# Patient Record
Sex: Female | Born: 1950 | Race: White | Hispanic: No | Marital: Married | Smoking: Former smoker
Health system: Southern US, Community
[De-identification: ages and names within clinical notes are randomized; demographics above are authoritative.]

## PROBLEM LIST (undated history)

## (undated) DIAGNOSIS — C4490 Unspecified malignant neoplasm of skin, unspecified: Secondary | ICD-10-CM

## (undated) DIAGNOSIS — S83249A Other tear of medial meniscus, current injury, unspecified knee, initial encounter: Secondary | ICD-10-CM

## (undated) DIAGNOSIS — S83289A Other tear of lateral meniscus, current injury, unspecified knee, initial encounter: Secondary | ICD-10-CM

## (undated) DIAGNOSIS — I499 Cardiac arrhythmia, unspecified: Secondary | ICD-10-CM

## (undated) DIAGNOSIS — I10 Essential (primary) hypertension: Secondary | ICD-10-CM

## (undated) DIAGNOSIS — D649 Anemia, unspecified: Secondary | ICD-10-CM

## (undated) DIAGNOSIS — R112 Nausea with vomiting, unspecified: Secondary | ICD-10-CM

## (undated) DIAGNOSIS — Z8489 Family history of other specified conditions: Secondary | ICD-10-CM

## (undated) DIAGNOSIS — G4730 Sleep apnea, unspecified: Secondary | ICD-10-CM

## (undated) DIAGNOSIS — E7800 Pure hypercholesterolemia, unspecified: Secondary | ICD-10-CM

## (undated) DIAGNOSIS — K219 Gastro-esophageal reflux disease without esophagitis: Secondary | ICD-10-CM

## (undated) DIAGNOSIS — F32A Depression, unspecified: Secondary | ICD-10-CM

## (undated) DIAGNOSIS — I493 Ventricular premature depolarization: Secondary | ICD-10-CM

## (undated) DIAGNOSIS — Z9889 Other specified postprocedural states: Secondary | ICD-10-CM

## (undated) DIAGNOSIS — M1990 Unspecified osteoarthritis, unspecified site: Secondary | ICD-10-CM

## (undated) HISTORY — PX: ESOPHAGEAL DILATION: SHX303

## (undated) HISTORY — PX: BLEPHAROPLASTY: SUR158

## (undated) HISTORY — PX: BREAST BIOPSY: SHX20

## (undated) HISTORY — PX: BREAST REDUCTION SURGERY: SHX8

## (undated) HISTORY — PX: EXCISION MORTON'S NEUROMA: SHX5013

## (undated) HISTORY — PX: SKIN CANCER EXCISION: SHX779

## (undated) HISTORY — PX: DIAGNOSTIC LAPAROSCOPY: SUR761

## (undated) HISTORY — PX: REDUCTION MAMMAPLASTY: SUR839

## (undated) HISTORY — PX: APPENDECTOMY: SHX54

---

## 1998-11-28 ENCOUNTER — Encounter: Payer: Self-pay | Admitting: Gastroenterology

## 1998-11-28 ENCOUNTER — Ambulatory Visit (HOSPITAL_COMMUNITY): Admission: RE | Admit: 1998-11-28 | Discharge: 1998-11-28 | Payer: Self-pay | Admitting: Gastroenterology

## 1999-10-02 ENCOUNTER — Other Ambulatory Visit: Admission: RE | Admit: 1999-10-02 | Discharge: 1999-10-02 | Payer: Self-pay | Admitting: Obstetrics and Gynecology

## 1999-10-09 ENCOUNTER — Encounter: Admission: RE | Admit: 1999-10-09 | Discharge: 1999-10-09 | Payer: Self-pay | Admitting: Obstetrics and Gynecology

## 1999-10-09 ENCOUNTER — Encounter: Payer: Self-pay | Admitting: Obstetrics and Gynecology

## 2000-04-03 ENCOUNTER — Encounter: Payer: Self-pay | Admitting: Obstetrics and Gynecology

## 2000-04-03 ENCOUNTER — Encounter: Admission: RE | Admit: 2000-04-03 | Discharge: 2000-04-03 | Payer: Self-pay | Admitting: Obstetrics and Gynecology

## 2000-07-19 ENCOUNTER — Encounter: Payer: Self-pay | Admitting: Neurosurgery

## 2000-07-19 ENCOUNTER — Ambulatory Visit (HOSPITAL_COMMUNITY): Admission: RE | Admit: 2000-07-19 | Discharge: 2000-07-19 | Payer: Self-pay | Admitting: Neurosurgery

## 2000-11-07 ENCOUNTER — Encounter: Payer: Self-pay | Admitting: Obstetrics and Gynecology

## 2000-11-07 ENCOUNTER — Encounter: Admission: RE | Admit: 2000-11-07 | Discharge: 2000-11-07 | Payer: Self-pay | Admitting: Obstetrics and Gynecology

## 2000-11-11 ENCOUNTER — Encounter: Payer: Self-pay | Admitting: Obstetrics and Gynecology

## 2000-11-11 ENCOUNTER — Encounter: Admission: RE | Admit: 2000-11-11 | Discharge: 2000-11-11 | Payer: Self-pay | Admitting: Obstetrics and Gynecology

## 2000-11-11 ENCOUNTER — Other Ambulatory Visit: Admission: RE | Admit: 2000-11-11 | Discharge: 2000-11-11 | Payer: Self-pay | Admitting: Obstetrics and Gynecology

## 2001-01-20 ENCOUNTER — Other Ambulatory Visit: Admission: RE | Admit: 2001-01-20 | Discharge: 2001-01-20 | Payer: Self-pay | Admitting: Obstetrics and Gynecology

## 2001-02-24 ENCOUNTER — Ambulatory Visit (HOSPITAL_BASED_OUTPATIENT_CLINIC_OR_DEPARTMENT_OTHER): Admission: RE | Admit: 2001-02-24 | Discharge: 2001-02-24 | Payer: Self-pay | Admitting: General Surgery

## 2002-03-29 ENCOUNTER — Other Ambulatory Visit: Admission: RE | Admit: 2002-03-29 | Discharge: 2002-03-29 | Payer: Self-pay | Admitting: Obstetrics and Gynecology

## 2002-07-01 HISTORY — PX: REDUCTION MAMMAPLASTY: SUR839

## 2002-12-23 ENCOUNTER — Encounter: Payer: Self-pay | Admitting: Orthopaedic Surgery

## 2002-12-23 ENCOUNTER — Ambulatory Visit (HOSPITAL_COMMUNITY): Admission: RE | Admit: 2002-12-23 | Discharge: 2002-12-23 | Payer: Self-pay | Admitting: Orthopaedic Surgery

## 2003-01-13 ENCOUNTER — Ambulatory Visit (HOSPITAL_BASED_OUTPATIENT_CLINIC_OR_DEPARTMENT_OTHER): Admission: RE | Admit: 2003-01-13 | Discharge: 2003-01-13 | Payer: Self-pay | Admitting: Orthopaedic Surgery

## 2003-07-08 ENCOUNTER — Ambulatory Visit (HOSPITAL_COMMUNITY): Admission: RE | Admit: 2003-07-08 | Discharge: 2003-07-08 | Payer: Self-pay | Admitting: Obstetrics and Gynecology

## 2003-12-16 ENCOUNTER — Ambulatory Visit (HOSPITAL_COMMUNITY): Admission: RE | Admit: 2003-12-16 | Discharge: 2003-12-16 | Payer: Self-pay | Admitting: Gastroenterology

## 2004-07-05 ENCOUNTER — Ambulatory Visit (HOSPITAL_COMMUNITY): Admission: RE | Admit: 2004-07-05 | Discharge: 2004-07-05 | Payer: Self-pay | Admitting: Plastic Surgery

## 2004-07-11 ENCOUNTER — Ambulatory Visit (HOSPITAL_COMMUNITY): Admission: RE | Admit: 2004-07-11 | Discharge: 2004-07-11 | Payer: Self-pay | Admitting: Plastic Surgery

## 2004-07-11 ENCOUNTER — Ambulatory Visit (HOSPITAL_BASED_OUTPATIENT_CLINIC_OR_DEPARTMENT_OTHER): Admission: RE | Admit: 2004-07-11 | Discharge: 2004-07-11 | Payer: Self-pay | Admitting: Plastic Surgery

## 2005-02-26 ENCOUNTER — Other Ambulatory Visit: Admission: RE | Admit: 2005-02-26 | Discharge: 2005-02-26 | Payer: Self-pay | Admitting: Obstetrics and Gynecology

## 2006-06-16 ENCOUNTER — Encounter: Admission: RE | Admit: 2006-06-16 | Discharge: 2006-06-16 | Payer: Self-pay | Admitting: Obstetrics and Gynecology

## 2010-07-22 ENCOUNTER — Encounter: Payer: Self-pay | Admitting: Obstetrics and Gynecology

## 2012-11-04 ENCOUNTER — Other Ambulatory Visit: Payer: Self-pay | Admitting: *Deleted

## 2012-11-04 DIAGNOSIS — N6310 Unspecified lump in the right breast, unspecified quadrant: Secondary | ICD-10-CM

## 2012-11-16 ENCOUNTER — Other Ambulatory Visit: Payer: Self-pay | Admitting: *Deleted

## 2012-11-16 ENCOUNTER — Ambulatory Visit
Admission: RE | Admit: 2012-11-16 | Discharge: 2012-11-16 | Disposition: A | Discharge status: Home / Self Care | Payer: BC Managed Care – PPO | Source: Ambulatory Visit | Attending: *Deleted | Admitting: *Deleted

## 2012-11-16 DIAGNOSIS — R921 Mammographic calcification found on diagnostic imaging of breast: Secondary | ICD-10-CM

## 2012-11-16 DIAGNOSIS — N6310 Unspecified lump in the right breast, unspecified quadrant: Secondary | ICD-10-CM

## 2012-11-17 ENCOUNTER — Ambulatory Visit
Admission: RE | Admit: 2012-11-17 | Discharge: 2012-11-17 | Disposition: A | Discharge status: Home / Self Care | Payer: BC Managed Care – PPO | Source: Ambulatory Visit | Attending: *Deleted | Admitting: *Deleted

## 2012-11-17 DIAGNOSIS — R921 Mammographic calcification found on diagnostic imaging of breast: Secondary | ICD-10-CM

## 2013-02-24 ENCOUNTER — Other Ambulatory Visit: Payer: Self-pay

## 2013-02-24 DIAGNOSIS — Z1231 Encounter for screening mammogram for malignant neoplasm of breast: Secondary | ICD-10-CM

## 2013-03-11 ENCOUNTER — Ambulatory Visit
Admission: RE | Admit: 2013-03-11 | Discharge: 2013-03-11 | Disposition: A | Discharge status: Home / Self Care | Payer: BC Managed Care – PPO | Source: Ambulatory Visit

## 2013-03-11 DIAGNOSIS — Z1231 Encounter for screening mammogram for malignant neoplasm of breast: Secondary | ICD-10-CM

## 2013-09-29 ENCOUNTER — Other Ambulatory Visit: Payer: Self-pay | Admitting: Obstetrics & Gynecology

## 2013-09-29 ENCOUNTER — Other Ambulatory Visit (HOSPITAL_COMMUNITY)
Admission: RE | Admit: 2013-09-29 | Discharge: 2013-09-29 | Disposition: A | Discharge status: Home / Self Care | Payer: BC Managed Care – PPO | Source: Ambulatory Visit | Attending: Obstetrics & Gynecology | Admitting: Obstetrics & Gynecology

## 2013-09-29 DIAGNOSIS — Z01419 Encounter for gynecological examination (general) (routine) without abnormal findings: Secondary | ICD-10-CM | POA: Insufficient documentation

## 2013-09-29 DIAGNOSIS — Z1151 Encounter for screening for human papillomavirus (HPV): Secondary | ICD-10-CM | POA: Insufficient documentation

## 2014-05-23 ENCOUNTER — Other Ambulatory Visit (HOSPITAL_COMMUNITY): Payer: Self-pay | Admitting: Internal Medicine

## 2014-05-23 DIAGNOSIS — R109 Unspecified abdominal pain: Secondary | ICD-10-CM

## 2014-05-24 ENCOUNTER — Ambulatory Visit (HOSPITAL_COMMUNITY)
Admission: RE | Admit: 2014-05-24 | Discharge: 2014-05-24 | Disposition: A | Discharge status: Home / Self Care | Payer: BC Managed Care – PPO | Source: Ambulatory Visit | Attending: Internal Medicine | Admitting: Internal Medicine

## 2014-05-24 DIAGNOSIS — R109 Unspecified abdominal pain: Secondary | ICD-10-CM

## 2014-08-05 ENCOUNTER — Other Ambulatory Visit: Payer: Self-pay | Admitting: Hand Surgery

## 2014-08-05 ENCOUNTER — Other Ambulatory Visit: Payer: Self-pay | Admitting: Geriatric Medicine

## 2014-08-05 DIAGNOSIS — R109 Unspecified abdominal pain: Secondary | ICD-10-CM

## 2014-08-10 ENCOUNTER — Other Ambulatory Visit (HOSPITAL_COMMUNITY): Payer: Self-pay | Admitting: Internal Medicine

## 2014-08-10 DIAGNOSIS — R1013 Epigastric pain: Secondary | ICD-10-CM

## 2014-08-11 ENCOUNTER — Other Ambulatory Visit: Payer: Self-pay

## 2014-08-17 ENCOUNTER — Ambulatory Visit (HOSPITAL_COMMUNITY)
Admission: RE | Admit: 2014-08-17 | Discharge: 2014-08-17 | Disposition: A | Discharge status: Home / Self Care | Payer: BLUE CROSS/BLUE SHIELD | Source: Ambulatory Visit | Attending: Internal Medicine | Admitting: Internal Medicine

## 2014-08-17 DIAGNOSIS — R1013 Epigastric pain: Secondary | ICD-10-CM | POA: Insufficient documentation

## 2014-08-17 MED ORDER — STERILE WATER FOR INJECTION IJ SOLN
INTRAMUSCULAR | Status: AC
  Filled 2014-08-17: qty 10

## 2014-08-17 MED ORDER — SINCALIDE 5 MCG IJ SOLR
INTRAMUSCULAR | Status: AC
  Administered 2014-08-17: 2.2 ug via INTRAVENOUS
  Filled 2014-08-17: qty 5

## 2014-08-17 MED ORDER — TECHNETIUM TC 99M MEBROFENIN IV KIT: 5.0000 | PACK | Freq: Once | INTRAVENOUS | Status: AC | PRN

## 2014-08-17 MED ORDER — SINCALIDE 5 MCG IJ SOLR: 0.0200 ug/kg | Freq: Once | INTRAMUSCULAR | Status: AC

## 2014-12-08 ENCOUNTER — Other Ambulatory Visit: Payer: Self-pay | Admitting: Obstetrics & Gynecology

## 2014-12-08 DIAGNOSIS — N6313 Unspecified lump in the right breast, lower outer quadrant: Secondary | ICD-10-CM

## 2014-12-12 ENCOUNTER — Ambulatory Visit
Admission: RE | Admit: 2014-12-12 | Discharge: 2014-12-12 | Disposition: A | Discharge status: Home / Self Care | Payer: BLUE CROSS/BLUE SHIELD | Source: Ambulatory Visit | Attending: Obstetrics & Gynecology | Admitting: Obstetrics & Gynecology

## 2014-12-12 ENCOUNTER — Other Ambulatory Visit: Payer: BLUE CROSS/BLUE SHIELD

## 2014-12-12 DIAGNOSIS — N6313 Unspecified lump in the right breast, lower outer quadrant: Secondary | ICD-10-CM

## 2015-07-26 ENCOUNTER — Other Ambulatory Visit (HOSPITAL_COMMUNITY): Payer: Self-pay | Admitting: Nurse Practitioner

## 2015-07-26 DIAGNOSIS — R200 Anesthesia of skin: Secondary | ICD-10-CM

## 2015-07-27 ENCOUNTER — Ambulatory Visit (HOSPITAL_COMMUNITY)
Admission: RE | Admit: 2015-07-27 | Discharge: 2015-07-27 | Disposition: A | Discharge status: Home / Self Care | Payer: 59 | Source: Ambulatory Visit | Attending: Internal Medicine | Admitting: Internal Medicine

## 2015-07-27 ENCOUNTER — Other Ambulatory Visit (HOSPITAL_COMMUNITY): Payer: Self-pay | Admitting: Nurse Practitioner

## 2015-07-27 DIAGNOSIS — I6523 Occlusion and stenosis of bilateral carotid arteries: Secondary | ICD-10-CM | POA: Insufficient documentation

## 2015-07-27 DIAGNOSIS — R208 Other disturbances of skin sensation: Secondary | ICD-10-CM | POA: Diagnosis not present

## 2015-07-27 DIAGNOSIS — R200 Anesthesia of skin: Secondary | ICD-10-CM | POA: Diagnosis present

## 2015-07-27 NOTE — Progress Notes (Signed)
VASCULAR LAB PRELIMINARY  PRELIMINARY  PRELIMINARY  PRELIMINARY  Carotid duplex completed.    Preliminary report:  1-39% ICA plaquing.  Vertebral artery flow is antegrade.   Anitra Doxtater, RVT 07/27/2015, 10:51 AM

## 2015-07-31 ENCOUNTER — Ambulatory Visit (INDEPENDENT_AMBULATORY_CARE_PROVIDER_SITE_OTHER): Payer: 59

## 2015-07-31 DIAGNOSIS — R002 Palpitations: Secondary | ICD-10-CM | POA: Diagnosis not present

## 2015-07-31 DIAGNOSIS — I493 Ventricular premature depolarization: Secondary | ICD-10-CM | POA: Diagnosis not present

## 2015-07-31 DIAGNOSIS — R000 Tachycardia, unspecified: Secondary | ICD-10-CM | POA: Insufficient documentation

## 2015-08-09 ENCOUNTER — Ambulatory Visit (HOSPITAL_COMMUNITY): Payer: 59

## 2015-08-09 ENCOUNTER — Ambulatory Visit (HOSPITAL_COMMUNITY)
Admission: RE | Admit: 2015-08-09 | Discharge: 2015-08-09 | Disposition: A | Discharge status: Home / Self Care | Payer: 59 | Source: Ambulatory Visit | Attending: Nurse Practitioner | Admitting: Nurse Practitioner

## 2015-08-09 DIAGNOSIS — R200 Anesthesia of skin: Secondary | ICD-10-CM

## 2015-08-11 ENCOUNTER — Other Ambulatory Visit: Payer: Self-pay | Admitting: Nurse Practitioner

## 2015-08-11 DIAGNOSIS — R200 Anesthesia of skin: Secondary | ICD-10-CM

## 2015-08-21 ENCOUNTER — Other Ambulatory Visit (HOSPITAL_COMMUNITY): Payer: Self-pay | Admitting: Internal Medicine

## 2015-08-21 DIAGNOSIS — I493 Ventricular premature depolarization: Secondary | ICD-10-CM

## 2015-08-23 ENCOUNTER — Ambulatory Visit
Admission: RE | Admit: 2015-08-23 | Discharge: 2015-08-23 | Disposition: A | Discharge status: Home / Self Care | Payer: 59 | Source: Ambulatory Visit | Attending: Nurse Practitioner | Admitting: Nurse Practitioner

## 2015-08-23 DIAGNOSIS — R200 Anesthesia of skin: Secondary | ICD-10-CM

## 2015-08-29 ENCOUNTER — Other Ambulatory Visit: Payer: Self-pay

## 2015-08-29 ENCOUNTER — Ambulatory Visit (HOSPITAL_COMMUNITY): Payer: 59 | Attending: Cardiovascular Disease

## 2015-08-29 DIAGNOSIS — I493 Ventricular premature depolarization: Secondary | ICD-10-CM | POA: Diagnosis present

## 2015-08-29 DIAGNOSIS — I517 Cardiomegaly: Secondary | ICD-10-CM | POA: Diagnosis not present

## 2015-11-09 ENCOUNTER — Other Ambulatory Visit: Payer: Self-pay

## 2015-11-09 DIAGNOSIS — Z1231 Encounter for screening mammogram for malignant neoplasm of breast: Secondary | ICD-10-CM

## 2015-12-12 ENCOUNTER — Other Ambulatory Visit (HOSPITAL_COMMUNITY)
Admission: RE | Admit: 2015-12-12 | Discharge: 2015-12-12 | Disposition: A | Discharge status: Home / Self Care | Payer: BLUE CROSS/BLUE SHIELD | Source: Ambulatory Visit | Attending: Obstetrics and Gynecology | Admitting: Obstetrics and Gynecology

## 2015-12-12 ENCOUNTER — Other Ambulatory Visit: Payer: Self-pay | Admitting: Obstetrics & Gynecology

## 2015-12-12 DIAGNOSIS — Z01419 Encounter for gynecological examination (general) (routine) without abnormal findings: Secondary | ICD-10-CM | POA: Insufficient documentation

## 2015-12-13 ENCOUNTER — Ambulatory Visit
Admission: RE | Admit: 2015-12-13 | Discharge: 2015-12-13 | Disposition: A | Discharge status: Home / Self Care | Payer: BLUE CROSS/BLUE SHIELD | Source: Ambulatory Visit

## 2015-12-13 DIAGNOSIS — Z1231 Encounter for screening mammogram for malignant neoplasm of breast: Secondary | ICD-10-CM

## 2015-12-13 LAB — CYTOLOGY - PAP

## 2016-01-30 DIAGNOSIS — S83289A Other tear of lateral meniscus, current injury, unspecified knee, initial encounter: Secondary | ICD-10-CM

## 2016-01-30 DIAGNOSIS — S83249A Other tear of medial meniscus, current injury, unspecified knee, initial encounter: Secondary | ICD-10-CM

## 2016-01-30 HISTORY — DX: Other tear of lateral meniscus, current injury, unspecified knee, initial encounter: S83.289A

## 2016-01-30 HISTORY — DX: Other tear of medial meniscus, current injury, unspecified knee, initial encounter: S83.249A

## 2016-02-22 ENCOUNTER — Encounter (HOSPITAL_BASED_OUTPATIENT_CLINIC_OR_DEPARTMENT_OTHER): Payer: Self-pay | Admitting: *Deleted

## 2016-02-22 DIAGNOSIS — Z8489 Family history of other specified conditions: Secondary | ICD-10-CM

## 2016-02-22 DIAGNOSIS — I10 Essential (primary) hypertension: Secondary | ICD-10-CM | POA: Diagnosis present

## 2016-02-22 DIAGNOSIS — K219 Gastro-esophageal reflux disease without esophagitis: Secondary | ICD-10-CM | POA: Diagnosis present

## 2016-02-22 NOTE — Pre-Procedure Instructions (Signed)
EKG and recent lab results req. from Dr. Ladell Heads office

## 2016-02-22 NOTE — H&P (Signed)
Nicole Newman is an 65 y.o. female.   Chief Complaint: left knee pain HPI: Nicole Newman is a 65 year-old seen for an acute injury to her left knee that occurred while she was on a [place] country.  Twisting injury to her knee with significant pain.  She was seen at [facility] on February 13, 2016 where x-rays and examination were consistent with a possible meniscal tear.  She underwent an MRI on February 15, 2016 that revealed a medial meniscus tear, as well as a lateral meniscus tear, as well as a ruptured Bakers cyst, as well as patellofemoral chondromalacia.  She continues to have significant pain.  Pain with weight bearing and activity, relieved by rest.  Locking, catching and popping in the knee.  She sees Dr. Lavone Orn for her regular medical care. Of note, she did have a history of PVCs earlier this year and underwent a cardiac echo which was negative and it was felt to be due to having too much caffeine in her system and this has resolved as she has cut this out of her diet.    Past Medical History:  Diagnosis Date  . Family history of adverse reaction to anesthesia    states mother had small MI during anesthesia  . GERD (gastroesophageal reflux disease)   . High cholesterol   . Hypertension    states under control with med., has been on med. x 2.5 yr.  . Lateral meniscal tear 01/2016   left  . Medial meniscus tear 01/2016   left  . PONV (postoperative nausea and vomiting)   . PVC's (premature ventricular contractions)     Past Surgical History:  Procedure Laterality Date  . APPENDECTOMY    . BREAST BIOPSY    . BREAST REDUCTION SURGERY    . DIAGNOSTIC LAPAROSCOPY    . ESOPHAGEAL DILATION    . EXCISION MORTON'S NEUROMA Right    foot    Family History  Problem Relation Age of Onset  . Anesthesia problems Mother     had small MI during anesthesia   Social History:  reports that she quit smoking about 34 years ago. She has never used smokeless tobacco. She  reports that she drinks alcohol. She reports that she does not use drugs.  Allergies:  Allergies  Allergen Reactions  . Lisinopril Cough  . Norvasc [Amlodipine] Other (See Comments)    SWELLING OF ANKLES    No prescriptions prior to admission.    No results found for this or any previous visit (from the past 48 hour(s)). No results found.  Review of Systems  Constitutional: Negative.   HENT: Negative.   Eyes: Negative.   Respiratory: Negative.   Cardiovascular: Negative.   Gastrointestinal: Negative.   Genitourinary: Negative.   Musculoskeletal: Positive for joint pain.  Skin: Negative.   Neurological: Negative.   Endo/Heme/Allergies: Negative.   Psychiatric/Behavioral: Negative.     Height 5\' 10"  (1.778 m), weight 108 kg (238 lb). Physical Exam  Constitutional: She is oriented to person, place, and time. She appears well-developed and well-nourished.  HENT:  Head: Normocephalic.  Eyes: Conjunctivae are normal. Pupils are equal, round, and reactive to light.  Neck: Neck supple.  Cardiovascular: Normal rate.   Respiratory: Effort normal.  GI: Soft.  Genitourinary:  Genitourinary Comments: Not pertinent to current symptomatology therefore not examined.  Musculoskeletal:  Examination of her left knee reveals pain on the medial joint line.  Positive medial McMurray's.  1+ effusion.  Full range of motion.  Knee is stable with normal patella tracking.  She does have mild tenderness posteriorly with negative Homans sign.  Examination of her right knee reveals full range of motion without pain, swelling, weakness or instability.    Neurological: She is alert and oriented to person, place, and time.  Skin: Skin is warm and dry.  Psychiatric: She has a normal mood and affect.     Assessment Principal Problem:   Medial meniscus tear Active Problems:   Ventricular premature beats   Lateral meniscal tear left knee   Hypertension   GERD (gastroesophageal reflux disease)    Family history of adverse reaction to anesthesia   Plan Left knee arthroscopy with partial medial and lateral meniscectomy.  The risks, benefits, and possible complications of the procedure were discussed in detail with the patient.  The patient is without question.  Linda Hedges, PA-C 02/22/2016, 3:15 PM

## 2016-02-27 ENCOUNTER — Encounter (HOSPITAL_BASED_OUTPATIENT_CLINIC_OR_DEPARTMENT_OTHER): Payer: Self-pay | Admitting: *Deleted

## 2016-02-27 ENCOUNTER — Ambulatory Visit (HOSPITAL_BASED_OUTPATIENT_CLINIC_OR_DEPARTMENT_OTHER): Payer: BLUE CROSS/BLUE SHIELD | Admitting: Anesthesiology

## 2016-02-27 ENCOUNTER — Encounter (HOSPITAL_BASED_OUTPATIENT_CLINIC_OR_DEPARTMENT_OTHER)
Admission: RE | Disposition: A | Discharge status: Home / Self Care | Payer: Self-pay | Source: Ambulatory Visit | Attending: Orthopedic Surgery

## 2016-02-27 ENCOUNTER — Ambulatory Visit (HOSPITAL_BASED_OUTPATIENT_CLINIC_OR_DEPARTMENT_OTHER)
Admission: RE | Admit: 2016-02-27 | Discharge: 2016-02-27 | Disposition: A | Discharge status: Home / Self Care | Payer: BLUE CROSS/BLUE SHIELD | Source: Ambulatory Visit | Attending: Orthopedic Surgery | Admitting: Orthopedic Surgery

## 2016-02-27 DIAGNOSIS — S83282A Other tear of lateral meniscus, current injury, left knee, initial encounter: Secondary | ICD-10-CM | POA: Insufficient documentation

## 2016-02-27 DIAGNOSIS — X501XXA Overexertion from prolonged static or awkward postures, initial encounter: Secondary | ICD-10-CM | POA: Diagnosis not present

## 2016-02-27 DIAGNOSIS — M2242 Chondromalacia patellae, left knee: Secondary | ICD-10-CM | POA: Diagnosis not present

## 2016-02-27 DIAGNOSIS — I10 Essential (primary) hypertension: Secondary | ICD-10-CM | POA: Diagnosis present

## 2016-02-27 DIAGNOSIS — Z79899 Other long term (current) drug therapy: Secondary | ICD-10-CM | POA: Diagnosis not present

## 2016-02-27 DIAGNOSIS — Z8489 Family history of other specified conditions: Secondary | ICD-10-CM

## 2016-02-27 DIAGNOSIS — S83249A Other tear of medial meniscus, current injury, unspecified knee, initial encounter: Secondary | ICD-10-CM | POA: Diagnosis present

## 2016-02-27 DIAGNOSIS — S83289A Other tear of lateral meniscus, current injury, unspecified knee, initial encounter: Secondary | ICD-10-CM | POA: Diagnosis present

## 2016-02-27 DIAGNOSIS — E7800 Pure hypercholesterolemia, unspecified: Secondary | ICD-10-CM | POA: Diagnosis not present

## 2016-02-27 DIAGNOSIS — S83242A Other tear of medial meniscus, current injury, left knee, initial encounter: Secondary | ICD-10-CM | POA: Diagnosis present

## 2016-02-27 DIAGNOSIS — I493 Ventricular premature depolarization: Secondary | ICD-10-CM | POA: Diagnosis present

## 2016-02-27 DIAGNOSIS — Z7982 Long term (current) use of aspirin: Secondary | ICD-10-CM | POA: Insufficient documentation

## 2016-02-27 DIAGNOSIS — Z87891 Personal history of nicotine dependence: Secondary | ICD-10-CM | POA: Diagnosis not present

## 2016-02-27 DIAGNOSIS — K219 Gastro-esophageal reflux disease without esophagitis: Secondary | ICD-10-CM | POA: Diagnosis present

## 2016-02-27 HISTORY — PX: CHONDROPLASTY: SHX5177

## 2016-02-27 HISTORY — DX: Pure hypercholesterolemia, unspecified: E78.00

## 2016-02-27 HISTORY — DX: Ventricular premature depolarization: I49.3

## 2016-02-27 HISTORY — DX: Other specified postprocedural states: R11.2

## 2016-02-27 HISTORY — DX: Other tear of medial meniscus, current injury, unspecified knee, initial encounter: S83.249A

## 2016-02-27 HISTORY — PX: KNEE ARTHROSCOPY WITH LATERAL MENISECTOMY: SHX6193

## 2016-02-27 HISTORY — DX: Gastro-esophageal reflux disease without esophagitis: K21.9

## 2016-02-27 HISTORY — DX: Other tear of lateral meniscus, current injury, unspecified knee, initial encounter: S83.289A

## 2016-02-27 HISTORY — DX: Other specified postprocedural states: Z98.890

## 2016-02-27 HISTORY — DX: Family history of other specified conditions: Z84.89

## 2016-02-27 HISTORY — DX: Essential (primary) hypertension: I10

## 2016-02-27 SURGERY — ARTHROSCOPY, KNEE, WITH LATERAL MENISCECTOMY
Anesthesia: General | Site: Knee | Laterality: Left

## 2016-02-27 MED ORDER — HYDROMORPHONE HCL 1 MG/ML IJ SOLN
INTRAMUSCULAR | Status: AC
  Filled 2016-02-27: qty 1

## 2016-02-27 MED ORDER — DEXAMETHASONE SODIUM PHOSPHATE 10 MG/ML IJ SOLN
INTRAMUSCULAR | Status: AC
  Filled 2016-02-27: qty 1

## 2016-02-27 MED ORDER — FENTANYL CITRATE (PF) 100 MCG/2ML IJ SOLN
50.0000 ug | INTRAMUSCULAR | Status: DC | PRN
  Administered 2016-02-27: 25 ug via INTRAVENOUS
  Administered 2016-02-27: 100 ug via INTRAVENOUS

## 2016-02-27 MED ORDER — OXYCODONE HCL 5 MG PO TABS
5.0000 mg | ORAL_TABLET | Freq: Once | ORAL | Status: AC | PRN
  Administered 2016-02-27: 5 mg via ORAL

## 2016-02-27 MED ORDER — ONDANSETRON HCL 4 MG/2ML IJ SOLN
INTRAMUSCULAR | Status: DC | PRN
  Administered 2016-02-27: 4 mg via INTRAVENOUS

## 2016-02-27 MED ORDER — OXYCODONE HCL 5 MG PO TABS
ORAL_TABLET | ORAL | Status: AC
  Filled 2016-02-27: qty 1

## 2016-02-27 MED ORDER — SCOPOLAMINE 1 MG/3DAYS TD PT72: 1.0000 | MEDICATED_PATCH | Freq: Once | TRANSDERMAL | Status: DC | PRN

## 2016-02-27 MED ORDER — ONDANSETRON HCL 4 MG/2ML IJ SOLN
INTRAMUSCULAR | Status: AC
  Filled 2016-02-27: qty 2

## 2016-02-27 MED ORDER — LACTATED RINGERS IV SOLN
INTRAVENOUS | Status: DC
  Administered 2016-02-27: 11:00:00 via INTRAVENOUS

## 2016-02-27 MED ORDER — MEPERIDINE HCL 25 MG/ML IJ SOLN: 6.2500 mg | INTRAMUSCULAR | Status: DC | PRN

## 2016-02-27 MED ORDER — HYDROCODONE-ACETAMINOPHEN 5-325 MG PO TABS: ORAL_TABLET | ORAL | 0 refills | Status: DC

## 2016-02-27 MED ORDER — GLYCOPYRROLATE 0.2 MG/ML IJ SOLN: 0.2000 mg | Freq: Once | INTRAMUSCULAR | Status: DC | PRN

## 2016-02-27 MED ORDER — MIDAZOLAM HCL 2 MG/2ML IJ SOLN
INTRAMUSCULAR | Status: AC
  Filled 2016-02-27: qty 2

## 2016-02-27 MED ORDER — POVIDONE-IODINE 7.5 % EX SOLN: Freq: Once | CUTANEOUS | Status: DC

## 2016-02-27 MED ORDER — ONDANSETRON HCL 4 MG/2ML IJ SOLN: 4.0000 mg | Freq: Once | INTRAMUSCULAR | Status: DC | PRN

## 2016-02-27 MED ORDER — MIDAZOLAM HCL 2 MG/2ML IJ SOLN
1.0000 mg | INTRAMUSCULAR | Status: DC | PRN
  Administered 2016-02-27: 2 mg via INTRAVENOUS

## 2016-02-27 MED ORDER — PROPOFOL 10 MG/ML IV BOLUS
INTRAVENOUS | Status: DC | PRN
  Administered 2016-02-27: 200 mg via INTRAVENOUS

## 2016-02-27 MED ORDER — FENTANYL CITRATE (PF) 100 MCG/2ML IJ SOLN
INTRAMUSCULAR | Status: AC
  Filled 2016-02-27: qty 2

## 2016-02-27 MED ORDER — PROPOFOL 10 MG/ML IV BOLUS
INTRAVENOUS | Status: AC
  Filled 2016-02-27: qty 20

## 2016-02-27 MED ORDER — OXYCODONE HCL 5 MG/5ML PO SOLN: 5.0000 mg | Freq: Once | ORAL | Status: AC | PRN

## 2016-02-27 MED ORDER — BUPIVACAINE-EPINEPHRINE (PF) 0.25% -1:200000 IJ SOLN
INTRAMUSCULAR | Status: AC
  Filled 2016-02-27: qty 30

## 2016-02-27 MED ORDER — DEXAMETHASONE SODIUM PHOSPHATE 4 MG/ML IJ SOLN
INTRAMUSCULAR | Status: DC | PRN
  Administered 2016-02-27: 10 mg via INTRAVENOUS

## 2016-02-27 MED ORDER — HYDROMORPHONE HCL 1 MG/ML IJ SOLN
0.2500 mg | INTRAMUSCULAR | Status: DC | PRN
  Administered 2016-02-27 (×2): 0.5 mg via INTRAVENOUS

## 2016-02-27 MED ORDER — CEFAZOLIN SODIUM-DEXTROSE 2-4 GM/100ML-% IV SOLN
2.0000 g | INTRAVENOUS | Status: AC
  Administered 2016-02-27: 2 g via INTRAVENOUS

## 2016-02-27 MED ORDER — LIDOCAINE 2% (20 MG/ML) 5 ML SYRINGE
INTRAMUSCULAR | Status: DC | PRN
  Administered 2016-02-27: 80 mg via INTRAVENOUS

## 2016-02-27 MED ORDER — LACTATED RINGERS IV SOLN
INTRAVENOUS | Status: DC
  Administered 2016-02-27: 12:00:00 via INTRAVENOUS

## 2016-02-27 MED ORDER — LIDOCAINE 2% (20 MG/ML) 5 ML SYRINGE
INTRAMUSCULAR | Status: AC
  Filled 2016-02-27: qty 5

## 2016-02-27 MED ORDER — CEFAZOLIN SODIUM-DEXTROSE 2-4 GM/100ML-% IV SOLN
INTRAVENOUS | Status: AC
  Filled 2016-02-27: qty 100

## 2016-02-27 SURGICAL SUPPLY — 45 items
BANDAGE ACE 6X5 VEL STRL LF (GAUZE/BANDAGES/DRESSINGS) ×3 IMPLANT
BLADE CUDA GRT WHITE 3.5 (BLADE) IMPLANT
BLADE CUTTER GATOR 3.5 (BLADE) ×3 IMPLANT
BLADE GREAT WHITE 4.2 (BLADE) IMPLANT
BLADE GREAT WHITE 4.2MM (BLADE)
BLADE SURG 15 STRL LF DISP TIS (BLADE) IMPLANT
BLADE SURG 15 STRL SS (BLADE)
BNDG COHESIVE 4X5 TAN STRL (GAUZE/BANDAGES/DRESSINGS) IMPLANT
DRAPE ARTHROSCOPY W/POUCH 90 (DRAPES) ×3 IMPLANT
DURAPREP 26ML APPLICATOR (WOUND CARE) ×3 IMPLANT
GAUZE SPONGE 4X4 12PLY STRL (GAUZE/BANDAGES/DRESSINGS) ×3 IMPLANT
GAUZE XEROFORM 1X8 LF (GAUZE/BANDAGES/DRESSINGS) ×3 IMPLANT
GLOVE BIO SURGEON STRL SZ 6.5 (GLOVE) ×2 IMPLANT
GLOVE BIO SURGEON STRL SZ7 (GLOVE) ×3 IMPLANT
GLOVE BIO SURGEONS STRL SZ 6.5 (GLOVE) ×1
GLOVE BIOGEL PI IND STRL 7.0 (GLOVE) ×3 IMPLANT
GLOVE BIOGEL PI IND STRL 7.5 (GLOVE) ×1 IMPLANT
GLOVE BIOGEL PI INDICATOR 7.0 (GLOVE) ×6
GLOVE BIOGEL PI INDICATOR 7.5 (GLOVE) ×2
GLOVE SS BIOGEL STRL SZ 7.5 (GLOVE) ×1 IMPLANT
GLOVE SUPERSENSE BIOGEL SZ 7.5 (GLOVE) ×2
GOWN STRL REUS W/ TWL LRG LVL3 (GOWN DISPOSABLE) ×3 IMPLANT
GOWN STRL REUS W/TWL LRG LVL3 (GOWN DISPOSABLE) ×9
HOLDER KNEE FOAM BLUE (MISCELLANEOUS) ×3 IMPLANT
KNEE WRAP E Z 3 GEL PACK (MISCELLANEOUS) ×3 IMPLANT
MANIFOLD NEPTUNE II (INSTRUMENTS) IMPLANT
NDL SAFETY ECLIPSE 18X1.5 (NEEDLE) ×2 IMPLANT
NEEDLE HYPO 18GX1.5 SHARP (NEEDLE) ×6
NEEDLE HYPO 22GX1.5 SAFETY (NEEDLE) IMPLANT
PACK ARTHROSCOPY DSU (CUSTOM PROCEDURE TRAY) ×3 IMPLANT
PACK BASIN DAY SURGERY FS (CUSTOM PROCEDURE TRAY) ×3 IMPLANT
PAD ALCOHOL SWAB (MISCELLANEOUS) IMPLANT
SET ARTHROSCOPY TUBING (MISCELLANEOUS) ×2
SET ARTHROSCOPY TUBING LN (MISCELLANEOUS) ×1 IMPLANT
SUCTION FRAZIER HANDLE 10FR (MISCELLANEOUS)
SUCTION TUBE FRAZIER 10FR DISP (MISCELLANEOUS) IMPLANT
SUT ETHILON 4 0 PS 2 18 (SUTURE) ×3 IMPLANT
SUT PROLENE 3 0 PS 2 (SUTURE) IMPLANT
SUT VIC AB 3-0 PS1 18 (SUTURE)
SUT VIC AB 3-0 PS1 18XBRD (SUTURE) IMPLANT
SYR 20CC LL (SYRINGE) IMPLANT
SYR 5ML LL (SYRINGE) ×3 IMPLANT
TOWEL OR 17X24 6PK STRL BLUE (TOWEL DISPOSABLE) ×3 IMPLANT
WAND STAR VAC 90 (SURGICAL WAND) IMPLANT
WATER STERILE IRR 1000ML POUR (IV SOLUTION) ×3 IMPLANT

## 2016-02-27 NOTE — Interval H&P Note (Signed)
History and Physical Interval Note:  02/27/2016 11:18 AM  Nicole Newman  has presented today for surgery, with the diagnosis of tear of medial and lateral meniscus left  The various methods of treatment have been discussed with the patient and family. After consideration of risks, benefits and other options for treatment, the patient has consented to  Procedure(s): KNEE ARTHROSCOPY WITH LATERAL and MEDIAL MENISCECTOMY, left (Left) as a surgical intervention .  The patient's history has been reviewed, patient examined, no change in status, stable for surgery.  I have reviewed the patient's chart and labs.  Questions were answered to the patient's satisfaction.     Elsie Saas A

## 2016-02-27 NOTE — Progress Notes (Signed)
Assisted Dr. Crews with left, knee block. Side rails up, monitors on throughout procedure. See vital signs in flow sheet. Tolerated Procedure well. 

## 2016-02-27 NOTE — Anesthesia Postprocedure Evaluation (Signed)
Anesthesia Post Note  Patient: Cortney Mccurley Sayas  Procedure(s) Performed: Procedure(s) (LRB): KNEE ARTHROSCOPY WITH LATERAL and MEDIAL MENISCECTOMY, left (Left) CHONDROPLASTY (Left)  Patient location during evaluation: PACU Anesthesia Type: General Level of consciousness: awake and alert Pain management: pain level controlled Vital Signs Assessment: post-procedure vital signs reviewed and stable Respiratory status: spontaneous breathing, nonlabored ventilation and respiratory function stable Cardiovascular status: blood pressure returned to baseline and stable Postop Assessment: no signs of nausea or vomiting Anesthetic complications: no    Last Vitals:  Vitals:   02/27/16 1330 02/27/16 1355  BP: 132/77 139/64  Pulse: (!) 55 66  Resp: 16 16  Temp:  36.6 C    Last Pain:  Vitals:   02/27/16 1355  TempSrc: Oral  PainSc: 4                  Burk Hoctor A

## 2016-02-27 NOTE — Transfer of Care (Signed)
Immediate Anesthesia Transfer of Care Note  Patient: Nicole Newman  Procedure(s) Performed: Procedure(s): KNEE ARTHROSCOPY WITH LATERAL and MEDIAL MENISCECTOMY, left (Left) CHONDROPLASTY (Left)  Patient Location: PACU  Anesthesia Type:GA combined with regional for post-op pain  Level of Consciousness: awake, sedated and patient cooperative  Airway & Oxygen Therapy: Patient Spontanous Breathing and Patient connected to face mask oxygen  Post-op Assessment: Report given to RN and Post -op Vital signs reviewed and stable  Post vital signs: Reviewed and stable  Last Vitals:  Vitals:   02/27/16 1145 02/27/16 1148  BP: (!) 148/86 132/78  Pulse: 69 65  Resp: 19 12  Temp:      Last Pain:  Vitals:   02/27/16 1030  TempSrc: Oral  PainSc:          Complications: No apparent anesthesia complications

## 2016-02-27 NOTE — Discharge Instructions (Signed)
°  Post Anesthesia Home Care Instructions ° °Activity: °Get plenty of rest for the remainder of the day. A responsible adult should stay with you for 24 hours following the procedure.  °For the next 24 hours, DO NOT: °-Drive a car °-Operate machinery °-Drink alcoholic beverages °-Take any medication unless instructed by your physician °-Make any legal decisions or sign important papers. ° °Meals: °Start with liquid foods such as gelatin or soup. Progress to regular foods as tolerated. Avoid greasy, spicy, heavy foods. If nausea and/or vomiting occur, drink only clear liquids until the nausea and/or vomiting subsides. Call your physician if vomiting continues. ° °Special Instructions/Symptoms: °Your throat may feel dry or sore from the anesthesia or the breathing tube placed in your throat during surgery. If this causes discomfort, gargle with warm salt water. The discomfort should disappear within 24 hours. ° °If you had a scopolamine patch placed behind your ear for the management of post- operative nausea and/or vomiting: ° °1. The medication in the patch is effective for 72 hours, after which it should be removed.  Wrap patch in a tissue and discard in the trash. Wash hands thoroughly with soap and water. °2. You may remove the patch earlier than 72 hours if you experience unpleasant side effects which may include dry mouth, dizziness or visual disturbances. °3. Avoid touching the patch. Wash your hands with soap and water after contact with the patch. °  °Regional Anesthesia Blocks ° °1. Numbness or the inability to move the "blocked" extremity may last from 3-48 hours after placement. The length of time depends on the medication injected and your individual response to the medication. If the numbness is not going away after 48 hours, call your surgeon. ° °2. The extremity that is blocked will need to be protected until the numbness is gone and the  Strength has returned. Because you cannot feel it, you will need  to take extra care to avoid injury. Because it may be weak, you may have difficulty moving it or using it. You may not know what position it is in without looking at it while the block is in effect. ° °3. For blocks in the legs and feet, returning to weight bearing and walking needs to be done carefully. You will need to wait until the numbness is entirely gone and the strength has returned. You should be able to move your leg and foot normally before you try and bear weight or walk. You will need someone to be with you when you first try to ensure you do not fall and possibly risk injury. ° °4. Bruising and tenderness at the needle site are common side effects and will resolve in a few days. ° °5. Persistent numbness or new problems with movement should be communicated to the surgeon or the [facility] ([phone])/ [facility] ([phone]). °

## 2016-02-27 NOTE — Anesthesia Preprocedure Evaluation (Signed)
Anesthesia Evaluation  Patient identified by MRN, date of birth, ID band Patient awake    Reviewed: Allergy & Precautions, NPO status , Patient's Chart, lab work & pertinent test results  Airway Mallampati: I  TM Distance: >3 FB Neck ROM: Full    Dental  (+) Teeth Intact, Dental Advisory Given   Pulmonary former smoker,    breath sounds clear to auscultation       Cardiovascular hypertension, Pt. on medications  Rhythm:Regular Rate:Normal     Neuro/Psych    GI/Hepatic GERD  Medicated and Controlled,  Endo/Other  Morbid obesity  Renal/GU      Musculoskeletal   Abdominal   Peds  Hematology   Anesthesia Other Findings   Reproductive/Obstetrics                             Anesthesia Physical Anesthesia Plan  ASA: III  Anesthesia Plan: General   Post-op Pain Management:    Induction: Intravenous  Airway Management Planned: LMA  Additional Equipment:   Intra-op Plan:   Post-operative Plan: Extubation in OR  Informed Consent: I have reviewed the patients History and Physical, chart, labs and discussed the procedure including the risks, benefits and alternatives for the proposed anesthesia with the patient or authorized representative who has indicated his/her understanding and acceptance.   Dental advisory given  Plan Discussed with: CRNA, Anesthesiologist and Surgeon  Anesthesia Plan Comments:         Anesthesia Quick Evaluation

## 2016-02-27 NOTE — Anesthesia Procedure Notes (Signed)
Anesthesia Procedure Note Left Knee intraarticular injection:  Sterile prep, 22g needle to 2 lower port sites, 10 ml of 0.25% Marcaine with 1:200000 Epi given in skin and into the joint itself at each port.  Tolerated well.

## 2016-02-27 NOTE — Anesthesia Procedure Notes (Signed)
Procedure Name: LMA Insertion Date/Time: 02/27/2016 12:12 PM Performed by: Lyndee Leo Pre-anesthesia Checklist: Patient identified, Emergency Drugs available, Suction available and Patient being monitored Patient Re-evaluated:Patient Re-evaluated prior to inductionOxygen Delivery Method: Circle System Utilized Preoxygenation: Pre-oxygenation with 100% oxygen Intubation Type: IV induction Ventilation: Mask ventilation without difficulty LMA: LMA with gastric port inserted LMA Size: 4.0 Number of attempts: 1 Placement Confirmation: positive ETCO2 Tube secured with: Tape Dental Injury: Teeth and Oropharynx as per pre-operative assessment

## 2016-02-28 ENCOUNTER — Encounter (HOSPITAL_BASED_OUTPATIENT_CLINIC_OR_DEPARTMENT_OTHER): Payer: Self-pay | Admitting: Orthopedic Surgery

## 2016-02-28 NOTE — Addendum Note (Signed)
Addendum  created 02/28/16 1152 by Tawni Millers, CRNA   Charge Capture section accepted

## 2016-02-28 NOTE — Op Note (Signed)
NAMERHYLAN, BUOL                ACCOUNT NO.:  [id number]  MEDICAL RECORD NO.:  [id number]  LOCATION:                                 FACILITY:  PHYSICIAN:  Helios Kohlmann A. Noemi Chapel, M.D. DATE OF BIRTH:  Oct 20, 1950  DATE OF PROCEDURE:  02/27/2016 DATE OF DISCHARGE:                              OPERATIVE REPORT   PREOPERATIVE DIAGNOSES: 1. Left knee acute traumatic medial and lateral meniscal tears. 2. Left knee chondromalacia.  POSTOPERATIVE DIAGNOSES: 1. Left knee acute traumatic medial and lateral meniscal tears. 2. Left knee chondromalacia.  PROCEDURE: 1. Left knee EUA, followed by arthroscopic partial medial and lateral     meniscectomies. 2. Left knee chondroplasty.  SURGEON:  Audree Camel. Noemi Chapel, M.D.  ASSISTANT:  Kirstin Shepperson, PA-C.  ANESTHESIA:  General.  OPERATIVE TIME:  30 minutes.  COMPLICATIONS:  None.  INDICATION FOR PROCEDURE:  Ms. Banford is a 65 year old woman, who had a twisting injury to her left knee approximately a month ago.  Exam and MRIs revealed medial and lateral meniscal tears with a ruptured Baker cyst and chondromalacia.  She has failed conservative care and is now to undergo arthroscopy.  DESCRIPTION:  Ms. Damm was brought to the operating room on February 27, 2016, after knee block was placed in the holding room by Anesthesia. She was placed on the table supine position.  She received antibiotics preoperatively for prophylaxis.  After being placed under general anesthesia, her left knee was examined.  She had full range of motion. Knee was stable.  Ligamentous exam with normal patellar tracking.  Left leg was prepped using sterile DuraPrep and draped using sterile technique.  Time-out procedure was called, and the correct left knee identified.  Initially, through an anterolateral portal, the arthroscope with a pump attached was placed into an anteromedial portal, an arthroscopic probe was placed.  On initial inspection of  medial compartment, the articular cartilage showed 50-60% grade 3 chondromalacia, which was debrided.  Medial meniscus showed tearing of the posterior and medial horn of which 30-40% was resected back to a stable rim.  Intercondylar notch inspected, anterior and posterior cruciate ligaments were normal.  Lateral compartment inspected.  She had 30% grade 3 chondromalacia, which was debrided.  Lateral meniscus tear 30-40% posterior and lateral horn which was resected back to a stable rim.  Patellofemoral joint showed 50% grade 3 chondromalacia, which was debrided.  The patella tracked normally.  Medial and lateral gutters were free of pathology.  After this done, it was felt that all pathology had been satisfactorily addressed.  The instruments removed.  Portals closed with 3-0 nylon suture.  Sterile dressings were applied, and the patient awakened and taken to recovery in stable condition.  FOLLOWUP CARE:  Ms. Melle will be followed as an outpatient on Norco for pain.  Seen back in the office in a week for sutures out and followup.     Ebb Carelock A. Noemi Chapel, M.D.   ______________________________ Audree Camel. Noemi Chapel, M.D.    RAW/MEDQ  D:  02/27/2016  T:  02/28/2016  Job:  [id number]

## 2016-12-03 ENCOUNTER — Other Ambulatory Visit: Payer: Self-pay | Admitting: Internal Medicine

## 2016-12-03 DIAGNOSIS — Z1231 Encounter for screening mammogram for malignant neoplasm of breast: Secondary | ICD-10-CM

## 2016-12-17 ENCOUNTER — Ambulatory Visit: Payer: BLUE CROSS/BLUE SHIELD

## 2016-12-19 ENCOUNTER — Ambulatory Visit
Admission: RE | Admit: 2016-12-19 | Discharge: 2016-12-19 | Disposition: A | Discharge status: Home / Self Care | Payer: BLUE CROSS/BLUE SHIELD | Source: Ambulatory Visit | Attending: Internal Medicine | Admitting: Internal Medicine

## 2016-12-19 DIAGNOSIS — Z1231 Encounter for screening mammogram for malignant neoplasm of breast: Secondary | ICD-10-CM

## 2018-01-21 ENCOUNTER — Other Ambulatory Visit: Payer: Self-pay | Admitting: Internal Medicine

## 2018-01-21 DIAGNOSIS — Z1231 Encounter for screening mammogram for malignant neoplasm of breast: Secondary | ICD-10-CM

## 2018-02-12 ENCOUNTER — Ambulatory Visit
Admission: RE | Admit: 2018-02-12 | Discharge: 2018-02-12 | Disposition: A | Discharge status: Home / Self Care | Payer: BC Managed Care – PPO | Source: Ambulatory Visit | Attending: Internal Medicine | Admitting: Internal Medicine

## 2018-02-12 DIAGNOSIS — Z1231 Encounter for screening mammogram for malignant neoplasm of breast: Secondary | ICD-10-CM

## 2018-04-15 ENCOUNTER — Other Ambulatory Visit: Payer: Self-pay | Admitting: Internal Medicine

## 2018-04-15 DIAGNOSIS — Z13820 Encounter for screening for osteoporosis: Secondary | ICD-10-CM

## 2018-06-19 ENCOUNTER — Ambulatory Visit
Admission: RE | Admit: 2018-06-19 | Discharge: 2018-06-19 | Disposition: A | Discharge status: Home / Self Care | Payer: BC Managed Care – PPO | Source: Ambulatory Visit | Attending: Internal Medicine | Admitting: Internal Medicine

## 2018-06-19 DIAGNOSIS — Z13820 Encounter for screening for osteoporosis: Secondary | ICD-10-CM

## 2018-06-29 ENCOUNTER — Other Ambulatory Visit: Payer: Self-pay

## 2018-06-29 ENCOUNTER — Encounter
Admission: RE | Admit: 2018-06-29 | Discharge: 2018-06-29 | Disposition: A | Discharge status: Home / Self Care | Payer: BC Managed Care – PPO | Source: Ambulatory Visit | Attending: Obstetrics & Gynecology | Admitting: Obstetrics & Gynecology

## 2018-06-29 HISTORY — DX: Cardiac arrhythmia, unspecified: I49.9

## 2018-06-29 NOTE — Patient Instructions (Signed)
Your procedure is scheduled on: 07/06/18 Report to Day Surgery. [facility]OND FLOOR To find out your arrival time please call [phone] between 1PM - 3PM on 07/03/18.  Remember: Instructions that are not followed completely may result in serious medical risk,  up to and including death, or upon the discretion of your surgeon and anesthesiologist your  surgery may need to be rescheduled.     _X__ 1. Do not eat food after midnight the night before your procedure.                 No gum chewing or hard candies. You may drink clear liquids up to 2 hours                 before you are scheduled to arrive for your surgery- DO not drink clear                 liquids within 2 hours of the start of your surgery.                 Clear Liquids include:  water, apple juice without pulp, clear carbohydrate                 drink such as Clearfast of Gatorade, Black Coffee or Tea (Do not add                 anything to coffee or tea).  __X__2.  On the morning of surgery brush your teeth with toothpaste and water, you                may rinse your mouth with mouthwash if you wish.  Do not swallow any toothpaste of mouthwash.     _X__ 3.  No Alcohol for 24 hours before or after surgery.   _X__ 4.  Do Not Smoke or use e-cigarettes For 24 Hours Prior to Your Surgery.                 Do not use any chewable tobacco products for at least 6 hours prior to                 surgery.  ____  5.  Bring all medications with you on the day of surgery if instructed.   ____  6.  Notify your doctor if there is any change in your medical condition      (cold, fever, infections).     Do not wear jewelry, make-up, hairpins, clips or nail polish. Do not wear lotions, powders, or perfumes. You may wear deodorant. Do not shave 48 hours prior to surgery. Men may shave face and neck. Do not bring valuables to the hospital.    [facility] is not responsible for any belongings or  valuables.  Contacts, dentures or bridgework may not be worn into surgery. Leave your suitcase in the car. After surgery it may be brought to your room. For patients admitted to the hospital, discharge time is determined by your treatment team.   Patients discharged the day of surgery will not be allowed to drive home.   Please read over the following fact sheets that you were given:   Surgical Site Infection Prevention   _X___ Take these medicines the morning of surgery with A SIP OF WATER:    1. PEPCID BEDTIME 07/05/18 AND AM OF SURGERY  2.   3.   4.  5.  6.  ____ Fleet Enema (as directed)   __X__  Use CHG Soap as directed  ____ Use inhalers on the day of surgery  ____ Stop metformin 2 days prior to surgery    ____ Take 1/2 of usual insulin dose the night before surgery. No insulin the morning          of surgery.   ____ Stop Coumadin/Plavix/aspirin on   ____ Stop Anti-inflammatories on    ____ Stop supplements until after surgery.    _X___ Bring C-Pap to the hospital.

## 2018-07-02 ENCOUNTER — Encounter
Admission: RE | Admit: 2018-07-02 | Discharge: 2018-07-02 | Disposition: A | Discharge status: Home / Self Care | Payer: BC Managed Care – PPO | Source: Ambulatory Visit | Attending: Obstetrics & Gynecology | Admitting: Obstetrics & Gynecology

## 2018-07-02 DIAGNOSIS — R001 Bradycardia, unspecified: Secondary | ICD-10-CM | POA: Insufficient documentation

## 2018-07-02 DIAGNOSIS — Z01818 Encounter for other preprocedural examination: Secondary | ICD-10-CM | POA: Diagnosis present

## 2018-07-02 DIAGNOSIS — I10 Essential (primary) hypertension: Secondary | ICD-10-CM | POA: Diagnosis not present

## 2018-07-02 LAB — TYPE AND SCREEN
ABO/RH(D): O POS
ANTIBODY SCREEN: NEGATIVE

## 2018-07-02 LAB — CBC
HEMATOCRIT: 41.3 % (ref 36.0–46.0)
Hemoglobin: 13.1 g/dL (ref 12.0–15.0)
MCH: 28.9 pg (ref 26.0–34.0)
MCHC: 31.7 g/dL (ref 30.0–36.0)
MCV: 91.2 fL (ref 80.0–100.0)
Platelets: 246 10*3/uL (ref 150–400)
RBC: 4.53 MIL/uL (ref 3.87–5.11)
RDW: 13.6 % (ref 11.5–15.5)
WBC: 3 10*3/uL — AB (ref 4.0–10.5)
nRBC: 0 % (ref 0.0–0.2)

## 2018-07-02 LAB — BASIC METABOLIC PANEL
Anion gap: 6 (ref 5–15)
BUN: 13 mg/dL (ref 8–23)
CHLORIDE: 106 mmol/L (ref 98–111)
CO2: 27 mmol/L (ref 22–32)
CREATININE: 0.7 mg/dL (ref 0.44–1.00)
Calcium: 9 mg/dL (ref 8.9–10.3)
GFR calc Af Amer: >60 mL/min (ref >60)
GFR calc non Af Amer: >60 mL/min (ref >60)
GLUCOSE: 90 mg/dL (ref 70–99)
Potassium: 4 mmol/L (ref 3.5–5.1)
SODIUM: 139 mmol/L (ref 135–145)

## 2018-07-02 NOTE — Pre-Procedure Instructions (Signed)
EKG OK BY DR Lenna Sciara ADAMS

## 2018-07-06 ENCOUNTER — Encounter: Payer: Self-pay | Admitting: *Deleted

## 2018-07-06 ENCOUNTER — Ambulatory Visit
Admission: RE | Admit: 2018-07-06 | Discharge: 2018-07-06 | Disposition: A | Discharge status: Home / Self Care | Payer: BC Managed Care – PPO | Attending: Obstetrics & Gynecology | Admitting: Obstetrics & Gynecology

## 2018-07-06 ENCOUNTER — Encounter
Admission: RE | Disposition: A | Discharge status: Home / Self Care | Payer: Self-pay | Source: Home / Self Care | Attending: Obstetrics & Gynecology

## 2018-07-06 ENCOUNTER — Other Ambulatory Visit: Payer: Self-pay

## 2018-07-06 ENCOUNTER — Ambulatory Visit: Payer: BC Managed Care – PPO | Admitting: Anesthesiology

## 2018-07-06 DIAGNOSIS — I10 Essential (primary) hypertension: Secondary | ICD-10-CM | POA: Diagnosis not present

## 2018-07-06 DIAGNOSIS — N950 Postmenopausal bleeding: Secondary | ICD-10-CM | POA: Diagnosis not present

## 2018-07-06 DIAGNOSIS — N840 Polyp of corpus uteri: Secondary | ICD-10-CM | POA: Insufficient documentation

## 2018-07-06 DIAGNOSIS — E7800 Pure hypercholesterolemia, unspecified: Secondary | ICD-10-CM | POA: Insufficient documentation

## 2018-07-06 DIAGNOSIS — K219 Gastro-esophageal reflux disease without esophagitis: Secondary | ICD-10-CM | POA: Insufficient documentation

## 2018-07-06 DIAGNOSIS — Z87891 Personal history of nicotine dependence: Secondary | ICD-10-CM | POA: Insufficient documentation

## 2018-07-06 HISTORY — PX: LAPAROSCOPIC HYSTERECTOMY: SHX1926

## 2018-07-06 HISTORY — PX: LAPAROSCOPIC BILATERAL SALPINGO OOPHERECTOMY: SHX5890

## 2018-07-06 LAB — ABO/RH: ABO/RH(D): O POS

## 2018-07-06 SURGERY — HYSTERECTOMY, TOTAL, LAPAROSCOPIC
Anesthesia: General

## 2018-07-06 MED ORDER — LACTATED RINGERS IV SOLN
INTRAVENOUS | Status: DC
  Administered 2018-07-06: 07:00:00 via INTRAVENOUS

## 2018-07-06 MED ORDER — OXYCODONE HCL 5 MG PO TABS
5.0000 mg | ORAL_TABLET | ORAL | Status: DC | PRN
  Administered 2018-07-06: 5 mg via ORAL

## 2018-07-06 MED ORDER — ONDANSETRON HCL 4 MG/2ML IJ SOLN
INTRAMUSCULAR | Status: AC
  Filled 2018-07-06: qty 2

## 2018-07-06 MED ORDER — MIDAZOLAM HCL 2 MG/2ML IJ SOLN
INTRAMUSCULAR | Status: DC | PRN
  Administered 2018-07-06: 2 mg via INTRAVENOUS

## 2018-07-06 MED ORDER — HYDROMORPHONE HCL 1 MG/ML IJ SOLN
INTRAMUSCULAR | Status: DC | PRN
  Administered 2018-07-06 (×2): 0.5 mg via INTRAVENOUS

## 2018-07-06 MED ORDER — IBUPROFEN 800 MG PO TABS: 800.0000 mg | ORAL_TABLET | Freq: Four times a day (QID) | ORAL | 0 refills | Status: DC

## 2018-07-06 MED ORDER — GABAPENTIN 300 MG PO CAPS: 600.0000 mg | ORAL_CAPSULE | ORAL | Status: DC

## 2018-07-06 MED ORDER — CEFAZOLIN SODIUM-DEXTROSE 2-4 GM/100ML-% IV SOLN
INTRAVENOUS | Status: AC
  Filled 2018-07-06: qty 100

## 2018-07-06 MED ORDER — GABAPENTIN 300 MG PO CAPS
ORAL_CAPSULE | ORAL | Status: AC
  Filled 2018-07-06: qty 2

## 2018-07-06 MED ORDER — HYDROMORPHONE HCL 1 MG/ML IJ SOLN
0.2500 mg | INTRAMUSCULAR | Status: DC | PRN
  Administered 2018-07-06 (×2): 0.25 mg via INTRAVENOUS

## 2018-07-06 MED ORDER — DEXAMETHASONE SODIUM PHOSPHATE 10 MG/ML IJ SOLN
4.0000 mg | INTRAMUSCULAR | Status: AC
  Administered 2018-07-06: 4 mg via INTRAVENOUS

## 2018-07-06 MED ORDER — CEFAZOLIN SODIUM-DEXTROSE 2-4 GM/100ML-% IV SOLN
2.0000 g | INTRAVENOUS | Status: AC
  Administered 2018-07-06: 2 g via INTRAVENOUS

## 2018-07-06 MED ORDER — CELECOXIB 200 MG PO CAPS
ORAL_CAPSULE | ORAL | Status: AC
  Filled 2018-07-06: qty 2

## 2018-07-06 MED ORDER — ROCURONIUM BROMIDE 50 MG/5ML IV SOLN
INTRAVENOUS | Status: AC
  Filled 2018-07-06: qty 1

## 2018-07-06 MED ORDER — ACETAMINOPHEN 500 MG PO TABS
1000.0000 mg | ORAL_TABLET | ORAL | Status: AC
  Administered 2018-07-06: 1000 mg via ORAL

## 2018-07-06 MED ORDER — ACETAMINOPHEN 500 MG PO TABS
ORAL_TABLET | ORAL | Status: AC
  Filled 2018-07-06: qty 2

## 2018-07-06 MED ORDER — PROMETHAZINE HCL 25 MG/ML IJ SOLN: 6.2500 mg | INTRAMUSCULAR | Status: DC | PRN

## 2018-07-06 MED ORDER — PHENYLEPHRINE HCL 10 MG/ML IJ SOLN
INTRAMUSCULAR | Status: DC | PRN
  Administered 2018-07-06: 100 ug via INTRAVENOUS

## 2018-07-06 MED ORDER — HEPARIN SODIUM (PORCINE) 5000 UNIT/ML IJ SOLN
INTRAMUSCULAR | Status: AC
  Filled 2018-07-06: qty 1

## 2018-07-06 MED ORDER — LIDOCAINE HCL URETHRAL/MUCOSAL 2 % EX GEL
CUTANEOUS | Status: AC
  Filled 2018-07-06: qty 5

## 2018-07-06 MED ORDER — LACTATED RINGERS IV SOLN: INTRAVENOUS | Status: DC

## 2018-07-06 MED ORDER — SCOPOLAMINE 1 MG/3DAYS TD PT72
MEDICATED_PATCH | TRANSDERMAL | Status: AC
  Filled 2018-07-06: qty 1

## 2018-07-06 MED ORDER — HEPARIN SODIUM (PORCINE) 5000 UNIT/ML IJ SOLN
5000.0000 [IU] | INTRAMUSCULAR | Status: AC
  Administered 2018-07-06: 5000 [IU] via SUBCUTANEOUS

## 2018-07-06 MED ORDER — FENTANYL CITRATE (PF) 250 MCG/5ML IJ SOLN
INTRAMUSCULAR | Status: AC
  Filled 2018-07-06: qty 5

## 2018-07-06 MED ORDER — CELECOXIB 200 MG PO CAPS
400.0000 mg | ORAL_CAPSULE | ORAL | Status: AC
  Administered 2018-07-06: 400 mg via ORAL

## 2018-07-06 MED ORDER — SUGAMMADEX SODIUM 200 MG/2ML IV SOLN
INTRAVENOUS | Status: DC | PRN
  Administered 2018-07-06: 200 mg via INTRAVENOUS

## 2018-07-06 MED ORDER — MORPHINE SULFATE (PF) 4 MG/ML IV SOLN: 1.0000 mg | INTRAVENOUS | Status: DC | PRN

## 2018-07-06 MED ORDER — ONDANSETRON HCL 4 MG/2ML IJ SOLN
INTRAMUSCULAR | Status: DC | PRN
  Administered 2018-07-06: 4 mg via INTRAVENOUS

## 2018-07-06 MED ORDER — HYDROMORPHONE HCL 1 MG/ML IJ SOLN
INTRAMUSCULAR | Status: AC
  Filled 2018-07-06: qty 1

## 2018-07-06 MED ORDER — LIDOCAINE HCL (CARDIAC) PF 100 MG/5ML IV SOSY
PREFILLED_SYRINGE | INTRAVENOUS | Status: DC | PRN
  Administered 2018-07-06: 80 mg via INTRAVENOUS

## 2018-07-06 MED ORDER — OXYCODONE HCL 5 MG PO TABS: 5.0000 mg | ORAL_TABLET | ORAL | 0 refills | Status: DC | PRN

## 2018-07-06 MED ORDER — LACTATED RINGERS IV SOLN
INTRAVENOUS | Status: DC | PRN
  Administered 2018-07-06: 08:00:00 via INTRAVENOUS

## 2018-07-06 MED ORDER — PROPOFOL 10 MG/ML IV BOLUS
INTRAVENOUS | Status: AC
  Filled 2018-07-06: qty 20

## 2018-07-06 MED ORDER — OXYCODONE HCL 5 MG PO TABS
ORAL_TABLET | ORAL | Status: AC
  Filled 2018-07-06: qty 1

## 2018-07-06 MED ORDER — SUCCINYLCHOLINE CHLORIDE 20 MG/ML IJ SOLN
INTRAMUSCULAR | Status: AC
  Filled 2018-07-06: qty 1

## 2018-07-06 MED ORDER — ROCURONIUM BROMIDE 100 MG/10ML IV SOLN
INTRAVENOUS | Status: DC | PRN
  Administered 2018-07-06 (×2): 10 mg via INTRAVENOUS

## 2018-07-06 MED ORDER — SCOPOLAMINE 1 MG/3DAYS TD PT72
1.0000 | MEDICATED_PATCH | TRANSDERMAL | Status: DC
  Administered 2018-07-06: 1 via TRANSDERMAL

## 2018-07-06 MED ORDER — MIDAZOLAM HCL 2 MG/2ML IJ SOLN
INTRAMUSCULAR | Status: AC
  Filled 2018-07-06: qty 2

## 2018-07-06 MED ORDER — HYDROMORPHONE HCL 1 MG/ML IJ SOLN
INTRAMUSCULAR | Status: AC
  Administered 2018-07-06: 0.25 mg via INTRAVENOUS
  Filled 2018-07-06: qty 1

## 2018-07-06 MED ORDER — KETOROLAC TROMETHAMINE 30 MG/ML IJ SOLN
INTRAMUSCULAR | Status: DC | PRN
  Administered 2018-07-06: 30 mg via INTRAVENOUS

## 2018-07-06 MED ORDER — FENTANYL CITRATE (PF) 100 MCG/2ML IJ SOLN
INTRAMUSCULAR | Status: DC | PRN
  Administered 2018-07-06 (×2): 50 ug via INTRAVENOUS

## 2018-07-06 MED ORDER — DEXAMETHASONE SODIUM PHOSPHATE 10 MG/ML IJ SOLN
INTRAMUSCULAR | Status: AC
  Filled 2018-07-06: qty 1

## 2018-07-06 MED ORDER — EPHEDRINE SULFATE 50 MG/ML IJ SOLN
INTRAMUSCULAR | Status: AC
  Filled 2018-07-06: qty 1

## 2018-07-06 SURGICAL SUPPLY — 65 items
ADH SKN CLS APL DERMABOND .7 (GAUZE/BANDAGES/DRESSINGS) ×2
BAG SPEC RTRVL LRG 6X4 10 (ENDOMECHANICALS)
BAG URINE DRAINAGE (UROLOGICAL SUPPLIES) ×3 IMPLANT
BLADE SURG SZ11 CARB STEEL (BLADE) ×3 IMPLANT
CANISTER SUCT 1200ML W/VALVE (MISCELLANEOUS) ×3 IMPLANT
CATH FOLEY 2WAY  5CC 16FR (CATHETERS) ×1
CATH FOLEY 2WAY 5CC 16FR (CATHETERS) ×2
CATH URTH 16FR FL 2W BLN LF (CATHETERS) ×2 IMPLANT
CHLORAPREP W/TINT 26ML (MISCELLANEOUS) ×6 IMPLANT
COVER LIGHT HANDLE STERIS (MISCELLANEOUS) ×1 IMPLANT
COVER WAND RF STERILE (DRAPES) ×3 IMPLANT
DEFOGGER SCOPE WARMER CLEARIFY (MISCELLANEOUS) ×3 IMPLANT
DERMABOND ADVANCED (GAUZE/BANDAGES/DRESSINGS) ×1
DERMABOND ADVANCED .7 DNX12 (GAUZE/BANDAGES/DRESSINGS) ×2 IMPLANT
DEVICE SUTURE ENDOST 10MM (ENDOMECHANICALS) IMPLANT
DRAPE LEGGINS SURG 28X43 STRL (DRAPES) ×3 IMPLANT
DRAPE SHEET LG 3/4 BI-LAMINATE (DRAPES) ×3 IMPLANT
DRAPE UNDER BUTTOCK W/FLU (DRAPES) ×3 IMPLANT
DRSG TELFA 4X3 1S NADH ST (GAUZE/BANDAGES/DRESSINGS) IMPLANT
ELECT REM PT RETURN 9FT ADLT (ELECTROSURGICAL) ×3
ELECTRODE REM PT RTRN 9FT ADLT (ELECTROSURGICAL) ×2 IMPLANT
GLOVE PI ORTHOPRO 6.5 (GLOVE) ×1
GLOVE PI ORTHOPRO STRL 6.5 (GLOVE) ×2 IMPLANT
GLOVE SURG SYN 6.5 ES PF (GLOVE) ×27 IMPLANT
GLOVE SURG SYN 6.5 PF PI (GLOVE) ×6 IMPLANT
GOWN STRL REUS W/ TWL LRG LVL3 (GOWN DISPOSABLE) ×6 IMPLANT
GOWN STRL REUS W/ TWL XL LVL3 (GOWN DISPOSABLE) ×2 IMPLANT
GOWN STRL REUS W/TWL LRG LVL3 (GOWN DISPOSABLE) ×6
GOWN STRL REUS W/TWL XL LVL3 (GOWN DISPOSABLE) ×3
GRASPER SUT TROCAR 14GX15 (MISCELLANEOUS) IMPLANT
IRRIGATION STRYKERFLOW (MISCELLANEOUS) ×2 IMPLANT
IRRIGATOR STRYKERFLOW (MISCELLANEOUS) ×3
IV LACTATED RINGERS 1000ML (IV SOLUTION) ×1 IMPLANT
KIT PINK PAD W/HEAD ARE REST (MISCELLANEOUS) ×3
KIT PINK PAD W/HEAD ARM REST (MISCELLANEOUS) ×2 IMPLANT
KIT TURNOVER CYSTO (KITS) ×3 IMPLANT
L-HOOK LAP DISP 36CM (ELECTROSURGICAL) ×3
LABEL OR SOLS (LABEL) ×3 IMPLANT
LHOOK LAP DISP 36CM (ELECTROSURGICAL) IMPLANT
LIGASURE VESSEL 5MM BLUNT TIP (ELECTROSURGICAL) ×1 IMPLANT
MANIPULATOR VCARE LG CRV RETR (MISCELLANEOUS) IMPLANT
MANIPULATOR VCARE SML CRV RETR (MISCELLANEOUS) IMPLANT
MANIPULATOR VCARE STD CRV RETR (MISCELLANEOUS) ×1 IMPLANT
NS IRRIG 500ML POUR BTL (IV SOLUTION) ×3 IMPLANT
PACK LAP CHOLECYSTECTOMY (MISCELLANEOUS) ×3 IMPLANT
PAD OB MATERNITY 4.3X12.25 (PERSONAL CARE ITEMS) ×3 IMPLANT
PAD PREP 24X41 OB/GYN DISP (PERSONAL CARE ITEMS) ×3 IMPLANT
PENCIL ELECTRO HAND CTR (MISCELLANEOUS) ×3 IMPLANT
POUCH SPECIMEN RETRIEVAL 10MM (ENDOMECHANICALS) ×2 IMPLANT
SCRUB CHG 4% DYNA-HEX 4OZ (MISCELLANEOUS) ×3 IMPLANT
SET CYSTO W/LG BORE CLAMP LF (SET/KITS/TRAYS/PACK) IMPLANT
SET YANKAUER POOLE SUCT (MISCELLANEOUS) ×3 IMPLANT
SLEEVE ENDOPATH XCEL 5M (ENDOMECHANICALS) ×6 IMPLANT
STRAP SAFETY 5IN WIDE (MISCELLANEOUS) ×3 IMPLANT
SURGILUBE 2OZ TUBE FLIPTOP (MISCELLANEOUS) ×3 IMPLANT
SUT ENDO VLOC 180-0-8IN (SUTURE) IMPLANT
SUT MNCRL 4-0 (SUTURE) ×2
SUT MNCRL 4-0 27XMFL (SUTURE) ×2
SUT VIC AB 0 CT1 36 (SUTURE) ×7 IMPLANT
SUTURE MNCRL 4-0 27XMF (SUTURE) ×2 IMPLANT
SYR 10ML LL (SYRINGE) ×3 IMPLANT
TROCAR ENDO BLADELESS 11MM (ENDOMECHANICALS) IMPLANT
TROCAR XCEL NON-BLD 5MMX100MML (ENDOMECHANICALS) ×3 IMPLANT
TUBING INSUF HEATED (TUBING) ×3 IMPLANT
TUBING INSUFFLATION (TUBING) ×3 IMPLANT

## 2018-07-06 NOTE — Transfer of Care (Signed)
Immediate Anesthesia Transfer of Care Note  Patient: Nicole Newman  Procedure(s) Performed: HYSTERECTOMY TOTAL LAPAROSCOPIC (N/A ) LAPAROSCOPIC BILATERAL SALPINGO OOPHORECTOMY (Bilateral )  Patient Location: PACU  Anesthesia Type:General  Level of Consciousness: sedated  Airway & Oxygen Therapy: Patient Spontanous Breathing and Patient connected to face mask oxygen  Post-op Assessment: Report given to RN and Post -op Vital signs reviewed and stable  Post vital signs: Reviewed and stable  Last Vitals:  Vitals Value Taken Time  BP 143/69 07/06/2018 10:04 AM  Temp 36.9 C 07/06/2018 10:04 AM  Pulse 67 07/06/2018 10:17 AM  Resp 11 07/06/2018 10:17 AM  SpO2 100 % 07/06/2018 10:17 AM  Vitals shown include unvalidated device data.  Last Pain:  Vitals:   07/06/18 1004  TempSrc:   PainSc: 0-No pain         Complications: No apparent anesthesia complications

## 2018-07-06 NOTE — H&P (Signed)
Preoperative History and Physical  Nicole Newman is a 68 y.o. here for surgical management of postmenopausal bleeding, recurrent endometrial polyp, benign.   No significant preoperative concerns.  Proposed surgery: total laparoscopic bilateral salpingo-oophorectomy  Past Medical History:  Diagnosis Date  . Dysrhythmia    PVC'S 2017  . Family history of adverse reaction to anesthesia    states mother had small MI during anesthesia  . GERD (gastroesophageal reflux disease)   . High cholesterol   . Hypertension    states under control with med., has been on med. x 2.5 yr.  . Lateral meniscal tear left knee 01/2016   left  . Medial meniscus tear left knee 01/2016   left  . PONV (postoperative nausea and vomiting)   . PVC's (premature ventricular contractions)    Past Surgical History:  Procedure Laterality Date  . APPENDECTOMY    . BREAST BIOPSY    . BREAST REDUCTION SURGERY    . CHONDROPLASTY Left 02/27/2016   Procedure: CHONDROPLASTY;  Surgeon: Elsie Saas, MD;  Location: [facility];  Service: Orthopedics;  Laterality: Left;  . DIAGNOSTIC LAPAROSCOPY    . ESOPHAGEAL DILATION    . EXCISION MORTON'S NEUROMA Right    foot  . KNEE ARTHROSCOPY WITH LATERAL MENISECTOMY Left 02/27/2016   Procedure: KNEE ARTHROSCOPY WITH LATERAL and MEDIAL MENISCECTOMY, left;  Surgeon: Elsie Saas, MD;  Location: [facility];  Service: Orthopedics;  Laterality: Left;  . REDUCTION MAMMAPLASTY Bilateral    OB History  No obstetric history on file.  Patient denies any other pertinent gynecologic issues.   No current facility-administered medications on file prior to encounter.    Current Outpatient Medications on File Prior to Encounter  Medication Sig Dispense Refill  . acetaminophen (TYLENOL) 650 MG CR tablet Take 650-1,300 mg by mouth every 8 (eight) hours as needed for pain.    Marland Kitchen docusate sodium (COLACE) 100 MG capsule Take 100 mg by mouth 2 (two) times daily as  needed for moderate constipation.     . famotidine (PEPCID) 20 MG tablet Take 20 mg by mouth at bedtime as needed for heartburn or indigestion.     Marland Kitchen losartan (COZAAR) 50 MG tablet Take 50 mg by mouth daily.     Allergies  Allergen Reactions  . Lisinopril Cough  . Norvasc [Amlodipine] Swelling and Other (See Comments)    SWELLING OF ANKLES    Social History:   reports that she quit smoking about 37 years ago. She has never used smokeless tobacco. She reports current alcohol use. She reports that she does not use drugs.  Family History  Problem Relation Age of Onset  . Anesthesia problems Mother        had small MI during anesthesia    Review of Systems: Noncontributory  PHYSICAL EXAM: Blood pressure 133/81, pulse 64, temperature 98.7 F (37.1 C), temperature source Oral, resp. rate 16, height 5\' 9"  (1.753 m), weight 108.8 kg, SpO2 99 %. General appearance - alert, well appearing, and in no distress Chest - clear to auscultation, no wheezes, rales or rhonchi, symmetric air entry Heart - normal rate and regular rhythm Abdomen - soft, nontender, nondistended, no masses or organomegaly Pelvic - examination not indicated Extremities - peripheral pulses normal, no pedal edema, no clubbing or cyanosis  Labs: Results for orders placed or performed during the hospital encounter of 07/02/18 (from the past 336 hour(s))  Basic metabolic panel   Collection Time: 07/02/18  8:31 AM  Result Value  Ref Range   Sodium 139 135 - 145 mmol/L   Potassium 4.0 3.5 - 5.1 mmol/L   Chloride 106 98 - 111 mmol/L   CO2 27 22 - 32 mmol/L   Glucose, Bld 90 70 - 99 mg/dL   BUN 13 8 - 23 mg/dL   Creatinine, Ser 0.70 0.44 - 1.00 mg/dL   Calcium 9.0 8.9 - 10.3 mg/dL   GFR calc non Af Amer >60 >60 mL/min   GFR calc Af Amer >60 >60 mL/min   Anion gap 6 5 - 15  CBC   Collection Time: 07/02/18  8:31 AM  Result Value Ref Range   WBC 3.0 (L) 4.0 - 10.5 K/uL   RBC 4.53 3.87 - 5.11 MIL/uL   Hemoglobin 13.1  12.0 - 15.0 g/dL   HCT 41.3 36.0 - 46.0 %   MCV 91.2 80.0 - 100.0 fL   MCH 28.9 26.0 - 34.0 pg   MCHC 31.7 30.0 - 36.0 g/dL   RDW 13.6 11.5 - 15.5 %   Platelets 246 150 - 400 K/uL   nRBC 0.0 0.0 - 0.2 %  Type and screen [place]   Collection Time: 07/02/18  8:31 AM  Result Value Ref Range   ABO/RH(D) O POS    Antibody Screen NEG    Sample Expiration 07/16/2018    Extend sample reason      NO TRANSFUSIONS OR PREGNANCY IN THE PAST 3 MONTHS Performed at [facility], [address] [place], [place] [address]     Imaging Studies: Dg Bone Density (dxa)  Result Date: 06/19/2018 EXAM: DUAL X-RAY ABSORPTIOMETRY (DXA) FOR BONE MINERAL DENSITY IMPRESSION: Referring Physician:  Lavone Orn Your patient completed a BMD test using Lunar IDXA DXA system ( analysis version: 16 ) manufactured by [employer]. Technologist:AW PATIENT: Name: Nicole, Newman Patient ID: [id number] Birth Date: 1950/07/19 Height: 69.0 in. Sex: Female Measured: 06/19/2018 Weight: 245.2 lbs. Indications: Caucasian, Estrogen Deficient, Postmenopausal Fractures: None Treatments: ASSESSMENT: The BMD measured at Femur Neck Right is 1.080 g/cm2 with a T-score of 0.3. This patient is considered normal according to [facility]) criteria. The scan quality is good. L- 4 was excluded due to degenerative changes. Site Region Measured Date Measured Age YA BMD Significant CHANGE T-score DualFemur Neck Right 06/19/2018    65.3         0.3     1.080 g/cm2 AP Spine  L1-L3      06/19/2018    65.3         1.8     1.392 g/cm2 DualFemur Total Mean 8/19/2018    65.3         0.9     1.116 g/cm2 World Health Organization [facility]) criteria for post-menopausal, Caucasian Women: Normal       T-score at or above -1 SD Osteopenia   T-score between -1 and -2.5 SD Osteoporosis T-score at or below -2.5 SD RECOMMENDATION: 1. All patients should optimize calcium and vitamin D intake. 2. Consider FDA approved  medical therapies in postmenopausal women and men aged 22 years and older, based on the following: a. A hip or vertebral (clinical or morphometric) fracture b. T- score < or = -2.5 at the femoral neck or spine after appropriate evaluation to exclude secondary causes c. Low bone mass (T-score between -1.0 and -2.5 at the femoral neck or spine) and a 10 year probability of a hip fracture > or = 3% or a 10 year probability  of a major osteoporosis-related fracture > or = 20% based on the [place]-adapted WHO algorithm d. Clinician judgment and/or patient preferences may indicate treatment for people with 10-year fracture probabilities above or below these levels FOLLOW-UP: People with diagnosed cases of osteoporosis or at high risk for fracture should have regular bone mineral density tests. For patients eligible for Medicare, routine testing is allowed once every 2 years. The testing frequency can be increased to one year for patients who have rapidly progressing disease, those who are receiving or discontinuing medical therapy to restore bone mass, or have additional risk factors. I have reviewed this report and agree with the above findings. [facility] Electronically Signed   By: Rolm Baptise M.D.   On: 06/19/2018 10:09    Assessment: Patient Active Problem List   Diagnosis Date Noted  . Hypertension   . GERD (gastroesophageal reflux disease)   . Family history of adverse reaction to anesthesia   . Medial meniscus tear 01/30/2016  . Lateral meniscal tear left knee 01/30/2016  . Palpitations 07/31/2015  . Ventricular premature beats 07/31/2015  . Tachycardia, unspecified 07/31/2015    Plan: Patient will undergo surgical management with [facility] BSO.   The risks of surgery were discussed in detail with the patient including but not limited to: bleeding which may require transfusion or reoperation; infection which may require antibiotics; injury to surrounding organs which may involve bowel, bladder,  ureters ; need for additional procedures including laparoscopy or laparotomy; thromboembolic phenomenon, surgical site problems and other postoperative/anesthesia complications. Likelihood of success in alleviating the patient's condition was discussed. Routine postoperative instructions will be reviewed with the patient and her family in detail after surgery.  The patient concurred with the proposed plan, giving informed written consent for the surgery.  Patient has been NPO since last night she will remain NPO for procedure.  Anesthesia and OR aware.  Preoperative prophylactic antibiotics and SCDs ordered on call to the OR.  To OR when ready.  ----- Larey Days, MD Attending Obstetrician and Gynecologist [facility], Department of [facility]

## 2018-07-06 NOTE — Anesthesia Postprocedure Evaluation (Signed)
Anesthesia Post Note  Patient: Nicole Newman  Procedure(s) Performed: HYSTERECTOMY TOTAL LAPAROSCOPIC (N/A ) LAPAROSCOPIC BILATERAL SALPINGO OOPHORECTOMY (Bilateral )  Patient location during evaluation: PACU Anesthesia Type: General Level of consciousness: awake and alert Pain management: pain level controlled Vital Signs Assessment: post-procedure vital signs reviewed and stable Respiratory status: spontaneous breathing, nonlabored ventilation, respiratory function stable and patient connected to nasal cannula oxygen Cardiovascular status: blood pressure returned to baseline and stable Postop Assessment: no apparent nausea or vomiting Anesthetic complications: no     Last Vitals:  Vitals:   07/06/18 1056 07/06/18 1107  BP: (!) 149/78 (!) 143/70  Pulse: 64 64  Resp: 11 18  Temp:  36.7 C  SpO2: 98% 98%    Last Pain:  Vitals:   07/06/18 1107  TempSrc: Temporal  PainSc: [place]

## 2018-07-06 NOTE — Discharge Instructions (Signed)
Discharge instructions:  Call office if you have any of the following: fever >101 F, chills, shortness of breath, excessive vaginal bleeding, incision drainage or problems, leg pain or redness, or any other concerns.   Activity: Do not lift > 10 lbs for 8 weeks.  No intercourse or tampons for 8 weeks.  No driving for 1-2 weeks.   You may feel some pain in your upper right abdomen/rib and right shoulder.  This is from the gas in the abdomen for surgery. This will subside over time, please be patient!  Take 800mg  Ibuprofen and 1000mg  Tylenol around the clock, every 6 hours for at least the first 3-5 days.  After this you can take as needed.  This will help decrease inflammation and promote healing.  The narcotics you'll take just as needed, as they just trick your brain into thinking its not in pain.    Please don't limit yourself in terms of routine activity.  You will be able to do most things, although they may take longer to do or be a little painful.  You can do it!  Don't be a hero, but don't be a wimp either!         Total Laparoscopic Hysterectomy, Care After This sheet gives you information about how to care for yourself after your procedure. Your health care provider may also give you more specific instructions. If you have problems or questions, contact your health care provider. What can I expect after the procedure? After the procedure, it is common to have:  Pain and bruising around your incisions.  A sore throat, if a breathing tube was used during surgery.  Fatigue.  Poor appetite.  Less interest in sex. If your ovaries were also removed, it is also common to have symptoms of menopause such as hot flashes, night sweats, and lack of sleep (insomnia). Follow these instructions at home: Bathing  Do not take baths, swim, or use a hot tub until your health care provider approves. You may need to only take showers for 2-3 weeks.  Keep your bandage (dressing) dry until  your health care provider says it can be removed. Incision care   Follow instructions from your health care provider about how to take care of your incisions. Make sure you: ? Wash your hands with soap and water before you change your dressing. If soap and water are not available, use hand sanitizer. ? Change your dressing as told by your health care provider. ? Leave stitches (sutures), skin glue, or adhesive strips in place. These skin closures may need to stay in place for 2 weeks or longer. If adhesive strip edges start to loosen and curl up, you may trim the loose edges. Do not remove adhesive strips completely unless your health care provider tells you to do that.  Check your incision area every day for signs of infection. Check for: ? Redness, swelling, or pain. ? Fluid or blood. ? Warmth. ? Pus or a bad smell. Activity  Get plenty of rest and sleep.  Do not lift anything that is heavier than 10 lbs (4.5 kg) for one month after surgery, or as long as told by your health care provider.  Do not drive or use heavy machinery while taking prescription pain medicine.  Do not drive for 24 hours if you were given a medicine to help you relax (sedative).  Return to your normal activities as told by your health care provider. Ask your health care provider what activities are  safe for you. Lifestyle   Do not use any products that contain nicotine or tobacco, such as cigarettes and e-cigarettes. These can delay healing. If you need help quitting, ask your health care provider.  Do not drink alcohol until your health care provider approves. General instructions  Do not douche, use tampons, or have sex for at least 6 weeks, or as told by your health care provider.  Take over-the-counter and prescription medicines only as told by your health care provider.  To monitor yourself for a fever, take your temperature at least once a day during recovery.  If you struggle with physical or  emotional changes after your procedure, speak with your health care provider or a therapist.  To prevent or treat constipation while you are taking prescription pain medicine, your health care provider may recommend that you: ? Drink enough fluid to keep your urine clear or pale yellow. ? Take over-the-counter or prescription medicines. ? Eat foods that are high in fiber, such as fresh fruits and vegetables, whole grains, and beans. ? Limit foods that are high in fat and processed sugars, such as fried and sweet foods.  Keep all follow-up visits as told by your health care provider. This is important. Contact a health care provider if:  You have chills or a fever.  You have redness, swelling, or pain around an incision.  You have fluid or blood coming from an incision.  Your incision feels warm to the touch.  You have pus or a bad smell coming from an incision.  An incision breaks open.  You feel dizzy or light-headed.  You have pain or bleeding when you urinate.  You have diarrhea, nausea, or vomiting that does not go away.  You have abnormal vaginal discharge.  You have a rash.  You have pain that does not get better with medicine. Get help right away if:  You have a fever and your symptoms suddenly get worse.  You have severe abdominal pain.  You have chest pain.  You have shortness of breath.  You faint.  You have pain, swelling, or redness on your leg.  You have heavy vaginal bleeding with blood clots. Summary  After the procedure it is common to have abdominal pain. Your provider will give you medication for this.  Do not take baths, swim, or use a hot tub until your health care provider approves.  Do not lift anything that is heavier than 10 lbs (4.5 kg) for one month after surgery, or as long as told by your health care provider.  Notify your provider if you have any signs or symptoms of infection after the procedure. This information is not intended  to replace advice given to you by your health care provider. Make sure you discuss any questions you have with your health care provider. Document Released: 04/07/2013 Document Revised: 08/28/2016 Document Reviewed: 08/28/2016 Elsevier Interactive Patient Education  2019 [place]   1) The drugs that you were given will stay in your system until tomorrow so for the next 24 hours you should not:  A) Drive an automobile B) Make any legal decisions C) Drink any alcoholic beverage   2) You may resume regular meals tomorrow.  Today it is better to start with liquids and gradually work up to solid foods.  You may eat anything you prefer, but it is better to start with liquids, then soup and crackers, and gradually work up to solid foods.  3) Please notify your doctor immediately if you have any unusual bleeding, trouble breathing, redness and pain at the surgery site, drainage, fever, or pain not relieved by medication. ° ° ° °4) Additional Instructions: ° ° ° ° ° ° ° °Please contact your physician with any problems or Same Day Surgery at [phone], Monday through Friday 6 am to 4 pm, or [place] [facility] number at [phone]. ° °

## 2018-07-06 NOTE — Op Note (Signed)
Total Laparoscopic Hysterectomy Operative Note Procedure Date: 07/06/2018  Patient:  Nicole Newman  68 y.o. female  PRE-OPERATIVE DIAGNOSIS:  postmenopausal bleeding, recurrent endometrial polyp  POST-OPERATIVE DIAGNOSIS:  postmenopausal bleeding, recurrent endometrial polyp  PROCEDURE:  Procedure(s): HYSTERECTOMY TOTAL LAPAROSCOPIC (N/A) LAPAROSCOPIC BILATERAL SALPINGO OOPHORECTOMY (Bilateral)  SURGEON:  Surgeon(s) and Role:    * Keagen Heinlen, Honor Loh, MD - Primary    * Schermerhorn, Gwen Her, MD - Assisting  ANESTHESIA:  General via ET  I/O  Total I/O In: 1000 [I.V.:1000] Out: 325 [Urine:300; Blood:25]  FINDINGS:   Small uterus, normal Bilateral ovaries and right fallopian tube. Left fallopian tube scarred to pelvic side wall and sigmoid colon Normal upper abdomen. Cecum and omentum adherent to pelvic brim/sidewall/peritoneum on RIGHT.  SPECIMEN: Uterus, Cervix, and bilateral fallopian tubes and ovaries  COMPLICATIONS: none apparent  DISPOSITION: vital signs stable to PACU  Indication for Surgery: 68 y.o. with postmenopausal bleeding, benign endometrial biopsy, and hysteroscopic evidence of an endometrial polyp presents for definitive surgery, as she has had recurrent polyps.  Risks of surgery were discussed with the patient including but not limited to: bleeding which may require transfusion or reoperation; infection which may require antibiotics; injury to bowel, bladder, ureters or other surrounding organs; need for additional procedures including laparotomy, blood clot, incisional problems and other postoperative/anesthesia complications. Written informed consent was obtained.      PROCEDURE IN DETAIL:  The patient had 5000u Heparin Sub-q and sequential compression devices applied to her lower extremities while in the preoperative area.  She was then taken to the operating room. IV antibiotics were given. General anesthesia was administered via endotracheal route.  She was  placed in the dorsal lithotomy position, and was prepped and draped in a sterile manner. A surgical time-out was performed.  A Foley catheter was inserted into her bladder and attached to constant drainage and a V-Care uterine manipulator was then advanced into the uterus and a good fit around the cervix was noted. The gloves were changed, and attention was turned to the abdomen where an umbilical incision was made with the scalpel.  A 71mm trochar was inserted in the umbilical incision using a visiport method.Opening pressure was 77mmHg, and the abdomen was insufflated to 55mmHg carbon dioxide gas and adequate pneumoperitoneum was obtained. A survey of the patient's pelvis and abdomen revealed the findings as mentioned above. A left sided 37mm port was inserted in the lower left quadrant under visualization.   The bovie hook and ligasure were used both bluntly, sharply, and with coagulation to safely dissect the bowel off the sidewall to allow for trochar placement on the right lower quadrant.  Then another 53mm port was inserted in the RLQ under visualization.  The bilateral round ligaments were transected.  On the left, the peritoneum was opened and the ureter was noted retroperitoneally to be far from the IP ligament.  The fimbriated end of this fallopian tube was adherent on this side.  The bilateral IP ligaments were thrice cauterized and transected with the Ligasure. The anterior broad ligament was separated and divided, and brought across the uterus to separate the vesicouterine peritoneum and create a bladder flap. The bladder was pushed away from the uterus. The bilateral uterine arteries were skeletonized, ligated and transected. The bilateral uterosacral and cardinal ligaments were ligated and transected. A colpotomy was made around the V-Care cervical cup and the uterus, cervix, and bilateral tubes were removed through the vagina. The vaginal cuff was closed vaginally using 0-Vicryl in a  running  locking stitch. This was tested for integrity using the surgeon's finger. After a change of gloves, the pneumoperitoneum was recreated and surgical site inspected, and found to be hemostatic. Bilateral ureters were visualized vermiuclating. No intraoperative injury to surrounding organs was noted. The abdomen was desufflated and all instruments were then removed.   All skin incisions were closed with 4-0 monocryl and covered with surgical glue. The patient tolerated the procedures well.  All instruments, needles, and sponge counts were correct x 2. The patient was taken to the recovery room in stable condition.   ---- Larey Days, MD Attending Obstetrician and [facility]

## 2018-07-06 NOTE — Anesthesia Preprocedure Evaluation (Addendum)
Anesthesia Evaluation  Patient identified by MRN, date of birth, ID band Patient awake    Reviewed: Allergy & Precautions, H&P , NPO status , Patient's Chart, lab work & pertinent test results  History of Anesthesia Complications (+) PONV and history of anesthetic complications  Airway Mallampati: III  TM Distance: <3 FB     Dental  (+) Poor Dentition   Pulmonary Recent URI  (10 days ago), former smoker,           Cardiovascular hypertension, + dysrhythmias (PVC's / palpitations)      Neuro/Psych negative neurological ROS  negative psych ROS   GI/Hepatic Neg liver ROS, GERD  Controlled,  Endo/Other  negative endocrine ROS  Renal/GU      Musculoskeletal   Abdominal   Peds  Hematology negative hematology ROS (+)   Anesthesia Other Findings Past Medical History: No date: Dysrhythmia     Comment:  PVC'S 2017 No date: Family history of adverse reaction to anesthesia     Comment:  states mother had small MI during anesthesia No date: GERD (gastroesophageal reflux disease) No date: High cholesterol No date: Hypertension     Comment:  states under control with med., has been on med. x 2.5               yr. 01/2016: Lateral meniscal tear left knee     Comment:  left 01/2016: Medial meniscus tear left knee     Comment:  left No date: PONV (postoperative nausea and vomiting) No date: PVC's (premature ventricular contractions)  Past Surgical History: No date: APPENDECTOMY No date: BREAST BIOPSY No date: BREAST REDUCTION SURGERY 02/27/2016: CHONDROPLASTY; Left     Comment:  Procedure: CHONDROPLASTY;  Surgeon: Elsie Saas, MD;                Location: [facility];  Service:               Orthopedics;  Laterality: Left; No date: DIAGNOSTIC LAPAROSCOPY No date: ESOPHAGEAL DILATION No date: [place]; Right     Comment:  foot 02/27/2016: KNEE ARTHROSCOPY WITH LATERAL MENISECTOMY;  Left     Comment:  Procedure: KNEE ARTHROSCOPY WITH LATERAL and MEDIAL               MENISCECTOMY, left;  Surgeon: Elsie Saas, MD;                Location: [facility];  Service:               Orthopedics;  Laterality: Left; No date: REDUCTION MAMMAPLASTY; Bilateral  BMI    Body Mass Index:  35.42 kg/m      Reproductive/Obstetrics negative OB ROS                          Anesthesia Physical Anesthesia Plan  ASA: II  Anesthesia Plan: General ETT   Post-op Pain Management:    Induction:   PONV Risk Score and Plan: Ondansetron, Dexamethasone, Midazolam and Treatment may vary due to age or medical condition  Airway Management Planned:   Additional Equipment:   Intra-op Plan:   Post-operative Plan:   Informed Consent: I have reviewed the patients History and Physical, chart, labs and discussed the procedure including the risks, benefits and alternatives for the proposed anesthesia with the patient or authorized representative who has indicated his/her understanding and acceptance.   Dental Advisory Given  Plan Discussed with: Anesthesiologist, CRNA and  Surgeon  Anesthesia Plan Comments:         Anesthesia Quick Evaluation

## 2018-07-06 NOTE — Progress Notes (Signed)
Ambulated to bathroom without difficulty , voided 100 ml , pain 5/10 after ambulating PO pain med. Adm. See [facility]

## 2018-07-06 NOTE — Anesthesia Procedure Notes (Addendum)
Procedure Name: Intubation Date/Time: 07/06/2018 8:06 AM Performed by: Justus Memory, CRNA Pre-anesthesia Checklist: Patient identified, Patient being monitored, Timeout performed, Emergency Drugs available and Suction available Patient Re-evaluated:Patient Re-evaluated prior to induction Oxygen Delivery Method: Circle system utilized Preoxygenation: Pre-oxygenation with 100% oxygen Induction Type: IV induction Ventilation: Mask ventilation without difficulty Laryngoscope Size: Miller and 2 Grade View: Grade II Tube type: Oral Tube size: 7.0 mm Number of attempts: 1 Airway Equipment and Method: Stylet Placement Confirmation: ETT inserted through vocal cords under direct vision,  positive ETCO2 and breath sounds checked- equal and bilateral Secured at: 21 cm Tube secured with: Tape Dental Injury: Teeth and Oropharynx as per pre-operative assessment  Difficulty Due To: Difficulty was anticipated and Difficult Airway- due to anterior larynx

## 2018-07-06 NOTE — Anesthesia Post-op Follow-up Note (Signed)
Anesthesia QCDR form completed.        

## 2018-07-08 LAB — SURGICAL PATHOLOGY

## 2019-01-29 ENCOUNTER — Other Ambulatory Visit: Payer: Self-pay | Admitting: Geriatric Medicine

## 2019-01-29 DIAGNOSIS — R1013 Epigastric pain: Secondary | ICD-10-CM

## 2019-02-08 ENCOUNTER — Ambulatory Visit
Admission: RE | Admit: 2019-02-08 | Discharge: 2019-02-08 | Disposition: A | Discharge status: Home / Self Care | Payer: BC Managed Care – PPO | Source: Ambulatory Visit | Attending: Geriatric Medicine | Admitting: Geriatric Medicine

## 2019-02-08 DIAGNOSIS — R1013 Epigastric pain: Secondary | ICD-10-CM

## 2019-02-25 ENCOUNTER — Other Ambulatory Visit: Payer: Self-pay | Admitting: Internal Medicine

## 2019-02-25 DIAGNOSIS — Z1231 Encounter for screening mammogram for malignant neoplasm of breast: Secondary | ICD-10-CM

## 2019-02-26 ENCOUNTER — Other Ambulatory Visit: Payer: Self-pay | Admitting: Internal Medicine

## 2019-02-26 DIAGNOSIS — E7800 Pure hypercholesterolemia, unspecified: Secondary | ICD-10-CM

## 2019-03-05 ENCOUNTER — Other Ambulatory Visit: Payer: Self-pay | Admitting: Gastroenterology

## 2019-03-12 ENCOUNTER — Ambulatory Visit
Admission: RE | Admit: 2019-03-12 | Discharge: 2019-03-12 | Disposition: A | Discharge status: Home / Self Care | Payer: BC Managed Care – PPO | Source: Ambulatory Visit | Attending: Internal Medicine | Admitting: Internal Medicine

## 2019-03-12 DIAGNOSIS — E7800 Pure hypercholesterolemia, unspecified: Secondary | ICD-10-CM

## 2019-03-29 ENCOUNTER — Encounter (HOSPITAL_COMMUNITY): Payer: Self-pay | Admitting: *Deleted

## 2019-03-29 ENCOUNTER — Other Ambulatory Visit (HOSPITAL_COMMUNITY)
Admission: RE | Admit: 2019-03-29 | Discharge: 2019-03-29 | Disposition: A | Discharge status: Home / Self Care | Payer: BC Managed Care – PPO | Source: Ambulatory Visit | Attending: Gastroenterology | Admitting: Gastroenterology

## 2019-03-29 DIAGNOSIS — Z01812 Encounter for preprocedural laboratory examination: Secondary | ICD-10-CM | POA: Insufficient documentation

## 2019-03-29 DIAGNOSIS — Z20828 Contact with and (suspected) exposure to other viral communicable diseases: Secondary | ICD-10-CM | POA: Insufficient documentation

## 2019-03-30 LAB — NOVEL CORONAVIRUS, NAA (HOSP ORDER, SEND-OUT TO REF LAB; TAT 18-24 HRS): SARS-CoV-2, NAA: NOT DETECTED

## 2019-03-31 NOTE — Progress Notes (Signed)
Talked with patient to confirm her appointment for tomorrow. Pt stated that she had been able to quarantine and has not had any fevers or flu like symptoms.

## 2019-04-01 ENCOUNTER — Ambulatory Visit (HOSPITAL_COMMUNITY): Payer: BC Managed Care – PPO

## 2019-04-01 ENCOUNTER — Other Ambulatory Visit: Payer: Self-pay

## 2019-04-01 ENCOUNTER — Encounter (HOSPITAL_COMMUNITY)
Admission: RE | Disposition: A | Discharge status: Home / Self Care | Payer: Self-pay | Source: Home / Self Care | Attending: Gastroenterology

## 2019-04-01 ENCOUNTER — Ambulatory Visit (HOSPITAL_COMMUNITY): Payer: BC Managed Care – PPO | Admitting: Certified Registered Nurse Anesthetist

## 2019-04-01 ENCOUNTER — Encounter (HOSPITAL_COMMUNITY): Payer: Self-pay | Admitting: Registered Nurse

## 2019-04-01 ENCOUNTER — Ambulatory Visit (HOSPITAL_COMMUNITY)
Admission: RE | Admit: 2019-04-01 | Discharge: 2019-04-01 | Disposition: A | Discharge status: Home / Self Care | Payer: BC Managed Care – PPO | Attending: Gastroenterology | Admitting: Gastroenterology

## 2019-04-01 DIAGNOSIS — R1310 Dysphagia, unspecified: Secondary | ICD-10-CM | POA: Diagnosis present

## 2019-04-01 DIAGNOSIS — K222 Esophageal obstruction: Secondary | ICD-10-CM | POA: Insufficient documentation

## 2019-04-01 DIAGNOSIS — Z888 Allergy status to other drugs, medicaments and biological substances status: Secondary | ICD-10-CM | POA: Insufficient documentation

## 2019-04-01 DIAGNOSIS — Z87891 Personal history of nicotine dependence: Secondary | ICD-10-CM | POA: Diagnosis not present

## 2019-04-01 DIAGNOSIS — K219 Gastro-esophageal reflux disease without esophagitis: Secondary | ICD-10-CM | POA: Diagnosis not present

## 2019-04-01 DIAGNOSIS — Z79899 Other long term (current) drug therapy: Secondary | ICD-10-CM | POA: Insufficient documentation

## 2019-04-01 DIAGNOSIS — I10 Essential (primary) hypertension: Secondary | ICD-10-CM | POA: Insufficient documentation

## 2019-04-01 HISTORY — PX: SAVORY DILATION: SHX5439

## 2019-04-01 HISTORY — PX: ESOPHAGOGASTRODUODENOSCOPY (EGD) WITH PROPOFOL: SHX5813

## 2019-04-01 SURGERY — ESOPHAGOGASTRODUODENOSCOPY (EGD) WITH PROPOFOL
Anesthesia: Monitor Anesthesia Care

## 2019-04-01 MED ORDER — PROPOFOL 10 MG/ML IV BOLUS
INTRAVENOUS | Status: AC
  Filled 2019-04-01: qty 40

## 2019-04-01 MED ORDER — LACTATED RINGERS IV SOLN
INTRAVENOUS | Status: DC | PRN
  Administered 2019-04-01: 10:00:00 via INTRAVENOUS

## 2019-04-01 MED ORDER — GLYCOPYRROLATE 0.2 MG/ML IJ SOLN
INTRAMUSCULAR | Status: DC | PRN
  Administered 2019-04-01: 0.1 mg via INTRAVENOUS

## 2019-04-01 MED ORDER — LIDOCAINE HCL (CARDIAC) PF 100 MG/5ML IV SOSY
PREFILLED_SYRINGE | INTRAVENOUS | Status: DC | PRN
  Administered 2019-04-01: 75 mg via INTRAVENOUS

## 2019-04-01 MED ORDER — PROPOFOL 500 MG/50ML IV EMUL
INTRAVENOUS | Status: DC | PRN
  Administered 2019-04-01: 250 ug/kg/min via INTRAVENOUS

## 2019-04-01 MED ORDER — PROPOFOL 10 MG/ML IV BOLUS
INTRAVENOUS | Status: AC
  Filled 2019-04-01: qty 20

## 2019-04-01 MED ORDER — SODIUM CHLORIDE 0.9 % IV SOLN: INTRAVENOUS | Status: DC

## 2019-04-01 MED ORDER — ONDANSETRON HCL 4 MG/2ML IJ SOLN
INTRAMUSCULAR | Status: DC | PRN
  Administered 2019-04-01: 4 mg via INTRAVENOUS

## 2019-04-01 SURGICAL SUPPLY — 14 items

## 2019-04-01 NOTE — Anesthesia Postprocedure Evaluation (Signed)
Anesthesia Post Note  Patient: Nicole Newman  Procedure(s) Performed: ESOPHAGOGASTRODUODENOSCOPY (EGD) WITH PROPOFOL (N/A ) SAVORY DILATION (N/A )     Patient location during evaluation: Endoscopy Anesthesia Type: MAC Level of consciousness: awake and alert Pain management: pain level controlled Vital Signs Assessment: post-procedure vital signs reviewed and stable Respiratory status: spontaneous breathing, nonlabored ventilation, respiratory function stable and patient connected to nasal cannula oxygen Cardiovascular status: stable and blood pressure returned to baseline Postop Assessment: no apparent nausea or vomiting Anesthetic complications: no    Last Vitals:  Vitals:   04/01/19 1130 04/01/19 1140  BP: 119/67 134/78  Pulse: (!) 49 (!) 51  Resp: 10 12  Temp:    SpO2: 100% 100%    Last Pain:  Vitals:   04/01/19 1130  TempSrc:   PainSc: 0-No pain                 Yacine Droz COKER

## 2019-04-01 NOTE — Discharge Instructions (Signed)
YOU HAD AN ENDOSCOPIC PROCEDURE TODAY: Refer to the procedure report and other information in the discharge instructions given to you for any specific questions about what was found during the examination. If this information does not answer your questions, please call [facility] office at [phone] to clarify.   YOU SHOULD EXPECT: Some feelings of bloating in the abdomen. Passage of more gas than usual. Walking can help get rid of the air that was put into your GI tract during the procedure and reduce the bloating. If you had a lower endoscopy (such as a colonoscopy or flexible sigmoidoscopy) you may notice spotting of blood in your stool or on the toilet paper. Some abdominal soreness may be present for a day or two, also.  DIET: Your first meal following the procedure should be a light meal and then it is ok to progress to your normal diet. A half-sandwich or bowl of soup is an example of a good first meal. Heavy or fried foods are harder to digest and may make you feel nauseous or bloated. Drink plenty of fluids but you should avoid alcoholic beverages for 24 hours. If you had a esophageal dilation, please see attached instructions for diet.   ACTIVITY: Your care partner should take you home directly after the procedure. You should plan to take it easy, moving slowly for the rest of the day. You can resume normal activity the day after the procedure however YOU SHOULD NOT DRIVE, use power tools, machinery or perform tasks that involve climbing or major physical exertion for 24 hours (because of the sedation medicines used during the test).   SYMPTOMS TO REPORT IMMEDIATELY: A gastroenterologist can be reached at any hour. Please call [phone]  for any of the following symptoms:   Following lower endoscopy (colonoscopy, flexible sigmoidoscopy) Excessive amounts of blood in the stool  Significant tenderness, worsening of abdominal pains  Swelling of the abdomen that is new, acute  Fever of 100  or higher   Following upper endoscopy (EGD, EUS, ERCP, esophageal dilation) Vomiting of blood or coffee ground material  New, significant abdominal pain  New, significant chest pain or pain under the shoulder blades  Painful or persistently difficult swallowing  New shortness of breath  Black, tarry-looking or red, bloody stools  FOLLOW UP:  If any biopsies were taken you will be contacted by phone or by letter within the next 1-3 weeks. Call [phone]  if you have not heard about the biopsies in 3 weeks.  Please also call with any specific questions about appointments or follow up tests.  Continue current meds. Clear liquids for 4-6 hours, if no chest pain or trouble breathing, soft foods tonight.  Regular diet tomorrow. Call for problems.   INCREASE THE FAMOTIDINE TO 40 MG BID AND CALL REPORT IN ABOUT 1 WEEK

## 2019-04-01 NOTE — Anesthesia Procedure Notes (Signed)
Procedure Name: MAC Date/Time: 04/01/2019 10:47 AM Performed by: Lissa Morales, CRNA Pre-anesthesia Checklist: Patient identified, Suction available, Patient being monitored and Emergency Drugs available Patient Re-evaluated:Patient Re-evaluated prior to induction Oxygen Delivery Method: Simple face mask Placement Confirmation: positive ETCO2

## 2019-04-01 NOTE — H&P (Signed)
Subjective:   Patient is a 68 y.o. female presents with dysphagia.  She has had previous dilations due to peptic stricture.  She was dilated here in [place] by my partner Dr. Amedeo Plenty and then moved to [place] and was dilated once or twice in [place].  Has moved back now to [place].  She has been having vague epigastric pain and progressive dysphasia more for solids and for liquids.  We have discussed EGD and Savary dilatation with the patient she has had this done several times before.  She is on Pepcid.  On no antiplatelet agents or blood thinners.. Procedure including risks and benefits discussed in office.  Patient Active Problem List   Diagnosis Date Noted  . Hypertension   . GERD (gastroesophageal reflux disease)   . Family history of adverse reaction to anesthesia   . Medial meniscus tear 01/30/2016  . Lateral meniscal tear left knee 01/30/2016  . Palpitations 07/31/2015  . Ventricular premature beats 07/31/2015  . Tachycardia, unspecified 07/31/2015   Past Medical History:  Diagnosis Date  . Dysrhythmia    PVC'S 2017  . Family history of adverse reaction to anesthesia    states mother had small MI during anesthesia  . GERD (gastroesophageal reflux disease)   . High cholesterol   . Hypertension    states under control with med., has been on med. x 2.5 yr.  . Lateral meniscal tear left knee 01/2016   left  . Medial meniscus tear left knee 01/2016   left  . PONV (postoperative nausea and vomiting)   . PVC's (premature ventricular contractions)     Past Surgical History:  Procedure Laterality Date  . APPENDECTOMY    . BREAST BIOPSY    . BREAST REDUCTION SURGERY    . CHONDROPLASTY Left 02/27/2016   Procedure: CHONDROPLASTY;  Surgeon: Elsie Saas, MD;  Location: [facility];  Service: Orthopedics;  Laterality: Left;  . DIAGNOSTIC LAPAROSCOPY    . ESOPHAGEAL DILATION    . EXCISION MORTON'S NEUROMA Right    foot  . KNEE ARTHROSCOPY WITH LATERAL  MENISECTOMY Left 02/27/2016   Procedure: KNEE ARTHROSCOPY WITH LATERAL and MEDIAL MENISCECTOMY, left;  Surgeon: Elsie Saas, MD;  Location: [facility];  Service: Orthopedics;  Laterality: Left;  . LAPAROSCOPIC BILATERAL SALPINGO OOPHERECTOMY Bilateral 07/06/2018   Procedure: LAPAROSCOPIC BILATERAL SALPINGO OOPHORECTOMY;  Surgeon: Ward, Honor Loh, MD;  Location: [facility];  Service: Gynecology;  Laterality: Bilateral;  . LAPAROSCOPIC HYSTERECTOMY N/A 07/06/2018   Procedure: HYSTERECTOMY TOTAL LAPAROSCOPIC;  Surgeon: Ward, Honor Loh, MD;  Location: [facility];  Service: Gynecology;  Laterality: N/A;  . REDUCTION MAMMAPLASTY Bilateral     Medications Prior to Admission  Medication Sig Dispense Refill Last Dose  . docusate sodium (COLACE) 100 MG capsule Take 100 mg by mouth 2 (two) times daily as needed for moderate constipation.    Past Month at Unknown time  . famotidine (PEPCID) 20 MG tablet Take 20 mg by mouth 2 (two) times daily.    04/01/2019 at Unknown time  . losartan (COZAAR) 50 MG tablet Take 50 mg by mouth daily.   03/31/2019 at Unknown time   Allergies  Allergen Reactions  . Lisinopril Cough  . Norvasc [Amlodipine] Swelling and Other (See Comments)    SWELLING OF ANKLES  . Statins Other (See Comments)    NO STATINS - Muscle aches    Social History   Tobacco Use  . Smoking status: Former Smoker    Quit date: 06/30/1981  Years since quitting: 37.7  . Smokeless tobacco: Never Used  Substance Use Topics  . Alcohol use: Yes    Comment: occasionally    Family History  Problem Relation Age of Onset  . Anesthesia problems Mother        had small MI during anesthesia     Objective:   Patient Vitals for the past 8 hrs:  BP Temp Temp src Pulse Resp SpO2 Height Weight  04/01/19 0937 128/70 98.5 F (36.9 C) Oral (!) 57 17 100 % 5\' 9"  (1.753 m) 104.3 kg   No intake/output data recorded. No intake/output data recorded.   See MD Preop  evaluation      Assessment:   1.  History of peptic stricture with progressive dysphagia  Plan:   We will proceed with EGD and Savary dilatation with fluoroscopic guidance.  Have discussed with patient in the office.

## 2019-04-01 NOTE — Transfer of Care (Signed)
Immediate Anesthesia Transfer of Care Note  Patient: Nicole Newman  Procedure(s) Performed: ESOPHAGOGASTRODUODENOSCOPY (EGD) WITH PROPOFOL (N/A ) SAVORY DILATION (N/A )  Patient Location: PACU  Anesthesia Type:MAC  Level of Consciousness: awake, alert , oriented and patient cooperative  Airway & Oxygen Therapy: Patient Spontanous Breathing and Patient connected to face mask oxygen  Post-op Assessment: Report given to RN, Post -op Vital signs reviewed and stable and Patient moving all extremities X 4  Post vital signs: stable  Last Vitals:  Vitals Value Taken Time  BP    Temp    Pulse    Resp    SpO2      Last Pain:  Vitals:   04/01/19 0937  TempSrc: Oral  PainSc: 0-No pain         Complications: No apparent anesthesia complications

## 2019-04-01 NOTE — Op Note (Signed)
[facility] Patient Name: Nicole Newman Procedure Date: 04/01/2019 MRN: [id number] Attending MD: Nancy Fetter Dr., MD Date of Birth: Jul 05, 1950 CSN: [id number] Age: 68 Admit Type: Outpatient Procedure:                Upper GI endoscopy with Azzie Almas Dilatiation Indications:              Dysphagia history of previous dilatations Providers:                Jeneen Rinks L. Jamaria Amborn Dr., MD, Cleda Daub, RN,                            William Dalton, Technician Referring MD:             Lavone Orn Medicines:                Monitored Anesthesia Care Complications:            No immediate complications. Estimated Blood Loss:     Estimated blood loss: none. Procedure:                Pre-Anesthesia Assessment:                           - Prior to the procedure, a History and Physical                            was performed, and patient medications and                            allergies were reviewed. The patient's tolerance of                            previous anesthesia was also reviewed. The risks                            and benefits of the procedure and the sedation                            options and risks were discussed with the patient.                            All questions were answered, and informed consent                            was obtained. Prior Anticoagulants: The patient has                            taken no previous anticoagulant or antiplatelet                            agents. ASA Grade Assessment: II - A patient with                            mild systemic disease. After reviewing the risks  and benefits, the patient was deemed in                            satisfactory condition to undergo the procedure.                           After obtaining informed consent, the endoscope was                            passed under direct vision. Throughout the                            procedure, the patient's blood  pressure, pulse, and                            oxygen saturations were monitored continuously. The                            GIF-H190 ([id number] Olympus gastroscope was                            introduced through the mouth, and advanced to the                            second part of duodenum. The upper GI endoscopy was                            accomplished without difficulty. The patient                            tolerated the procedure well. Scope In: Scope Out: Findings:      The examined duodenum was normal.      The stomach was normal.      One benign-appearing, intrinsic mild stenosis was found at the       gastroesophageal junction. The stenosis was traversed. A guidewire was       placed under fluoroscopic guidance and the scope was withdrawn. Dilation       was performed with a Savary dilator with no resistance at 14 mm, 15 mm       and 16 mm. Small heme with 16 mm dilator. Impression:               - Normal examined duodenum.                           - Normal stomach.                           - Benign-appearing esophageal stenosis. Dilated.                           - No specimens collected. Moderate Sedation:      MAC by anesthesia Recommendation:           - Patient has a contact number available for  emergencies. The signs and symptoms of potential                            delayed complications were discussed with the                            patient. Return to normal activities tomorrow.                            Written discharge instructions were provided to the                            patient.                           - Clear liquid diet for 4 hours.                           - Continue present medications.                           - Use Pepcid (famotidine) 40 mg PO twice daily Procedure Code(s):        --- Professional ---                           [phone], Esophagogastroduodenoscopy, flexible,                             transoral; with insertion of guide wire followed by                            passage of dilator(s) through esophagus over guide                            wire Diagnosis Code(s):        --- Professional ---                           K22.2, Esophageal obstruction                           R13.10, Dysphagia, unspecified CPT copyright 2019 [employer]. All rights reserved. The codes documented in this report are preliminary and upon coder review may  be revised to meet current compliance requirements. Nancy Fetter Dr., MD 04/01/2019 11:21:04 AM This report has been signed electronically. Number of Addenda: 0

## 2019-04-01 NOTE — Anesthesia Preprocedure Evaluation (Signed)
Anesthesia Evaluation  Patient identified by MRN, date of birth, ID band Patient awake    Reviewed: Allergy & Precautions, NPO status , Patient's Chart, lab work & pertinent test results  Airway Mallampati: II  TM Distance: >3 FB Neck ROM: Full    Dental  (+) Teeth Intact, Dental Advisory Given   Pulmonary former smoker,    breath sounds clear to auscultation       Cardiovascular hypertension,  Rhythm:Regular Rate:Normal     Neuro/Psych    GI/Hepatic   Endo/Other    Renal/GU      Musculoskeletal   Abdominal   Peds  Hematology   Anesthesia Other Findings   Reproductive/Obstetrics                             Anesthesia Physical Anesthesia Plan  ASA: II  Anesthesia Plan: MAC   Post-op Pain Management:    Induction: Intravenous  PONV Risk Score and Plan: Ondansetron and Propofol infusion  Airway Management Planned: Natural Airway and Simple Face Mask  Additional Equipment:   Intra-op Plan:   Post-operative Plan:   Informed Consent: I have reviewed the patients History and Physical, chart, labs and discussed the procedure including the risks, benefits and alternatives for the proposed anesthesia with the patient or authorized representative who has indicated his/her understanding and acceptance.       Plan Discussed with: Anesthesiologist  Anesthesia Plan Comments:         Anesthesia Quick Evaluation

## 2019-04-02 ENCOUNTER — Encounter (HOSPITAL_COMMUNITY): Payer: Self-pay | Admitting: Gastroenterology

## 2019-04-16 ENCOUNTER — Ambulatory Visit
Admission: RE | Admit: 2019-04-16 | Discharge: 2019-04-16 | Disposition: A | Discharge status: Home / Self Care | Payer: BC Managed Care – PPO | Source: Ambulatory Visit | Attending: Internal Medicine | Admitting: Internal Medicine

## 2019-04-16 ENCOUNTER — Other Ambulatory Visit: Payer: Self-pay

## 2019-04-16 DIAGNOSIS — Z1231 Encounter for screening mammogram for malignant neoplasm of breast: Secondary | ICD-10-CM

## 2019-07-08 ENCOUNTER — Ambulatory Visit: Payer: BC Managed Care – PPO | Attending: Internal Medicine

## 2019-07-08 ENCOUNTER — Other Ambulatory Visit: Payer: BC Managed Care – PPO

## 2019-07-08 ENCOUNTER — Other Ambulatory Visit: Payer: Self-pay

## 2019-07-08 DIAGNOSIS — Z20822 Contact with and (suspected) exposure to covid-19: Secondary | ICD-10-CM

## 2019-07-10 LAB — NOVEL CORONAVIRUS, NAA: SARS-CoV-2, NAA: NOT DETECTED

## 2019-08-04 ENCOUNTER — Encounter (HOSPITAL_COMMUNITY): Payer: Self-pay | Admitting: Physical Therapy

## 2019-08-04 ENCOUNTER — Ambulatory Visit (HOSPITAL_COMMUNITY): Payer: BC Managed Care – PPO | Attending: Internal Medicine | Admitting: Physical Therapy

## 2019-08-04 ENCOUNTER — Other Ambulatory Visit: Payer: Self-pay

## 2019-08-04 DIAGNOSIS — M6281 Muscle weakness (generalized): Secondary | ICD-10-CM | POA: Diagnosis present

## 2019-08-04 DIAGNOSIS — R2689 Other abnormalities of gait and mobility: Secondary | ICD-10-CM | POA: Diagnosis present

## 2019-08-04 DIAGNOSIS — M545 Low back pain, unspecified: Secondary | ICD-10-CM

## 2019-08-04 NOTE — Therapy (Addendum)
[place], [place], [address] Phone: [phone]   Fax:  [phone]  Physical Therapy Evaluation  Patient Details  Name: Nicole Newman MRN: [id number] Date of Birth: 18-Jun-1951 Referring Provider (PT): Lavone Orn MD   Encounter Date: 08/04/2019  PT End of Session - 08/04/19 1541    Visit Number  1    Number of Visits  12    Date for PT Re-Evaluation  09/15/19    Authorization Type  [facility] health plan (no visit limit, no auth)    Authorization Time Period  08/04/19-09/15/19    Authorization - Visit Number  1    Authorization - Number of Visits  10    PT Start Time  0819    PT Stop Time  0901    PT Time Calculation (min)  42 min    Activity Tolerance  Patient tolerated treatment well    Behavior During Therapy  [facility] for tasks assessed/performed       Past Medical History:  Diagnosis Date  . Dysrhythmia    PVC'S 2017  . Family history of adverse reaction to anesthesia    states mother had small MI during anesthesia  . GERD (gastroesophageal reflux disease)   . High cholesterol   . Hypertension    states under control with med., has been on med. x 2.5 yr.  . Lateral meniscal tear left knee 01/2016   left  . Medial meniscus tear left knee 01/2016   left  . PONV (postoperative nausea and vomiting)   . PVC's (premature ventricular contractions)     Past Surgical History:  Procedure Laterality Date  . APPENDECTOMY    . BREAST BIOPSY    . BREAST REDUCTION SURGERY    . CHONDROPLASTY Left 02/27/2016   Procedure: CHONDROPLASTY;  Surgeon: Elsie Saas, MD;  Location: [place];  Service: Orthopedics;  Laterality: Left;  . DIAGNOSTIC LAPAROSCOPY    . ESOPHAGEAL DILATION    . ESOPHAGOGASTRODUODENOSCOPY (EGD) WITH PROPOFOL N/A 04/01/2019   Procedure: ESOPHAGOGASTRODUODENOSCOPY (EGD) WITH PROPOFOL;  Surgeon: Laurence Spates, MD;  [facility];  Service: Endoscopy;  Laterality: N/A;   . EXCISION MORTON'S NEUROMA Right    foot  . KNEE ARTHROSCOPY WITH LATERAL MENISECTOMY Left 02/27/2016   Procedure: KNEE ARTHROSCOPY WITH LATERAL and MEDIAL MENISCECTOMY, left;  Surgeon: Elsie Saas, MD;  Location: [place];  Service: Orthopedics;  Laterality: Left;  . LAPAROSCOPIC BILATERAL SALPINGO OOPHERECTOMY Bilateral 07/06/2018   Procedure: LAPAROSCOPIC BILATERAL SALPINGO OOPHORECTOMY;  Surgeon: Ward, Honor Loh, MD;  Location: [facility];  Service: Gynecology;  Laterality: Bilateral;  . LAPAROSCOPIC HYSTERECTOMY N/A 07/06/2018   Procedure: HYSTERECTOMY TOTAL LAPAROSCOPIC;  Surgeon: Ward, Honor Loh, MD;  Location: [facility];  Service: Gynecology;  Laterality: N/A;  . REDUCTION MAMMAPLASTY Bilateral   . SAVORY DILATION N/A 04/01/2019   Procedure: SAVORY DILATION;  Surgeon: Laurence Spates, MD;  [facility];  Service: Endoscopy;  Laterality: N/A;    There were no vitals filed for this visit.  Subjective Assessment - 08/04/19 0830    Subjective  Patient is a 69 y.o. female who presents to physical therapy with c/o low back pain that increased about 2 weeks ago. Patient states she has had history of low back pain that began in the early 2000s and she had previously herniated disks and pain radiating into RLE. She has c/o numbness in her right femoral nerve area. Prior therapy gave her exercises and wanted  her to do aquatic therapy but she can't go all the way to [place]. L femoral nerve symptoms are about 80% better. She has had pain and numbness/tingling in LLE and calf. She has had worsening of symptoms in her mid/lower back on L side which have increased over the last couple of weeks/days. She currently has to sit for 10 hours/ day for work and its worse as the day progresses. She states pain is worse with household chores and movement. She is not having as much sharp pains but more achy pain now. Her symptoms are there more than not. Pain is achy right now currently  1/10.  She is a very active person. Pain worsens with reaching/ twisting and ache with sitting. Driving, sitting, and sleeping make LLE pain worse. Patient denies changes in bowel/bladder function and no numbness tingling in groin area. Her main goal for therapy is to decrease pain and improve core strength.    Pertinent History  chronic low back pain, sedentary work    Limitations  House hold activities    How long can you stand comfortably?  1-2 hours with house work    Patient Stated Goals  to decrease pain and improve core strength    Currently in Pain?  Yes    Pain Score  1    worst: 6/10   Pain Location  Back         [facility] PT Assessment - 08/04/19 0001      Assessment   Medical Diagnosis  Lumbago with Sciatica     Referring Provider (PT)  Lavone Orn MD    Onset Date/Surgical Date  07/26/19    Next MD Visit  Feb. 25 2021      Precautions   Precautions  None      Restrictions   Weight Bearing Restrictions  No      Balance Screen   Has the patient fallen in the past 6 months  No    Has the patient had a decrease in activity level because of a fear of falling?   Yes    Is the patient reluctant to leave their home because of a fear of falling?   No      Prior Function   Level of Independence  Independent    Vocation  Full time employment      Cognition   Overall Cognitive Status  Within Functional Limits for tasks assessed      Observation/Other Assessments   Observations  Patient ambulates without AD, gait WFL    Focus on Therapeutic Outcomes (FOTO)   35% limited      Sensation   Light Touch  Impaired Detail    Light Touch Impaired Details  --   decreased L S1     ROM / Strength   AROM / PROM / Strength  AROM;Strength      AROM   Overall AROM   Deficits    Overall AROM Comments  Pain L Lumbar spine    AROM Assessment Site  Lumbar    Lumbar Flexion  0% limited   pain   Lumbar Extension  25% limited    Lumbar - Right Side Bend  25% limited    Lumbar - Left  Side Bend  25% limited    Lumbar - Right Rotation  25% limited   pain   Lumbar - Left Rotation  25% limited   pain     Strength   Strength Assessment Site  Hip;Knee;Ankle  Right/Left Hip  Right;Left    Right Hip Flexion  4/5    Right Hip ABduction  4/5    Left Hip Flexion  4-/5    Left Hip ABduction  4+/5    Right/Left Knee  Right;Left    Right Knee Flexion  5/5    Right Knee Extension  5/5    Left Knee Flexion  5/5    Left Knee Extension  5/5    Right/Left Ankle  Right;Left    Right Ankle Dorsiflexion  5/5    Left Ankle Dorsiflexion  5/5      Palpation   Spinal mobility  hypomobile CPA T10-L5, sacrum most tender in upper lumbar spine    Palpation comment  TTP L lumbar paraspinals and L QL      Transfers   Five time sit to stand comments   10.42 seconds      Ambulation/Gait   Ambulation/Gait  Yes    Ambulation/Gait Assistance  7: Independent    Ambulation Distance (Feet)  450 Feet    Assistive device  None    Gait Comments  2 MWT; pain in low back beginning at 36 seconds causing minimal antalgic gait                           PT Education - 08/04/19 1540    Education Details  Patient educated on exam findings, POC, scope of PT.    Person(s) Educated  Patient    Methods  Explanation    Comprehension  Verbalized understanding       PT Short Term Goals - 08/04/19 1607      PT SHORT TERM GOAL #1   Title  Patient will be independent with HEP in order to improve functional outcomes.    Time  3    Period  Weeks    Status  New    Target Date  08/25/19      PT SHORT TERM GOAL #2   Title  Patient will report at least 25% improvement in symptoms for improved quality of life.    Time  3    Period  Weeks    Status  New    Target Date  08/25/19        PT Long Term Goals - 08/04/19 1609      PT LONG TERM GOAL #1   Title  Patient will report at least 75% improvement in symptoms for improved quality of life.    Time  6    Period  Weeks     Status  New    Target Date  09/15/19      PT LONG TERM GOAL #2   Title  Patient will improve FOTO score by at least 6 points for improved tolerance to activity.    Time  6    Period  Weeks    Status  New    Target Date  09/15/19      PT LONG TERM GOAL #3   Title  Patient will report centeralized LBP no greater than 1/10 for last week in order to demonstrate ability to manage symptoms.    Time  6    Period  Weeks    Status  New    Target Date  09/15/19      PT LONG TERM GOAL #4   Title  Patient will improve lumbar ROM by at least 25% in all places for improved ability to perform  household chores.    Time  6    Period  Weeks    Status  New    Target Date  09/15/19            Plan - 08/04/19 1556    Clinical Impression Statement  Patient is a 69 y.o. female who presents to physical therapy with c/o low back pain and LLE radicular symptoms. She presents with pain limited deficits in lumbar and LE strength, ROM, endurance, posture, gait, lumbar segmental mobility, and functional mobility with ADL. She is having to modify and restrict ADL as indicated by FOTO score as well as subjective information and objective measures which is affecting overall participation. Patient's spinal mobility is significantly reduced along with decreased hip strength which are most likely contributing to symptoms. Patient will benefit from skilled physical therapy in order to improve function and reduce impairment.    Personal Factors and Comorbidities  Behavior Pattern;Comorbidity 2    Comorbidities  chronic low back pain, sedentary work    Examination-Activity Limitations  Carry;Lift;Reach Overhead;Stand;Locomotion Level    Examination-Participation Restrictions  Cleaning;Community Activity;Driving;Meal Prep;Volunteer    Stability/Clinical Decision Making  Stable/Uncomplicated    Clinical Decision Making  Low    Rehab Potential  Good    PT Frequency  2x / week    PT Duration  6 weeks    PT  Treatment/Interventions  ADLs/Self Care Home Management;Aquatic Therapy;Biofeedback;Cryotherapy;Electrical Stimulation;Iontophoresis 4mg /ml Dexamethasone;Moist Heat;Ultrasound;Traction;DME Instruction;Gait training;Stair training;Functional mobility training;Therapeutic activities;Therapeutic exercise;Balance training;Neuromuscular re-education;Patient/family education;Orthotic Fit/Training;Manual techniques;Manual lymph drainage;Compression bandaging;Passive range of motion;Dry needling;Energy conservation;Taping;Spinal Manipulations;Joint Manipulations    PT Next Visit Plan  intiate HEP, begin core/hip strengthening, possibly begin manual therapy to improve spinal mobility and reduce pain    PT Home Exercise Plan  Initiate next session    Consulted and Agree with Plan of Care  Patient       Patient will benefit from skilled therapeutic intervention in order to improve the following deficits and impairments:  Abnormal gait, Decreased activity tolerance, Decreased balance, Decreased endurance, Decreased mobility, Decreased range of motion, Decreased strength, Difficulty walking, Hypomobility, Increased muscle spasms, Improper body mechanics, Postural dysfunction, Pain  Visit Diagnosis: Low back pain, unspecified back pain laterality, unspecified chronicity, unspecified whether sciatica present  Muscle weakness (generalized)  Other abnormalities of gait and mobility     Problem List Patient Active Problem List   Diagnosis Date Noted  . Hypertension   . GERD (gastroesophageal reflux disease)   . Family history of adverse reaction to anesthesia   . Medial meniscus tear 01/30/2016  . Lateral meniscal tear left knee 01/30/2016  . Palpitations 07/31/2015  . Ventricular premature beats 07/31/2015  . Tachycardia, unspecified 07/31/2015    4:14 PM, 08/04/19 Mearl Latin PT, DPT Physical Therapist at [place], [place], [address] Phone: [phone]   Fax:  [phone]  Name: Nicole Newman MRN: [id number] Date of Birth: Mar 15, 1951

## 2019-08-09 ENCOUNTER — Encounter (HOSPITAL_COMMUNITY): Payer: Self-pay | Admitting: Physical Therapy

## 2019-08-09 ENCOUNTER — Ambulatory Visit (HOSPITAL_COMMUNITY): Payer: BC Managed Care – PPO | Admitting: Physical Therapy

## 2019-08-09 ENCOUNTER — Other Ambulatory Visit: Payer: Self-pay

## 2019-08-09 DIAGNOSIS — M545 Low back pain, unspecified: Secondary | ICD-10-CM

## 2019-08-09 DIAGNOSIS — R2689 Other abnormalities of gait and mobility: Secondary | ICD-10-CM

## 2019-08-09 DIAGNOSIS — M6281 Muscle weakness (generalized): Secondary | ICD-10-CM

## 2019-08-09 NOTE — Therapy (Signed)
[place], [place], [address] Phone: [phone]   Fax:  [phone]  Physical Therapy Treatment  Patient Details  Name: Nicole Newman MRN: [id number] Date of Birth: 1950-11-01 Referring Provider (PT): Lavone Orn MD   Encounter Date: 08/09/2019  PT End of Session - 08/09/19 1030    Visit Number  2    Number of Visits  12    Date for PT Re-Evaluation  09/15/19    Authorization Type  [facility] health plan (no visit limit, no auth)    Authorization Time Period  08/04/19-09/15/19    Authorization - Visit Number  2    Authorization - Number of Visits  10    PT Start Time  [phone]    PT Stop Time  1028    PT Time Calculation (min)  40 min    Activity Tolerance  Patient tolerated treatment well    Behavior During Therapy  [facility] for tasks assessed/performed       Past Medical History:  Diagnosis Date  . Dysrhythmia    PVC'S 2017  . Family history of adverse reaction to anesthesia    states mother had small MI during anesthesia  . GERD (gastroesophageal reflux disease)   . High cholesterol   . Hypertension    states under control with med., has been on med. x 2.5 yr.  . Lateral meniscal tear left knee 01/2016   left  . Medial meniscus tear left knee 01/2016   left  . PONV (postoperative nausea and vomiting)   . PVC's (premature ventricular contractions)     Past Surgical History:  Procedure Laterality Date  . APPENDECTOMY    . BREAST BIOPSY    . BREAST REDUCTION SURGERY    . CHONDROPLASTY Left 02/27/2016   Procedure: CHONDROPLASTY;  Surgeon: Elsie Saas, MD;  Location: [place];  Service: Orthopedics;  Laterality: Left;  . DIAGNOSTIC LAPAROSCOPY    . ESOPHAGEAL DILATION    . ESOPHAGOGASTRODUODENOSCOPY (EGD) WITH PROPOFOL N/A 04/01/2019   Procedure: ESOPHAGOGASTRODUODENOSCOPY (EGD) WITH PROPOFOL;  Surgeon: Laurence Spates, MD;  [facility];  Service: Endoscopy;  Laterality: N/A;  .  EXCISION MORTON'S NEUROMA Right    foot  . KNEE ARTHROSCOPY WITH LATERAL MENISECTOMY Left 02/27/2016   Procedure: KNEE ARTHROSCOPY WITH LATERAL and MEDIAL MENISCECTOMY, left;  Surgeon: Elsie Saas, MD;  Location: [place];  Service: Orthopedics;  Laterality: Left;  . LAPAROSCOPIC BILATERAL SALPINGO OOPHERECTOMY Bilateral 07/06/2018   Procedure: LAPAROSCOPIC BILATERAL SALPINGO OOPHORECTOMY;  Surgeon: Ward, Honor Loh, MD;  Location: [facility];  Service: Gynecology;  Laterality: Bilateral;  . LAPAROSCOPIC HYSTERECTOMY N/A 07/06/2018   Procedure: HYSTERECTOMY TOTAL LAPAROSCOPIC;  Surgeon: Ward, Honor Loh, MD;  Location: [facility];  Service: Gynecology;  Laterality: N/A;  . REDUCTION MAMMAPLASTY Bilateral   . SAVORY DILATION N/A 04/01/2019   Procedure: SAVORY DILATION;  Surgeon: Laurence Spates, MD;  [facility];  Service: Endoscopy;  Laterality: N/A;    There were no vitals filed for this visit.  Subjective Assessment - 08/09/19 0947    Subjective  Patient reports that her back is about 60% better. She still has it a little bit but she doesn't suffer as much.    Pertinent History  chronic low back pain, sedentary work    Limitations  House hold activities    How long can you stand comfortably?  1-2 hours with house work    Patient Stated Goals  to decrease  pain and improve core strength    Currently in Pain?  Yes    Pain Score  1     Pain Location  Back                       [facility] Adult PT Treatment/Exercise - 08/09/19 0001      Exercises   Exercises  Lumbar      Lumbar Exercises: Supine   Ab Set  10 reps    AB Set Limitations  2x10 with marches    Clam  15 reps    Clam Limitations  2 sets    Bridge  10 reps    Bridge Limitations  2x10    Other Supine Lumbar Exercises  lower trunk rotations 10x10 second holds      Manual Therapy   Manual Therapy  Joint mobilization;Soft tissue mobilization    Manual therapy comments  Completed separatlely  from all oither aspects of treatment    Joint Mobilization  CPA grade II-III L1-L5; GradeII-III L UPA L1-L5    Soft tissue mobilization  L QL, paraspinals; self stm             PT Education - 08/09/19 1030    Education Details  Education on HEP, use of lumbar roll when seated, posture, planning for future sessions.    Person(s) Educated  Patient    Methods  Explanation;Handout    Comprehension  Verbalized understanding       PT Short Term Goals - 08/04/19 1607      PT SHORT TERM GOAL #1   Title  Patient will be independent with HEP in order to improve functional outcomes.    Time  3    Period  Weeks    Status  New    Target Date  08/25/19      PT SHORT TERM GOAL #2   Title  Patient will report at least 25% improvement in symptoms for improved quality of life.    Time  3    Period  Weeks    Status  New    Target Date  08/25/19        PT Long Term Goals - 08/04/19 1609      PT LONG TERM GOAL #1   Title  Patient will report at least 75% improvement in symptoms for improved quality of life.    Time  6    Period  Weeks    Status  New    Target Date  09/15/19      PT LONG TERM GOAL #2   Title  Patient will improve FOTO score by at least 6 points for improved tolerance to activity.    Time  6    Period  Weeks    Status  New    Target Date  09/15/19      PT LONG TERM GOAL #3   Title  Patient will report centeralized LBP no greater than 1/10 for last week in order to demonstrate ability to manage symptoms.    Time  6    Period  Weeks    Status  New    Target Date  09/15/19      PT LONG TERM GOAL #4   Title  Patient will improve lumbar ROM by at least 25% in all places for improved ability to perform household chores.    Time  6    Period  Weeks    Status  New  Target Date  09/15/19            Plan - 08/09/19 1031    Clinical Impression Statement  Patient feels improvement in symptoms following manual therapy. She has tenderness throughout L QL  which improves with STM. She requires min verbal cueing for core strengthening exercises for mechanics. She is able to perform a good ab set following tactile cueing and verbal cueing for "stomach to spine". She is able to perform bridge in limited range with cueing for prior glute contraction and is educated on increasing lift as able. She is educated on using lumbar roll while seated for good posture and she feels reduction in symptoms following use. Patient will continue to benefit from skilled physical therapy in order to improve function and reduce impairment.    Personal Factors and Comorbidities  Behavior Pattern;Comorbidity 2    Comorbidities  chronic low back pain, sedentary work    Examination-Activity Limitations  Carry;Lift;Reach Overhead;Stand;Locomotion Level    Examination-Participation Restrictions  Cleaning;Community Activity;Driving;Meal Prep;Volunteer    Stability/Clinical Decision Making  Stable/Uncomplicated    Rehab Potential  Good    PT Frequency  2x / week    PT Duration  6 weeks    PT Treatment/Interventions  ADLs/Self Care Home Management;Aquatic Therapy;Biofeedback;Cryotherapy;Electrical Stimulation;Iontophoresis 4mg /ml Dexamethasone;Moist Heat;Ultrasound;Traction;DME Instruction;Gait training;Stair training;Functional mobility training;Therapeutic activities;Therapeutic exercise;Balance training;Neuromuscular re-education;Patient/family education;Orthotic Fit/Training;Manual techniques;Manual lymph drainage;Compression bandaging;Passive range of motion;Dry needling;Energy conservation;Taping;Spinal Manipulations;Joint Manipulations    PT Next Visit Plan  continue core/hip strengthening, possibly begin manual therapy to improve spinal mobility and reduce pain    PT Home Exercise Plan  08/09/19 clams, bridges, lumbar roll, self STM, trunk rotations, ab sets with marches    Consulted and Agree with Plan of Care  Patient       Patient will benefit from skilled therapeutic  intervention in order to improve the following deficits and impairments:  Abnormal gait, Decreased activity tolerance, Decreased balance, Decreased endurance, Decreased mobility, Decreased range of motion, Decreased strength, Difficulty walking, Hypomobility, Increased muscle spasms, Improper body mechanics, Postural dysfunction, Pain  Visit Diagnosis: Low back pain, unspecified back pain laterality, unspecified chronicity, unspecified whether sciatica present  Muscle weakness (generalized)  Other abnormalities of gait and mobility     Problem List Patient Active Problem List   Diagnosis Date Noted  . Hypertension   . GERD (gastroesophageal reflux disease)   . Family history of adverse reaction to anesthesia   . Medial meniscus tear 01/30/2016  . Lateral meniscal tear left knee 01/30/2016  . Palpitations 07/31/2015  . Ventricular premature beats 07/31/2015  . Tachycardia, unspecified 07/31/2015    10:33 AM, 08/09/19 Mearl Latin PT, DPT Physical Therapist at [place], [place], [address] Phone: [phone]   Fax:  [phone]  Name: Nicole Newman MRN: [id number] Date of Birth: May 17, 1951

## 2019-08-09 NOTE — Patient Instructions (Signed)
Access Code: [email or web address]  URL: [email or web address]  Date: 08/09/2019  Prepared by: Margie Billet   Exercises Supine Bridge - 10 reps - 2 sets - 1x daily - 7x weekly Supine March - 10 reps - 2 sets - 1x daily - 7x weekly Supine Lower Trunk Rotation - 10 reps - 5-10 seconds hold - 1x daily - 7x weekly Clamshell - 15 reps - 2 sets - 1x daily - 7x weekly

## 2019-08-10 ENCOUNTER — Telehealth (HOSPITAL_COMMUNITY): Payer: Self-pay | Admitting: Physical Therapy

## 2019-08-10 NOTE — Telephone Encounter (Signed)
Pt just got her second covid shot and she thinks she needs to sit with one out and come back in on Monday.

## 2019-08-11 ENCOUNTER — Ambulatory Visit (HOSPITAL_COMMUNITY): Payer: BC Managed Care – PPO | Admitting: Physical Therapy

## 2019-08-12 ENCOUNTER — Ambulatory Visit: Payer: BC Managed Care – PPO | Attending: Internal Medicine

## 2019-08-12 ENCOUNTER — Other Ambulatory Visit: Payer: Self-pay

## 2019-08-12 DIAGNOSIS — Z20822 Contact with and (suspected) exposure to covid-19: Secondary | ICD-10-CM

## 2019-08-14 LAB — NOVEL CORONAVIRUS, NAA

## 2019-08-16 ENCOUNTER — Encounter (HOSPITAL_COMMUNITY): Payer: Self-pay | Admitting: Physical Therapy

## 2019-08-16 ENCOUNTER — Ambulatory Visit (HOSPITAL_COMMUNITY): Payer: BC Managed Care – PPO | Admitting: Physical Therapy

## 2019-08-16 ENCOUNTER — Other Ambulatory Visit: Payer: Self-pay

## 2019-08-16 DIAGNOSIS — M6281 Muscle weakness (generalized): Secondary | ICD-10-CM

## 2019-08-16 DIAGNOSIS — R2689 Other abnormalities of gait and mobility: Secondary | ICD-10-CM

## 2019-08-16 DIAGNOSIS — M545 Low back pain, unspecified: Secondary | ICD-10-CM

## 2019-08-16 NOTE — Patient Instructions (Signed)
Access Code: [email or web address]  URL: [email or web address]  Date: 08/16/2019  Prepared by: Mitzi Hansen Baylen Dea   Exercises Supine Dead Bug with Leg Extension - 10 reps - 3 sets - 1x daily - 7x weekly Quadruped Hip Extension Kicks - 10 reps - 3 sets - 1x daily - 7x weekly Standing Shoulder Row with Anchored Resistance - 15 reps - 3 sets - 1x daily - 7x weekly

## 2019-08-16 NOTE — Therapy (Signed)
[facility], [place], [address] Phone: [phone]   Fax:  [phone]  Physical Therapy Treatment  Patient Details  Name: Nicole Newman MRN: [id number] Date of Birth: 09-Aug-1950 Referring Provider (PT): Lavone Orn MD   Encounter Date: 08/16/2019  PT End of Session - 08/16/19 0948    Visit Number  3    Number of Visits  12    Date for PT Re-Evaluation  09/15/19    Authorization Type  [facility] health plan (no visit limit, no auth)    Authorization Time Period  08/04/19-09/15/19    Authorization - Visit Number  3    Authorization - Number of Visits  10    PT Start Time  0901    PT Stop Time  0942    PT Time Calculation (min)  41 min    Activity Tolerance  Patient tolerated treatment well    Behavior During Therapy  [facility] for tasks assessed/performed       Past Medical History:  Diagnosis Date  . Dysrhythmia    PVC'S 2017  . Family history of adverse reaction to anesthesia    states mother had small MI during anesthesia  . GERD (gastroesophageal reflux disease)   . High cholesterol   . Hypertension    states under control with med., has been on med. x 2.5 yr.  . Lateral meniscal tear left knee 01/2016   left  . Medial meniscus tear left knee 01/2016   left  . PONV (postoperative nausea and vomiting)   . PVC's (premature ventricular contractions)     Past Surgical History:  Procedure Laterality Date  . APPENDECTOMY    . BREAST BIOPSY    . BREAST REDUCTION SURGERY    . CHONDROPLASTY Left 02/27/2016   Procedure: CHONDROPLASTY;  Surgeon: Elsie Saas, MD;  Location: [place];  Service: Orthopedics;  Laterality: Left;  . DIAGNOSTIC LAPAROSCOPY    . ESOPHAGEAL DILATION    . ESOPHAGOGASTRODUODENOSCOPY (EGD) WITH PROPOFOL N/A 04/01/2019   Procedure: ESOPHAGOGASTRODUODENOSCOPY (EGD) WITH PROPOFOL;  Surgeon: Laurence Spates, MD;  Location: [facility];  Service: Endoscopy;  Laterality: N/A;   . EXCISION MORTON'S NEUROMA Right    foot  . KNEE ARTHROSCOPY WITH LATERAL MENISECTOMY Left 02/27/2016   Procedure: KNEE ARTHROSCOPY WITH LATERAL and MEDIAL MENISCECTOMY, left;  Surgeon: Elsie Saas, MD;  Location: [place];  Service: Orthopedics;  Laterality: Left;  . LAPAROSCOPIC BILATERAL SALPINGO OOPHERECTOMY Bilateral 07/06/2018   Procedure: LAPAROSCOPIC BILATERAL SALPINGO OOPHORECTOMY;  Surgeon: Ward, Honor Loh, MD;  Location: [facility];  Service: Gynecology;  Laterality: Bilateral;  . LAPAROSCOPIC HYSTERECTOMY N/A 07/06/2018   Procedure: HYSTERECTOMY TOTAL LAPAROSCOPIC;  Surgeon: Ward, Honor Loh, MD;  Location: [facility];  Service: Gynecology;  Laterality: N/A;  . REDUCTION MAMMAPLASTY Bilateral   . SAVORY DILATION N/A 04/01/2019   Procedure: SAVORY DILATION;  Surgeon: Laurence Spates, MD;  Location: [facility];  Service: Endoscopy;  Laterality: N/A;    There were no vitals filed for this visit.  Subjective Assessment - 08/16/19 0902    Subjective  Patient reports her back as been doing a lot better. She was not having pain at the end of last week but then moved stuff over the weekend which aggravated her back. It is feeling better today. Her exercises have been going well but she has only been able to do them a few times due to power outage.    Pertinent History  chronic  low back pain, sedentary work    Limitations  House hold activities    How long can you stand comfortably?  1-2 hours with house work    Patient Stated Goals  to decrease pain and improve core strength    Currently in Pain?  Yes    Pain Score  1                        [facility] Adult PT Treatment/Exercise - 08/16/19 0001      Lumbar Exercises: Standing   Other Standing Lumbar Exercises  row with green band 3x15      Lumbar Exercises: Supine   Dead Bug  10 reps    Dead Bug Limitations  3 sets; legs only with cueing for ab set      Lumbar Exercises: Quadruped   Other Quadruped Lumbar  Exercises  hip extension in quadruped 3x10      Manual Therapy   Manual Therapy  Joint mobilization;Soft tissue mobilization    Manual therapy comments  Completed separatlely from all oither aspects of treatment    Joint Mobilization  CPA grade II-III L1-L5; GradeII-III L UPA L1-L5    Soft tissue mobilization  L QL, paraspinals; self stm             PT Education - 08/16/19 0947    Education Details  Review of HEP, patient educated on adding lumbar roll and self STM with ball as well as continueing exercises and adding new exercises.    Person(s) Educated  Patient    Methods  Explanation    Comprehension  Verbalized understanding       PT Short Term Goals - 08/04/19 1607      PT SHORT TERM GOAL #1   Title  Patient will be independent with HEP in order to improve functional outcomes.    Time  3    Period  Weeks    Status  New    Target Date  08/25/19      PT SHORT TERM GOAL #2   Title  Patient will report at least 25% improvement in symptoms for improved quality of life.    Time  3    Period  Weeks    Status  New    Target Date  08/25/19        PT Long Term Goals - 08/04/19 1609      PT LONG TERM GOAL #1   Title  Patient will report at least 75% improvement in symptoms for improved quality of life.    Time  6    Period  Weeks    Status  New    Target Date  09/15/19      PT LONG TERM GOAL #2   Title  Patient will improve FOTO score by at least 6 points for improved tolerance to activity.    Time  6    Period  Weeks    Status  New    Target Date  09/15/19      PT LONG TERM GOAL #3   Title  Patient will report centeralized LBP no greater than 1/10 for last week in order to demonstrate ability to manage symptoms.    Time  6    Period  Weeks    Status  New    Target Date  09/15/19      PT LONG TERM GOAL #4   Title  Patient will improve lumbar ROM by at  least 25% in all places for improved ability to perform household chores.    Time  6    Period  Weeks     Status  New    Target Date  09/15/19            Plan - 08/16/19 1039    Clinical Impression Statement  Patient feels reduction in symptoms following manual therapy interventions. She requires min verbal cueing for ab set with core strengthening exercises. She is able to perform quadruped exercise with some verbal cueing for decreasing trunk movement and she shows more movement with fatigue. She tolerates all other exercises well and notes fatigue which decreases after short rest breaks. Patient educated on adding lumbar roll for when she is seated and using ball to perform self STM to decrease muscular tenderness/tension. Patient will continue to benefit from skilled physical therapy in order to reduce impairment in improve function.    Personal Factors and Comorbidities  Behavior Pattern;Comorbidity 2    Comorbidities  chronic low back pain, sedentary work    Examination-Activity Limitations  Carry;Lift;Reach Overhead;Stand;Locomotion Level    Examination-Participation Restrictions  Cleaning;Community Activity;Driving;Meal Prep;Volunteer    Stability/Clinical Decision Making  Stable/Uncomplicated    Rehab Potential  Good    PT Frequency  2x / week    PT Duration  6 weeks    PT Treatment/Interventions  ADLs/Self Care Home Management;Aquatic Therapy;Biofeedback;Cryotherapy;Electrical Stimulation;Iontophoresis 4mg /ml Dexamethasone;Moist Heat;Ultrasound;Traction;DME Instruction;Gait training;Stair training;Functional mobility training;Therapeutic activities;Therapeutic exercise;Balance training;Neuromuscular re-education;Patient/family education;Orthotic Fit/Training;Manual techniques;Manual lymph drainage;Compression bandaging;Passive range of motion;Dry needling;Energy conservation;Taping;Spinal Manipulations;Joint Manipulations    PT Next Visit Plan  continue core/hip strengthening, possibly begin manual therapy to improve spinal mobility and reduce pain    PT Home Exercise Plan  08/09/19  clams, bridges, lumbar roll, self STM, trunk rotations, ab sets with marches 08/16/19 quadruped hip extension, dying bug legs only, standing rows with green band    Consulted and Agree with Plan of Care  Patient       Patient will benefit from skilled therapeutic intervention in order to improve the following deficits and impairments:  Abnormal gait, Decreased activity tolerance, Decreased balance, Decreased endurance, Decreased mobility, Decreased range of motion, Decreased strength, Difficulty walking, Hypomobility, Increased muscle spasms, Improper body mechanics, Postural dysfunction, Pain  Visit Diagnosis: Low back pain, unspecified back pain laterality, unspecified chronicity, unspecified whether sciatica present  Muscle weakness (generalized)  Other abnormalities of gait and mobility     Problem List Patient Active Problem List   Diagnosis Date Noted  . Hypertension   . GERD (gastroesophageal reflux disease)   . Family history of adverse reaction to anesthesia   . Medial meniscus tear 01/30/2016  . Lateral meniscal tear left knee 01/30/2016  . Palpitations 07/31/2015  . Ventricular premature beats 07/31/2015  . Tachycardia, unspecified 07/31/2015    10:45 AM, 08/16/19 Mearl Latin PT, DPT Physical Therapist at [place], [place], [address] Phone: [phone]   Fax:  [phone]  Name: Nicole Newman MRN: [id number] Date of Birth: 05-18-1951

## 2019-08-18 ENCOUNTER — Ambulatory Visit (HOSPITAL_COMMUNITY): Payer: BC Managed Care – PPO | Admitting: Physical Therapy

## 2019-08-23 ENCOUNTER — Ambulatory Visit (HOSPITAL_COMMUNITY): Payer: BC Managed Care – PPO | Admitting: Physical Therapy

## 2019-08-23 ENCOUNTER — Telehealth (HOSPITAL_COMMUNITY): Payer: Self-pay | Admitting: Physical Therapy

## 2019-08-23 NOTE — Telephone Encounter (Signed)
pt called to cancel this appt due to she has a morining meeting at work

## 2019-08-24 ENCOUNTER — Ambulatory Visit (HOSPITAL_COMMUNITY): Payer: BC Managed Care – PPO

## 2019-08-24 ENCOUNTER — Other Ambulatory Visit: Payer: Self-pay

## 2019-08-24 ENCOUNTER — Encounter (HOSPITAL_COMMUNITY): Payer: Self-pay

## 2019-08-24 DIAGNOSIS — M6281 Muscle weakness (generalized): Secondary | ICD-10-CM

## 2019-08-24 DIAGNOSIS — M545 Low back pain, unspecified: Secondary | ICD-10-CM

## 2019-08-24 DIAGNOSIS — R2689 Other abnormalities of gait and mobility: Secondary | ICD-10-CM

## 2019-08-24 NOTE — Therapy (Signed)
[place], [place], [address] Phone: [phone]   Fax:  [phone]  Physical Therapy Treatment  Patient Details  Name: Nicole Newman MRN: [id number] Date of Birth: 01/18/1951 Referring Provider (PT): Lavone Orn MD   Encounter Date: 08/24/2019  PT End of Session - 08/24/19 0911    Visit Number  4    Number of Visits  12    Date for PT Re-Evaluation  09/15/19    Authorization Type  [facility] health plan (no visit limit, no auth)    Authorization Time Period  08/04/19-09/15/19    Authorization - Visit Number  4    Authorization - Number of Visits  10    PT Start Time  0832    PT Stop Time  0910    PT Time Calculation (min)  38 min    Activity Tolerance  Patient tolerated treatment well    Behavior During Therapy  [facility] for tasks assessed/performed       Past Medical History:  Diagnosis Date  . Dysrhythmia    PVC'S 2017  . Family history of adverse reaction to anesthesia    states mother had small MI during anesthesia  . GERD (gastroesophageal reflux disease)   . High cholesterol   . Hypertension    states under control with med., has been on med. x 2.5 yr.  . Lateral meniscal tear left knee 01/2016   left  . Medial meniscus tear left knee 01/2016   left  . PONV (postoperative nausea and vomiting)   . PVC's (premature ventricular contractions)     Past Surgical History:  Procedure Laterality Date  . APPENDECTOMY    . BREAST BIOPSY    . BREAST REDUCTION SURGERY    . CHONDROPLASTY Left 02/27/2016   Procedure: CHONDROPLASTY;  Surgeon: Elsie Saas, MD;  Location: [place];  Service: Orthopedics;  Laterality: Left;  . DIAGNOSTIC LAPAROSCOPY    . ESOPHAGEAL DILATION    . ESOPHAGOGASTRODUODENOSCOPY (EGD) WITH PROPOFOL N/A 04/01/2019   Procedure: ESOPHAGOGASTRODUODENOSCOPY (EGD) WITH PROPOFOL;  Surgeon: Laurence Spates, MD;  [facility];  Service: Endoscopy;  Laterality: N/A;   . EXCISION MORTON'S NEUROMA Right    foot  . KNEE ARTHROSCOPY WITH LATERAL MENISECTOMY Left 02/27/2016   Procedure: KNEE ARTHROSCOPY WITH LATERAL and MEDIAL MENISCECTOMY, left;  Surgeon: Elsie Saas, MD;  Location: [place];  Service: Orthopedics;  Laterality: Left;  . LAPAROSCOPIC BILATERAL SALPINGO OOPHERECTOMY Bilateral 07/06/2018   Procedure: LAPAROSCOPIC BILATERAL SALPINGO OOPHORECTOMY;  Surgeon: Ward, Honor Loh, MD;  Location: [facility];  Service: Gynecology;  Laterality: Bilateral;  . LAPAROSCOPIC HYSTERECTOMY N/A 07/06/2018   Procedure: HYSTERECTOMY TOTAL LAPAROSCOPIC;  Surgeon: Ward, Honor Loh, MD;  Location: [facility];  Service: Gynecology;  Laterality: N/A;  . REDUCTION MAMMAPLASTY Bilateral   . SAVORY DILATION N/A 04/01/2019   Procedure: SAVORY DILATION;  Surgeon: Laurence Spates, MD;  Location: [facility];  Service: Endoscopy;  Laterality: N/A;    There were no vitals filed for this visit.  Subjective Assessment - 08/24/19 0834    Subjective  Pt reports she is feeling good.  No reports of pain currently.  She noticed a little irritation while walking the dog(s) a mile.  Reports significant improvements with relief for sitting long periods of time with the lumbar rolls.                       [facility] Adult PT Treatment/Exercise -  08/24/19 0001      Lumbar Exercises: Standing   Scapular Retraction  Both;10 reps;Theraband    Theraband Level (Scapular Retraction)  Level 3 (Green)    Scapular Retraction Limitations  2 sets    Row  Both;15 reps;Theraband    Theraband Level (Row)  Level 3 (Green)    Row Limitations  2 sets    Shoulder Extension  Both;15 reps;Theraband    Theraband Level (Shoulder Extension)  Level 3 (Green)    Shoulder Extension Limitations  2 sets    Other Standing Lumbar Exercises  front of door cervical retraction 10x 5" hold; BUE flexion during cervical retraction front of door    Other Standing Lumbar Exercises  Paloff 4x 15 in  tandem stance      Lumbar Exercises: Supine   Dead Bug  10 reps    Dead Bug Limitations  3 sets with ab set and arms    Other Supine Lumbar Exercises  90/90 toe taps 3 sets 5 reps               PT Short Term Goals - 08/04/19 1607      PT SHORT TERM GOAL #1   Title  Patient will be independent with HEP in order to improve functional outcomes.    Time  3    Period  Weeks    Status  New    Target Date  08/25/19      PT SHORT TERM GOAL #2   Title  Patient will report at least 25% improvement in symptoms for improved quality of life.    Time  3    Period  Weeks    Status  New    Target Date  08/25/19        PT Long Term Goals - 08/04/19 1609      PT LONG TERM GOAL #1   Title  Patient will report at least 75% improvement in symptoms for improved quality of life.    Time  6    Period  Weeks    Status  New    Target Date  09/15/19      PT LONG TERM GOAL #2   Title  Patient will improve FOTO score by at least 6 points for improved tolerance to activity.    Time  6    Period  Weeks    Status  New    Target Date  09/15/19      PT LONG TERM GOAL #3   Title  Patient will report centeralized LBP no greater than 1/10 for last week in order to demonstrate ability to manage symptoms.    Time  6    Period  Weeks    Status  New    Target Date  09/15/19      PT LONG TERM GOAL #4   Title  Patient will improve lumbar ROM by at least 25% in all places for improved ability to perform household chores.    Time  6    Period  Weeks    Status  New    Target Date  09/15/19            Plan - 08/24/19 0912    Clinical Impression Statement  Pt reports improvements with pain reduction and functional abilities wiht reports of ability to walk 2 dogs this morning a mile without pain.  Progressed core and postural strengthening this session with additional exercises including postural theraband and paloff exercise.  Pt  does continue to require cueing to improve abdominal  contractions and breathing prior movements.  Pt given additional print out to add shoulder extension with theraband to HEP.  Pt reports she feels ready for DC, review goals next sessoin for possible early discharge.    Personal Factors and Comorbidities  Behavior Pattern;Comorbidity 2    Comorbidities  chronic low back pain, sedentary work    Examination-Activity Limitations  Carry;Lift;Reach Overhead;Stand;Locomotion Level    Examination-Participation Restrictions  Cleaning;Community Activity;Driving;Meal Prep;Volunteer    Stability/Clinical Decision Making  Stable/Uncomplicated    Clinical Decision Making  Low    Rehab Potential  Good    PT Frequency  2x / week    PT Duration  6 weeks    PT Treatment/Interventions  ADLs/Self Care Home Management;Aquatic Therapy;Biofeedback;Cryotherapy;Electrical Stimulation;Iontophoresis 4mg /ml Dexamethasone;Moist Heat;Ultrasound;Traction;DME Instruction;Gait training;Stair training;Functional mobility training;Therapeutic activities;Therapeutic exercise;Balance training;Neuromuscular re-education;Patient/family education;Orthotic Fit/Training;Manual techniques;Manual lymph drainage;Compression bandaging;Passive range of motion;Dry needling;Energy conservation;Taping;Spinal Manipulations;Joint Manipulations    PT Next Visit Plan  Review goals for possible early DC per patient.  continue core/hip strengthening, possibly begin manual therapy to improve spinal mobility and reduce pain    PT Home Exercise Plan  08/09/19 clams, bridges, lumbar roll, self STM, trunk rotations, ab sets with marches 08/16/19 quadruped hip extension, dying bug legs only, standing rows with green band       Patient will benefit from skilled therapeutic intervention in order to improve the following deficits and impairments:  Abnormal gait, Decreased activity tolerance, Decreased balance, Decreased endurance, Decreased mobility, Decreased range of motion, Decreased strength, Difficulty walking,  Hypomobility, Increased muscle spasms, Improper body mechanics, Postural dysfunction, Pain  Visit Diagnosis: Low back pain, unspecified back pain laterality, unspecified chronicity, unspecified whether sciatica present  Muscle weakness (generalized)  Other abnormalities of gait and mobility     Problem List Patient Active Problem List   Diagnosis Date Noted  . Hypertension   . GERD (gastroesophageal reflux disease)   . Family history of adverse reaction to anesthesia   . Medial meniscus tear 01/30/2016  . Lateral meniscal tear left knee 01/30/2016  . Palpitations 07/31/2015  . Ventricular premature beats 07/31/2015  . Tachycardia, unspecified 07/31/2015   Ihor Austin, Josephine; Isabela  Aldona Lento 08/24/2019, 9:17 AM  [facility] [address] [place], [place], [address] Phone: [phone]   Fax:  [phone]  Name: THEADA POEPPELMAN MRN: [id number] Date of Birth: [birth year]

## 2019-08-25 ENCOUNTER — Encounter (HOSPITAL_COMMUNITY): Payer: BC Managed Care – PPO | Admitting: Physical Therapy

## 2019-08-30 ENCOUNTER — Ambulatory Visit (HOSPITAL_COMMUNITY): Payer: BC Managed Care – PPO | Admitting: Physical Therapy

## 2019-08-30 ENCOUNTER — Telehealth (HOSPITAL_COMMUNITY): Payer: Self-pay | Admitting: Physical Therapy

## 2019-08-30 NOTE — Telephone Encounter (Signed)
pt called to cancel today's visit and the appt has been rescheduled due to work

## 2019-09-01 ENCOUNTER — Encounter (HOSPITAL_COMMUNITY): Payer: BC Managed Care – PPO | Admitting: Physical Therapy

## 2019-09-02 ENCOUNTER — Ambulatory Visit (HOSPITAL_COMMUNITY): Payer: BC Managed Care – PPO | Attending: Internal Medicine | Admitting: Physical Therapy

## 2019-09-02 ENCOUNTER — Other Ambulatory Visit: Payer: Self-pay

## 2019-09-02 ENCOUNTER — Encounter (HOSPITAL_COMMUNITY): Payer: Self-pay | Admitting: Physical Therapy

## 2019-09-02 DIAGNOSIS — M545 Low back pain, unspecified: Secondary | ICD-10-CM

## 2019-09-02 DIAGNOSIS — M6281 Muscle weakness (generalized): Secondary | ICD-10-CM | POA: Diagnosis present

## 2019-09-02 DIAGNOSIS — R2689 Other abnormalities of gait and mobility: Secondary | ICD-10-CM

## 2019-09-02 NOTE — Therapy (Signed)
[facility] [address] [place], [place], [address] Phone: [phone]   Fax:  [phone]  Physical Therapy Treatment/Discharge Summary  Patient Details  Name: Nicole Newman MRN: [id number] Date of Birth: 05/08/1951 Referring Provider (PT): John Griffin MD   Encounter Date: 09/02/2019 PHYSICAL THERAPY DISCHARGE SUMMARY  Visits from Start of Care: 5  Current functional level related to goals / functional outcomes: See below   Remaining deficits: See below   Education / Equipment:  see below  Plan: Patient agrees to discharge.  Patient goals were met. Patient is being discharged due to meeting the stated rehab goals.  ?????      PT End of Session - 09/02/19 1319    Visit Number  5    Number of Visits  12    Date for PT Re-Evaluation  09/15/19    Authorization Type  [employer] State health plan (no visit limit, no auth)    Authorization Time Period  08/04/19-09/15/19    Authorization - Visit Number  5    Authorization - Number of Visits  10    PT Start Time  1300    PT Stop Time  1333    PT Time Calculation (min)  33 min    Activity Tolerance  Patient tolerated treatment well    Behavior During Therapy  WFL for tasks assessed/performed       Past Medical History:  Diagnosis Date  . Dysrhythmia    PVC'S 2017  . Family history of adverse reaction to anesthesia    states mother had small MI during anesthesia  . GERD (gastroesophageal reflux disease)   . High cholesterol   . Hypertension    states under control with med., has been on med. x 2.5 yr.  . Lateral meniscal tear left knee 01/2016   left  . Medial meniscus tear left knee 01/2016   left  . PONV (postoperative nausea and vomiting)   . PVC's (premature ventricular contractions)     Past Surgical History:  Procedure Laterality Date  . APPENDECTOMY    . BREAST BIOPSY    . BREAST REDUCTION SURGERY    . CHONDROPLASTY Left 02/27/2016   Procedure: CHONDROPLASTY;   Surgeon: Robert Wainer, MD;  Location: [facility];  Service: Orthopedics;  Laterality: Left;  . DIAGNOSTIC LAPAROSCOPY    . ESOPHAGEAL DILATION    . ESOPHAGOGASTRODUODENOSCOPY (EGD) WITH PROPOFOL N/A 04/01/2019   Procedure: ESOPHAGOGASTRODUODENOSCOPY (EGD) WITH PROPOFOL;  Surgeon: Edwards, James, MD;  Location: [facility];  Service: Endoscopy;  Laterality: N/A;  . EXCISION MORTON'S NEUROMA Right    foot  . KNEE ARTHROSCOPY WITH LATERAL MENISECTOMY Left 02/27/2016   Procedure: KNEE ARTHROSCOPY WITH LATERAL and MEDIAL MENISCECTOMY, left;  Surgeon: Robert Wainer, MD;  Location:  SURGERY CENTER;  Service: Orthopedics;  Laterality: Left;  . LAPAROSCOPIC BILATERAL SALPINGO OOPHERECTOMY Bilateral 07/06/2018   Procedure: LAPAROSCOPIC BILATERAL SALPINGO OOPHORECTOMY;  Surgeon: Ward, Chelsea C, MD;  Location: [facility];  Service: Gynecology;  Laterality: Bilateral;  . LAPAROSCOPIC HYSTERECTOMY N/A 07/06/2018   Procedure: HYSTERECTOMY TOTAL LAPAROSCOPIC;  Surgeon: Ward, Chelsea C, MD;  Location: [facility];  Service: Gynecology;  Laterality: N/A;  . REDUCTION MAMMAPLASTY Bilateral   . SAVORY DILATION N/A 04/01/2019   Procedure: SAVORY DILATION;  Surgeon: Edwards, James, MD;  Location: [facility];  Service: Endoscopy;  Laterality: N/A;    There were no vitals filed for this visit.  Subjective Assessment - 09/02/19 1303    Subjective    Patient reports she was feeling better all last week until Thursday or Friday and she begin to have bad pain in her back. She is completing exercises throughout work day. She hasn't had her original pain lately other than the new pain. She feels better overall. Patient feels 80% improvement since beginning physical therapy. She notes intermittent pain with walking her dog. She has intermittent pain at 1/10 which is more of a discomfort which decreases quickly.    Pertinent History  chronic low back pain, sedentary work    Limitations  House hold activities     How long can you stand comfortably?  1-2 hours with house work    Patient Stated Goals  to decrease pain and improve core strength    Currently in Pain?  No/denies         [facility] PT Assessment - 09/02/19 0001      Assessment   Medical Diagnosis  Lumbago with Sciatica     Referring Provider (PT)  Lavone Orn MD    Onset Date/Surgical Date  07/26/19    Next MD Visit  March 25      Precautions   Precautions  None      Restrictions   Weight Bearing Restrictions  No      Balance Screen   Has the patient fallen in the past 6 months  No    Has the patient had a decrease in activity level because of a fear of falling?   Yes    Is the patient reluctant to leave their home because of a fear of falling?   No      Prior Function   Level of Independence  Independent    Vocation  Full time employment      Cognition   Overall Cognitive Status  Within Functional Limits for tasks assessed      Observation/Other Assessments   Observations  Patient ambulates without AD, gait WFL    Focus on Therapeutic Outcomes (FOTO)   17% limited      AROM   Overall AROM   Within functional limits for tasks performed    Lumbar Flexion  0% limited    Lumbar Extension  0% limited    Lumbar - Right Side Bend  0% limited    Lumbar - Left Side Bend  0% limited    Lumbar - Right Rotation  0% limited    Lumbar - Left Rotation  0% limited                           PT Education - 09/02/19 1344    Education Details  Patient educated on returning to physical therapy in the future if needed as well as on HEP, mechanics of exercise, return to exercise, ADL modification, and posture.    Person(s) Educated  Patient    Methods  Explanation    Comprehension  Verbalized understanding       PT Short Term Goals - 09/02/19 1319      PT SHORT TERM GOAL #1   Title  Patient will be independent with HEP in order to improve functional outcomes.    Time  3    Period  Weeks    Status  Achieved     Target Date  08/25/19      PT SHORT TERM GOAL #2   Title  Patient will report at least 25% improvement in symptoms for improved quality of life.  Time  3    Period  Weeks    Status  Achieved    Target Date  08/25/19        PT Long Term Goals - 09/02/19 1337      PT LONG TERM GOAL #1   Title  Patient will report at least 75% improvement in symptoms for improved quality of life.    Time  6    Period  Weeks    Status  Achieved      PT LONG TERM GOAL #2   Title  Patient will improve FOTO score by at least 6 points for improved tolerance to activity.    Time  6    Period  Weeks    Status  Achieved      PT LONG TERM GOAL #3   Title  Patient will report centeralized LBP no greater than 1/10 for last week in order to demonstrate ability to manage symptoms.    Time  6    Period  Weeks    Status  Achieved      PT LONG TERM GOAL #4   Title  Patient will improve lumbar ROM by at least 25% in all places for improved ability to perform household chores.    Time  6    Period  Weeks    Status  Achieved            Plan - 09/02/19 1338    Clinical Impression Statement  Patient has met 2/2 short term goals with ability to complete HEP and improved symptoms. She has met 4/4 long term goals with improved symptoms, increased activity tolerance, decreased pain, and improved lumbar mobility. Patient feels she is able to manage her symptoms with exercises and knowledge she has gained during physical therapy. Patient has c/o intermittent flare up in symptoms which decreases quickly following. Patient educated on returning to physical therapy in the future if needed as well as on HEP, mechanics of exercise, return to exercise, ADL modification, and posture. Patient discharged from physical therapy at this time.    Personal Factors and Comorbidities  Behavior Pattern;Comorbidity 2    Comorbidities  chronic low back pain, sedentary work    Examination-Activity Limitations  Carry;Lift;Reach  Overhead;Stand;Locomotion Level    Examination-Participation Restrictions  Cleaning;Community Activity;Driving;Meal Prep;Volunteer    Rehab Potential  Good    PT Treatment/Interventions  ADLs/Self Care Home Management;Aquatic Therapy;Biofeedback;Cryotherapy;Electrical Stimulation;Iontophoresis 28m/ml Dexamethasone;Moist Heat;Ultrasound;Traction;DME Instruction;Gait training;Stair training;Functional mobility training;Therapeutic activities;Therapeutic exercise;Balance training;Neuromuscular re-education;Patient/family education;Orthotic Fit/Training;Manual techniques;Manual lymph drainage;Compression bandaging;Passive range of motion;Dry needling;Energy conservation;Taping;Spinal Manipulations;Joint Manipulations    PT Next Visit Plan  n/a    PT Home Exercise Plan  08/09/19 clams, bridges, lumbar roll, self STM, trunk rotations, ab sets with marches 08/16/19 quadruped hip extension, dying bug legs only, standing rows with green band    Consulted and Agree with Plan of Care  Patient       Patient will benefit from skilled therapeutic intervention in order to improve the following deficits and impairments:  Abnormal gait, Decreased activity tolerance, Decreased balance, Decreased endurance, Decreased mobility, Decreased range of motion, Decreased strength, Difficulty walking, Hypomobility, Increased muscle spasms, Improper body mechanics, Postural dysfunction, Pain  Visit Diagnosis: Low back pain, unspecified back pain laterality, unspecified chronicity, unspecified whether sciatica present  Muscle weakness (generalized)  Other abnormalities of gait and mobility     Problem List Patient Active Problem List   Diagnosis Date Noted  . Hypertension   . GERD (gastroesophageal reflux disease)   .  Family history of adverse reaction to anesthesia   . Medial meniscus tear 01/30/2016  . Lateral meniscal tear left knee 01/30/2016  . Palpitations 07/31/2015  . Ventricular premature beats 07/31/2015  .  Tachycardia, unspecified 07/31/2015   1:45 PM, 09/02/19 Andrew S. Zaunegger PT, DPT Physical Therapist at [facility]   [facility] [address] [place], [place], [address] Phone: [phone]   Fax:  [phone]  Name: Nicole Newman MRN: [id number] Date of Birth: 11/28/1950   

## 2019-09-06 ENCOUNTER — Ambulatory Visit (HOSPITAL_COMMUNITY): Payer: BC Managed Care – PPO | Admitting: Physical Therapy

## 2019-09-08 ENCOUNTER — Encounter (HOSPITAL_COMMUNITY): Payer: BC Managed Care – PPO | Admitting: Physical Therapy

## 2019-09-13 ENCOUNTER — Encounter (HOSPITAL_COMMUNITY): Payer: BC Managed Care – PPO | Admitting: Physical Therapy

## 2019-09-15 ENCOUNTER — Encounter (HOSPITAL_COMMUNITY): Payer: BC Managed Care – PPO | Admitting: Physical Therapy

## 2020-01-11 DIAGNOSIS — L570 Actinic keratosis: Secondary | ICD-10-CM | POA: Diagnosis not present

## 2020-01-11 DIAGNOSIS — Z85828 Personal history of other malignant neoplasm of skin: Secondary | ICD-10-CM | POA: Diagnosis not present

## 2020-01-11 DIAGNOSIS — D485 Neoplasm of uncertain behavior of skin: Secondary | ICD-10-CM | POA: Diagnosis not present

## 2020-01-11 DIAGNOSIS — L720 Epidermal cyst: Secondary | ICD-10-CM | POA: Diagnosis not present

## 2020-02-25 DIAGNOSIS — G4733 Obstructive sleep apnea (adult) (pediatric): Secondary | ICD-10-CM | POA: Diagnosis not present

## 2020-02-25 DIAGNOSIS — I10 Essential (primary) hypertension: Secondary | ICD-10-CM | POA: Diagnosis not present

## 2020-02-25 DIAGNOSIS — E7800 Pure hypercholesterolemia, unspecified: Secondary | ICD-10-CM | POA: Diagnosis not present

## 2020-02-25 DIAGNOSIS — Z1159 Encounter for screening for other viral diseases: Secondary | ICD-10-CM | POA: Diagnosis not present

## 2020-02-25 DIAGNOSIS — R1013 Epigastric pain: Secondary | ICD-10-CM | POA: Diagnosis not present

## 2020-03-14 DIAGNOSIS — H524 Presbyopia: Secondary | ICD-10-CM | POA: Diagnosis not present

## 2020-03-31 DIAGNOSIS — H57813 Brow ptosis, bilateral: Secondary | ICD-10-CM | POA: Diagnosis not present

## 2020-03-31 DIAGNOSIS — H02834 Dermatochalasis of left upper eyelid: Secondary | ICD-10-CM | POA: Diagnosis not present

## 2020-03-31 DIAGNOSIS — H02831 Dermatochalasis of right upper eyelid: Secondary | ICD-10-CM | POA: Diagnosis not present

## 2020-03-31 DIAGNOSIS — H02413 Mechanical ptosis of bilateral eyelids: Secondary | ICD-10-CM | POA: Diagnosis not present

## 2020-04-03 DIAGNOSIS — H53482 Generalized contraction of visual field, left eye: Secondary | ICD-10-CM | POA: Diagnosis not present

## 2020-04-03 DIAGNOSIS — H53483 Generalized contraction of visual field, bilateral: Secondary | ICD-10-CM | POA: Diagnosis not present

## 2020-04-03 DIAGNOSIS — H53481 Generalized contraction of visual field, right eye: Secondary | ICD-10-CM | POA: Diagnosis not present

## 2020-04-06 DIAGNOSIS — E7800 Pure hypercholesterolemia, unspecified: Secondary | ICD-10-CM | POA: Diagnosis not present

## 2020-04-06 DIAGNOSIS — Z5181 Encounter for therapeutic drug level monitoring: Secondary | ICD-10-CM | POA: Diagnosis not present

## 2020-04-13 DIAGNOSIS — L237 Allergic contact dermatitis due to plants, except food: Secondary | ICD-10-CM | POA: Diagnosis not present

## 2020-04-16 DIAGNOSIS — Z03818 Encounter for observation for suspected exposure to other biological agents ruled out: Secondary | ICD-10-CM | POA: Diagnosis not present

## 2020-04-16 DIAGNOSIS — Z20822 Contact with and (suspected) exposure to covid-19: Secondary | ICD-10-CM | POA: Diagnosis not present

## 2020-06-07 DIAGNOSIS — H02413 Mechanical ptosis of bilateral eyelids: Secondary | ICD-10-CM | POA: Diagnosis not present

## 2020-06-07 DIAGNOSIS — H53483 Generalized contraction of visual field, bilateral: Secondary | ICD-10-CM | POA: Diagnosis not present

## 2020-06-07 DIAGNOSIS — H02834 Dermatochalasis of left upper eyelid: Secondary | ICD-10-CM | POA: Diagnosis not present

## 2020-06-07 DIAGNOSIS — H02831 Dermatochalasis of right upper eyelid: Secondary | ICD-10-CM | POA: Diagnosis not present

## 2020-06-26 DIAGNOSIS — R300 Dysuria: Secondary | ICD-10-CM | POA: Diagnosis not present

## 2020-07-06 ENCOUNTER — Other Ambulatory Visit: Payer: Self-pay

## 2020-07-06 DIAGNOSIS — Z20822 Contact with and (suspected) exposure to covid-19: Secondary | ICD-10-CM | POA: Diagnosis not present

## 2020-07-08 LAB — SARS-COV-2, NAA 2 DAY TAT

## 2020-07-08 LAB — NOVEL CORONAVIRUS, NAA: SARS-CoV-2, NAA: DETECTED — AB

## 2020-07-19 DIAGNOSIS — Z85828 Personal history of other malignant neoplasm of skin: Secondary | ICD-10-CM | POA: Diagnosis not present

## 2020-07-19 DIAGNOSIS — D1721 Benign lipomatous neoplasm of skin and subcutaneous tissue of right arm: Secondary | ICD-10-CM | POA: Diagnosis not present

## 2020-07-19 DIAGNOSIS — D2261 Melanocytic nevi of right upper limb, including shoulder: Secondary | ICD-10-CM | POA: Diagnosis not present

## 2020-07-19 DIAGNOSIS — C44729 Squamous cell carcinoma of skin of left lower limb, including hip: Secondary | ICD-10-CM | POA: Diagnosis not present

## 2020-07-19 DIAGNOSIS — D2262 Melanocytic nevi of left upper limb, including shoulder: Secondary | ICD-10-CM | POA: Diagnosis not present

## 2020-08-04 ENCOUNTER — Other Ambulatory Visit: Payer: Self-pay | Admitting: Internal Medicine

## 2020-08-04 DIAGNOSIS — Z1231 Encounter for screening mammogram for malignant neoplasm of breast: Secondary | ICD-10-CM

## 2020-08-10 DIAGNOSIS — Z03818 Encounter for observation for suspected exposure to other biological agents ruled out: Secondary | ICD-10-CM | POA: Diagnosis not present

## 2020-08-10 DIAGNOSIS — Z20822 Contact with and (suspected) exposure to covid-19: Secondary | ICD-10-CM | POA: Diagnosis not present

## 2020-08-24 DIAGNOSIS — C44729 Squamous cell carcinoma of skin of left lower limb, including hip: Secondary | ICD-10-CM | POA: Diagnosis not present

## 2020-09-25 ENCOUNTER — Other Ambulatory Visit: Payer: Self-pay

## 2020-09-25 ENCOUNTER — Ambulatory Visit
Admission: RE | Admit: 2020-09-25 | Discharge: 2020-09-25 | Disposition: A | Discharge status: Home / Self Care | Payer: Medicare Other | Source: Ambulatory Visit | Attending: Internal Medicine | Admitting: Internal Medicine

## 2020-09-25 DIAGNOSIS — Z1231 Encounter for screening mammogram for malignant neoplasm of breast: Secondary | ICD-10-CM | POA: Diagnosis not present

## 2020-09-27 DIAGNOSIS — H57813 Brow ptosis, bilateral: Secondary | ICD-10-CM | POA: Diagnosis not present

## 2020-09-27 DIAGNOSIS — L905 Scar conditions and fibrosis of skin: Secondary | ICD-10-CM | POA: Diagnosis not present

## 2020-11-10 DIAGNOSIS — W57XXXA Bitten or stung by nonvenomous insect and other nonvenomous arthropods, initial encounter: Secondary | ICD-10-CM | POA: Diagnosis not present

## 2020-11-10 DIAGNOSIS — R5383 Other fatigue: Secondary | ICD-10-CM | POA: Diagnosis not present

## 2020-11-10 DIAGNOSIS — M25531 Pain in right wrist: Secondary | ICD-10-CM | POA: Diagnosis not present

## 2020-11-10 DIAGNOSIS — S30860A Insect bite (nonvenomous) of lower back and pelvis, initial encounter: Secondary | ICD-10-CM | POA: Diagnosis not present

## 2020-11-10 DIAGNOSIS — M25532 Pain in left wrist: Secondary | ICD-10-CM | POA: Diagnosis not present

## 2020-12-01 DIAGNOSIS — D123 Benign neoplasm of transverse colon: Secondary | ICD-10-CM | POA: Diagnosis not present

## 2020-12-01 DIAGNOSIS — K635 Polyp of colon: Secondary | ICD-10-CM | POA: Diagnosis not present

## 2020-12-01 DIAGNOSIS — K644 Residual hemorrhoidal skin tags: Secondary | ICD-10-CM | POA: Diagnosis not present

## 2020-12-01 DIAGNOSIS — Z1211 Encounter for screening for malignant neoplasm of colon: Secondary | ICD-10-CM | POA: Diagnosis not present

## 2020-12-05 DIAGNOSIS — D123 Benign neoplasm of transverse colon: Secondary | ICD-10-CM | POA: Diagnosis not present

## 2020-12-05 DIAGNOSIS — K635 Polyp of colon: Secondary | ICD-10-CM | POA: Diagnosis not present

## 2021-01-14 DIAGNOSIS — S4991XA Unspecified injury of right shoulder and upper arm, initial encounter: Secondary | ICD-10-CM | POA: Diagnosis not present

## 2021-01-14 DIAGNOSIS — M25511 Pain in right shoulder: Secondary | ICD-10-CM | POA: Diagnosis not present

## 2021-01-14 DIAGNOSIS — S80211A Abrasion, right knee, initial encounter: Secondary | ICD-10-CM | POA: Diagnosis not present

## 2021-01-14 DIAGNOSIS — S50311A Abrasion of right elbow, initial encounter: Secondary | ICD-10-CM | POA: Diagnosis not present

## 2021-01-14 DIAGNOSIS — S40211A Abrasion of right shoulder, initial encounter: Secondary | ICD-10-CM | POA: Diagnosis not present

## 2021-01-19 DIAGNOSIS — W19XXXD Unspecified fall, subsequent encounter: Secondary | ICD-10-CM | POA: Diagnosis not present

## 2021-01-19 DIAGNOSIS — M25511 Pain in right shoulder: Secondary | ICD-10-CM | POA: Diagnosis not present

## 2021-01-23 ENCOUNTER — Other Ambulatory Visit: Payer: Self-pay | Admitting: Sports Medicine

## 2021-01-23 DIAGNOSIS — M25511 Pain in right shoulder: Secondary | ICD-10-CM

## 2021-01-27 ENCOUNTER — Ambulatory Visit
Admission: RE | Admit: 2021-01-27 | Discharge: 2021-01-27 | Disposition: A | Discharge status: Home / Self Care | Payer: Medicare Other | Source: Ambulatory Visit | Attending: Sports Medicine | Admitting: Sports Medicine

## 2021-01-27 DIAGNOSIS — M25511 Pain in right shoulder: Secondary | ICD-10-CM

## 2021-02-01 DIAGNOSIS — S43004A Unspecified dislocation of right shoulder joint, initial encounter: Secondary | ICD-10-CM | POA: Diagnosis not present

## 2021-02-06 DIAGNOSIS — R531 Weakness: Secondary | ICD-10-CM | POA: Diagnosis not present

## 2021-02-06 DIAGNOSIS — M25511 Pain in right shoulder: Secondary | ICD-10-CM | POA: Diagnosis not present

## 2021-02-06 DIAGNOSIS — S46811D Strain of other muscles, fascia and tendons at shoulder and upper arm level, right arm, subsequent encounter: Secondary | ICD-10-CM | POA: Diagnosis not present

## 2021-02-06 DIAGNOSIS — M75101 Unspecified rotator cuff tear or rupture of right shoulder, not specified as traumatic: Secondary | ICD-10-CM | POA: Diagnosis not present

## 2021-02-12 DIAGNOSIS — M75101 Unspecified rotator cuff tear or rupture of right shoulder, not specified as traumatic: Secondary | ICD-10-CM | POA: Diagnosis not present

## 2021-02-12 DIAGNOSIS — R231 Pallor: Secondary | ICD-10-CM | POA: Diagnosis not present

## 2021-02-12 DIAGNOSIS — M25511 Pain in right shoulder: Secondary | ICD-10-CM | POA: Diagnosis not present

## 2021-02-12 DIAGNOSIS — S46811D Strain of other muscles, fascia and tendons at shoulder and upper arm level, right arm, subsequent encounter: Secondary | ICD-10-CM | POA: Diagnosis not present

## 2021-02-14 DIAGNOSIS — R531 Weakness: Secondary | ICD-10-CM | POA: Diagnosis not present

## 2021-02-14 DIAGNOSIS — M25511 Pain in right shoulder: Secondary | ICD-10-CM | POA: Diagnosis not present

## 2021-02-14 DIAGNOSIS — M75101 Unspecified rotator cuff tear or rupture of right shoulder, not specified as traumatic: Secondary | ICD-10-CM | POA: Diagnosis not present

## 2021-02-14 DIAGNOSIS — S46811D Strain of other muscles, fascia and tendons at shoulder and upper arm level, right arm, subsequent encounter: Secondary | ICD-10-CM | POA: Diagnosis not present

## 2021-02-19 DIAGNOSIS — M75101 Unspecified rotator cuff tear or rupture of right shoulder, not specified as traumatic: Secondary | ICD-10-CM | POA: Diagnosis not present

## 2021-02-19 DIAGNOSIS — R531 Weakness: Secondary | ICD-10-CM | POA: Diagnosis not present

## 2021-02-19 DIAGNOSIS — M25511 Pain in right shoulder: Secondary | ICD-10-CM | POA: Diagnosis not present

## 2021-02-19 DIAGNOSIS — S46811D Strain of other muscles, fascia and tendons at shoulder and upper arm level, right arm, subsequent encounter: Secondary | ICD-10-CM | POA: Diagnosis not present

## 2021-02-26 DIAGNOSIS — S46811D Strain of other muscles, fascia and tendons at shoulder and upper arm level, right arm, subsequent encounter: Secondary | ICD-10-CM | POA: Diagnosis not present

## 2021-02-26 DIAGNOSIS — Z1389 Encounter for screening for other disorder: Secondary | ICD-10-CM | POA: Diagnosis not present

## 2021-02-26 DIAGNOSIS — K5909 Other constipation: Secondary | ICD-10-CM | POA: Diagnosis not present

## 2021-02-26 DIAGNOSIS — Z0000 Encounter for general adult medical examination without abnormal findings: Secondary | ICD-10-CM | POA: Diagnosis not present

## 2021-02-26 DIAGNOSIS — G4733 Obstructive sleep apnea (adult) (pediatric): Secondary | ICD-10-CM | POA: Diagnosis not present

## 2021-02-26 DIAGNOSIS — M75101 Unspecified rotator cuff tear or rupture of right shoulder, not specified as traumatic: Secondary | ICD-10-CM | POA: Diagnosis not present

## 2021-02-26 DIAGNOSIS — E7800 Pure hypercholesterolemia, unspecified: Secondary | ICD-10-CM | POA: Diagnosis not present

## 2021-02-26 DIAGNOSIS — R531 Weakness: Secondary | ICD-10-CM | POA: Diagnosis not present

## 2021-02-26 DIAGNOSIS — M25511 Pain in right shoulder: Secondary | ICD-10-CM | POA: Diagnosis not present

## 2021-02-26 DIAGNOSIS — I10 Essential (primary) hypertension: Secondary | ICD-10-CM | POA: Diagnosis not present

## 2021-03-07 DIAGNOSIS — S46811D Strain of other muscles, fascia and tendons at shoulder and upper arm level, right arm, subsequent encounter: Secondary | ICD-10-CM | POA: Diagnosis not present

## 2021-03-07 DIAGNOSIS — M25511 Pain in right shoulder: Secondary | ICD-10-CM | POA: Diagnosis not present

## 2021-03-07 DIAGNOSIS — M75101 Unspecified rotator cuff tear or rupture of right shoulder, not specified as traumatic: Secondary | ICD-10-CM | POA: Diagnosis not present

## 2021-03-07 DIAGNOSIS — R531 Weakness: Secondary | ICD-10-CM | POA: Diagnosis not present

## 2021-03-19 DIAGNOSIS — S46011A Strain of muscle(s) and tendon(s) of the rotator cuff of right shoulder, initial encounter: Secondary | ICD-10-CM | POA: Diagnosis not present

## 2021-03-19 DIAGNOSIS — H524 Presbyopia: Secondary | ICD-10-CM | POA: Diagnosis not present

## 2021-03-21 DIAGNOSIS — M25511 Pain in right shoulder: Secondary | ICD-10-CM | POA: Diagnosis not present

## 2021-03-21 DIAGNOSIS — S46811D Strain of other muscles, fascia and tendons at shoulder and upper arm level, right arm, subsequent encounter: Secondary | ICD-10-CM | POA: Diagnosis not present

## 2021-03-21 DIAGNOSIS — M75101 Unspecified rotator cuff tear or rupture of right shoulder, not specified as traumatic: Secondary | ICD-10-CM | POA: Diagnosis not present

## 2021-03-21 DIAGNOSIS — R531 Weakness: Secondary | ICD-10-CM | POA: Diagnosis not present

## 2021-03-29 DIAGNOSIS — R531 Weakness: Secondary | ICD-10-CM | POA: Diagnosis not present

## 2021-03-29 DIAGNOSIS — M75101 Unspecified rotator cuff tear or rupture of right shoulder, not specified as traumatic: Secondary | ICD-10-CM | POA: Diagnosis not present

## 2021-03-29 DIAGNOSIS — M25511 Pain in right shoulder: Secondary | ICD-10-CM | POA: Diagnosis not present

## 2021-03-29 DIAGNOSIS — S46811D Strain of other muscles, fascia and tendons at shoulder and upper arm level, right arm, subsequent encounter: Secondary | ICD-10-CM | POA: Diagnosis not present

## 2021-04-10 DIAGNOSIS — R531 Weakness: Secondary | ICD-10-CM | POA: Diagnosis not present

## 2021-04-10 DIAGNOSIS — S46811D Strain of other muscles, fascia and tendons at shoulder and upper arm level, right arm, subsequent encounter: Secondary | ICD-10-CM | POA: Diagnosis not present

## 2021-04-10 DIAGNOSIS — M25511 Pain in right shoulder: Secondary | ICD-10-CM | POA: Diagnosis not present

## 2021-04-10 DIAGNOSIS — M75101 Unspecified rotator cuff tear or rupture of right shoulder, not specified as traumatic: Secondary | ICD-10-CM | POA: Diagnosis not present

## 2021-04-19 DIAGNOSIS — M25511 Pain in right shoulder: Secondary | ICD-10-CM | POA: Diagnosis not present

## 2021-04-21 DIAGNOSIS — H524 Presbyopia: Secondary | ICD-10-CM | POA: Diagnosis not present

## 2021-05-02 DIAGNOSIS — M25511 Pain in right shoulder: Secondary | ICD-10-CM | POA: Diagnosis not present

## 2021-05-02 DIAGNOSIS — M75101 Unspecified rotator cuff tear or rupture of right shoulder, not specified as traumatic: Secondary | ICD-10-CM | POA: Diagnosis not present

## 2021-05-02 DIAGNOSIS — R531 Weakness: Secondary | ICD-10-CM | POA: Diagnosis not present

## 2021-05-02 DIAGNOSIS — S46811D Strain of other muscles, fascia and tendons at shoulder and upper arm level, right arm, subsequent encounter: Secondary | ICD-10-CM | POA: Diagnosis not present

## 2021-05-22 DIAGNOSIS — M75101 Unspecified rotator cuff tear or rupture of right shoulder, not specified as traumatic: Secondary | ICD-10-CM | POA: Diagnosis not present

## 2021-05-22 DIAGNOSIS — S46811D Strain of other muscles, fascia and tendons at shoulder and upper arm level, right arm, subsequent encounter: Secondary | ICD-10-CM | POA: Diagnosis not present

## 2021-05-22 DIAGNOSIS — R531 Weakness: Secondary | ICD-10-CM | POA: Diagnosis not present

## 2021-05-22 DIAGNOSIS — M25511 Pain in right shoulder: Secondary | ICD-10-CM | POA: Diagnosis not present

## 2021-06-05 DIAGNOSIS — M25511 Pain in right shoulder: Secondary | ICD-10-CM | POA: Diagnosis not present

## 2021-06-05 DIAGNOSIS — S46811D Strain of other muscles, fascia and tendons at shoulder and upper arm level, right arm, subsequent encounter: Secondary | ICD-10-CM | POA: Diagnosis not present

## 2021-06-05 DIAGNOSIS — M75101 Unspecified rotator cuff tear or rupture of right shoulder, not specified as traumatic: Secondary | ICD-10-CM | POA: Diagnosis not present

## 2021-06-05 DIAGNOSIS — R531 Weakness: Secondary | ICD-10-CM | POA: Diagnosis not present

## 2021-06-12 DIAGNOSIS — M25511 Pain in right shoulder: Secondary | ICD-10-CM | POA: Diagnosis not present

## 2021-07-19 DIAGNOSIS — L858 Other specified epidermal thickening: Secondary | ICD-10-CM | POA: Diagnosis not present

## 2021-07-19 DIAGNOSIS — D2262 Melanocytic nevi of left upper limb, including shoulder: Secondary | ICD-10-CM | POA: Diagnosis not present

## 2021-07-19 DIAGNOSIS — D2261 Melanocytic nevi of right upper limb, including shoulder: Secondary | ICD-10-CM | POA: Diagnosis not present

## 2021-07-19 DIAGNOSIS — Z85828 Personal history of other malignant neoplasm of skin: Secondary | ICD-10-CM | POA: Diagnosis not present

## 2021-09-19 DIAGNOSIS — M5412 Radiculopathy, cervical region: Secondary | ICD-10-CM | POA: Diagnosis not present

## 2021-09-19 DIAGNOSIS — S43004A Unspecified dislocation of right shoulder joint, initial encounter: Secondary | ICD-10-CM | POA: Diagnosis not present

## 2021-09-24 ENCOUNTER — Other Ambulatory Visit: Payer: Self-pay | Admitting: Internal Medicine

## 2021-09-24 ENCOUNTER — Other Ambulatory Visit: Payer: Self-pay

## 2021-09-24 ENCOUNTER — Encounter (HOSPITAL_COMMUNITY): Payer: Self-pay | Admitting: Physical Therapy

## 2021-09-24 ENCOUNTER — Ambulatory Visit (HOSPITAL_COMMUNITY): Payer: Medicare Other | Attending: Orthopedic Surgery | Admitting: Physical Therapy

## 2021-09-24 DIAGNOSIS — M25511 Pain in right shoulder: Secondary | ICD-10-CM | POA: Diagnosis not present

## 2021-09-24 DIAGNOSIS — M542 Cervicalgia: Secondary | ICD-10-CM

## 2021-09-24 DIAGNOSIS — G8929 Other chronic pain: Secondary | ICD-10-CM

## 2021-09-24 DIAGNOSIS — Z1231 Encounter for screening mammogram for malignant neoplasm of breast: Secondary | ICD-10-CM

## 2021-09-24 NOTE — Therapy (Signed)
?OUTPATIENT PHYSICAL THERAPY CERVICAL EVALUATION ? ? ?Patient Name: Nicole Newman ?MRN: [id number] ?DOB:1950/08/13, 71 y.o., female ?Today's Date: 09/24/2021 ? ? PT End of Session - 09/24/21 1114   ? ? Visit Number 1   ? Number of Visits 12   ? Date for PT Re-Evaluation 11/05/21   ? Authorization Type [employer]   ? PT Start Time 1400   ? PT Stop Time 2620   ? PT Time Calculation (min) 45 min   ? Activity Tolerance Patient tolerated treatment well   ? ?  ?  ? ?  ? ? ?Past Medical History:  ?Diagnosis Date  ? Dysrhythmia   ? PVC'S 2017  ? Family history of adverse reaction to anesthesia   ? states mother had small MI during anesthesia  ? GERD (gastroesophageal reflux disease)   ? High cholesterol   ? Hypertension   ? states under control with med., has been on med. x 2.5 yr.  ? Lateral meniscal tear left knee 01/2016  ? left  ? Medial meniscus tear left knee 01/2016  ? left  ? PONV (postoperative nausea and vomiting)   ? PVC's (premature ventricular contractions)   ? ?Past Surgical History:  ?Procedure Laterality Date  ? APPENDECTOMY    ? BREAST BIOPSY Right 11/17/2012  ? [place]  ? BREAST REDUCTION SURGERY    ? CHONDROPLASTY Left 02/27/2016  ? Procedure: CHONDROPLASTY;  Surgeon: Elsie Saas, MD;  Location: [facility] [facility];  Service: Orthopedics;  Laterality: Left;  ? DIAGNOSTIC LAPAROSCOPY    ? ESOPHAGEAL DILATION    ? ESOPHAGOGASTRODUODENOSCOPY (EGD) WITH PROPOFOL N/A 04/01/2019  ? Procedure: ESOPHAGOGASTRODUODENOSCOPY (EGD) WITH PROPOFOL;  Surgeon: Laurence Spates, MD;  Location: [facility];  Service: Endoscopy;  Laterality: N/A;  ? 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 KNEE ARTHROSCOPY WITH LATERAL MENISECTOMY Left 02/27/2016  ? Procedure: KNEE ARTHROSCOPY WITH LATERAL and MEDIAL MENISCECTOMY, left;  Surgeon: Elsie Saas, MD;  Location: [facility];  Service: Orthopedics;  Laterality: Left;  ? LAPAROSCOPIC BILATERAL SALPINGO OOPHERECTOMY Bilateral 07/06/2018  ? Procedure: LAPAROSCOPIC  BILATERAL SALPINGO OOPHORECTOMY;  Surgeon: Ward, Honor Loh, MD;  Location: [facility];  Service: Gynecology;  Laterality: Bilateral;  ? LAPAROSCOPIC HYSTERECTOMY N/A 07/06/2018  ? Procedure: HYSTERECTOMY TOTAL LAPAROSCOPIC;  Surgeon: Ward, Honor Loh, MD;  Location: [facility];  Service: Gynecology;  Laterality: N/A;  ? REDUCTION MAMMAPLASTY Bilateral   ? SAVORY DILATION N/A 04/01/2019  ? Procedure: SAVORY DILATION;  Surgeon: Laurence Spates, MD;  Location: [facility];  Service: Endoscopy;  Laterality: N/A;  ? ?Patient Active Problem List  ? Diagnosis Date Noted  ? Hypertension   ? GERD (gastroesophageal reflux disease)   ? Family history of adverse reaction to anesthesia   ? Medial meniscus tear 01/30/2016  ? Lateral meniscal tear left knee 01/30/2016  ? Palpitations 07/31/2015  ? Ventricular premature beats 07/31/2015  ? Tachycardia, unspecified 07/31/2015  ? ? ?PCP: Lavone Orn, MD ? ?REFERRING PROVIDER: Elsie Saas, MD ? ?REFERRING DIAG: PT eval/tx for cervical modalities w/dryneedling and rt shoulder RTC per Elsie Saas, MD  ?THERAPY DIAG:  ?Cervicalgia ? ?Chronic right shoulder pain ? ?ONSET DATE: 09/04/2021 ?SUBJECTIVE:     Pt states that she fell in July and dislocated her shoulder, she was doing fairly well until three weeks ago when she went to paint a ceiling which caused her significant pain.  She now has pain at rest and has increased pain when lifting wt.  Even prior to painting  the ceiling she would notice increased soreness if she lifted 3 # with her hand.  She has been referred to skilled therapy to improve her functional level .  ?                                                                                                                                                                                                   ? ?PERTINENT HISTORY:  ?           OA, RT knee arthroscopic surgery, tachycardia  ?PAIN:  ?Are you having pain? Yes: NPRS scale: 2-3/10 ?Pain location: Rt shoulder blade  ?Pain  description: throbbing at times sharp  ?Aggravating factors: sitting  ?Relieving factors: heat  ? ?PRECAUTIONS: Shoulder do not lift to much wt.  ? ?WEIGHT BEARING RESTRICTIONS No ? ?FALLS:  ?Has patient fallen in last 6 months?no ?Number of falls: 0 ? ?LIVING ENVIRONMENT: ?Lives with: lives with their family and lives with their spouse ?Lives in: House/apartment ?OCCUPATION: retired  ? ?PLOF: Independent ? ?PATIENT GOALS to be fully healed and be able to use her arm again.  ? ?OBJECTIVE:  ? ?DIAGNOSTIC FINDINGS:  ?IMPRESSION: ?1. Potentially torn humeral attachment of the inferior glenohumeral ?ligament with substantial indistinctness of this attachment and ?surrounding ill-defined edema. ?2. Irregular anterior inferior labrum may be torn. ?3. Moderate supraspinatus tendinopathy with some linear high signal ?tracking within the supraspinatus tendon. This definitively reaches ?the articular surface and is probably a partial thickness articular ?surface tear. Pinpoint full-thickness partial width extension to the ?bursal surface is not definitively shown. ?4. Subacromial subdeltoid bursitis. Borderline appearance for ?glenohumeral joint effusion. ?5. Mild supraspinatus muscular strain. ?6. Mild infraspinatus tendinopathy. ? ?PATIENT SURVEYS:  ?FOTO 26 ? ? ?COGNITION: ?Overall cognitive status: Within functional limits for tasks assessed ? ? ?SENSATION: ?WFL ? ?POSTURE:  ?WFL ? ?PALPATION: ?Tender to palpation of rotator cuff mm  ? ?CERVICAL ROM:  ? ?Active ROM A/PROM (deg) ?09/24/2021  ?Flexion WNL; reps increases pain   ?Extension WFL; reps slightly worsens sx   ?Right lateral flexion Wnl ?  ?Left lateral flexion WNL  ?Right rotation WNL  ?Left rotation WNL   ? (Blank rows = not tested) ? ?UE ROM: ? ?Active ROM Right ?09/24/2021 Left ?09/24/2021  ?Shoulder flexion 140 160  ?Shoulder extension    ?Shoulder abduction 80   ?Shoulder adduction    ?Shoulder extension    ?Shoulder internal rotation 60   ?Shoulder external  rotation 40   ? ?UE MMT: ? ?MMT Right ?09/24/2021 Left ?09/24/2021  ?Shoulder flexion 3+/5   ?Shoulder extension    ?  Shoulder abduction 3+/5   ?Shoulder adduction    ?Shoulder extension    ?Shoulder internal rotation 4/5   ?Shoulder external rotation 3+/5   ?CERVICAL SPECIAL TESTS:  ?Cervical strength:  extension:   4/5 ?                              Rt Sidebend: 4-/5 ?                              Lt sidebend: 4-/5  ? ? ? ?TODAY'S TREATMENT:  ?Cervical retraction x 10  ?Wand exercises: ?Flexion x 10 ?Abduction x 10 ?Supine ER/IR x 10  ? ? ?PATIENT EDUCATION:  ?Education details: HEP ?Person educated: Patient ?Education method: Explanation, Tactile cues, and Handouts ?Education comprehension: verbalized understanding and returned demonstration ? ? ?HOME EXERCISE PROGRAM: ?Cervical retraction x 10  ?Wand exercises: ?Flexion x 10 ?Abduction x 10 ?Supine ER/IR x 10 ? ?ASSESSMENT: ? ?CLINICAL IMPRESSION: ?Patient is a 71 y.o. female who was seen today for physical therapy evaluation and treatment for cervicalgia and Rt shoulder pain. Evaluation demonstrates decreased ROM, decreased strength, decreased functional ability, decreased endurance and increased pain. ? ? ?OBJECTIVE IMPAIRMENTS decreased activity tolerance, decreased ROM, decreased strength, increased fascial restrictions, impaired flexibility, impaired UE functional use, and pain.  ? ?ACTIVITY LIMITATIONS cleaning, driving, yard work, and yard work.  ? ?PERSONAL FACTORS Age, Past/current experiences, and 1 comorbidity: Past rotator cuff injury  are also affecting patient's functional outcome.  ? ? ?REHAB POTENTIAL: Good ? ?CLINICAL DECISION MAKING: Stable/uncomplicated ? ?EVALUATION COMPLEXITY: Moderate ? ? ?GOALS: ?Goals reviewed with patient? No ? ?SHORT TERM GOALS: Target date: 10/15/2021 ? ?Pt to have full  right shoulder AROM ?Baseline: see above  ?Goal status: INITIAL ? ?2.  PT pain to be no greater than a 5/10 with full ROM of Rt shoulder ?Baseline: see  above  ?Goal status: INITIAL ? ?3.  Pt to demonstrate good cervical stability while raising both UE into flexion  ?Baseline: pt goes into cervical protraction  ?Goal status: INITIAL ? ? ? ?LONG TERM GOALS: Target da

## 2021-09-26 ENCOUNTER — Other Ambulatory Visit: Payer: Self-pay

## 2021-09-26 ENCOUNTER — Encounter (HOSPITAL_COMMUNITY): Payer: Self-pay

## 2021-09-26 ENCOUNTER — Ambulatory Visit (HOSPITAL_COMMUNITY): Payer: Medicare Other

## 2021-09-26 DIAGNOSIS — G8929 Other chronic pain: Secondary | ICD-10-CM | POA: Diagnosis not present

## 2021-09-26 DIAGNOSIS — M542 Cervicalgia: Secondary | ICD-10-CM | POA: Diagnosis not present

## 2021-09-26 DIAGNOSIS — M25511 Pain in right shoulder: Secondary | ICD-10-CM | POA: Diagnosis not present

## 2021-09-26 NOTE — Therapy (Signed)
?OUTPATIENT PHYSICAL THERAPY CERVICAL EVALUATION ? ? ?Patient Name: Nicole Newman ?MRN: [id number] ?DOB:1950-07-22, 71 y.o., female ?Today's Date: 09/26/2021 ? ? PT End of Session - 09/26/21 0926   ? ? Visit Number 2   ? Number of Visits 12   ? Date for PT Re-Evaluation 11/05/21   ? Authorization Type BCBS   ? PT Start Time [phone]   ? PT Stop Time [phone]   ? PT Time Calculation (min) 40 min   ? Activity Tolerance Patient tolerated treatment well   ? Behavior During Therapy [facility] for tasks assessed/performed   ? ?  ?  ? ?  ? ? ? ?Past Medical History:  ?Diagnosis Date  ? Dysrhythmia   ? PVC'S 2017  ? Family history of adverse reaction to anesthesia   ? states mother had small MI during anesthesia  ? GERD (gastroesophageal reflux disease)   ? High cholesterol   ? Hypertension   ? states under control with med., has been on med. x 2.5 yr.  ? Lateral meniscal tear left knee 01/2016  ? left  ? Medial meniscus tear left knee 01/2016  ? left  ? PONV (postoperative nausea and vomiting)   ? PVC's (premature ventricular contractions)   ? ?Past Surgical History:  ?Procedure Laterality Date  ? APPENDECTOMY    ? BREAST BIOPSY Right 11/17/2012  ? [place]  ? BREAST REDUCTION SURGERY    ? CHONDROPLASTY Left 02/27/2016  ? Procedure: CHONDROPLASTY;  Surgeon: Elsie Saas, MD;  Location: [place];  Service: Orthopedics;  Laterality: Left;  ? DIAGNOSTIC LAPAROSCOPY    ? ESOPHAGEAL DILATION    ? ESOPHAGOGASTRODUODENOSCOPY (EGD) WITH PROPOFOL N/A 04/01/2019  ? Procedure: ESOPHAGOGASTRODUODENOSCOPY (EGD) WITH PROPOFOL;  Surgeon: Laurence Spates, MD;  Location: [facility];  Service: Endoscopy;  Laterality: N/A;  ? 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 KNEE ARTHROSCOPY WITH LATERAL MENISECTOMY Left 02/27/2016  ? Procedure: KNEE ARTHROSCOPY WITH LATERAL and MEDIAL MENISCECTOMY, left;  Surgeon: Elsie Saas, MD;  Location: [place];  Service: Orthopedics;  Laterality: Left;  ? LAPAROSCOPIC BILATERAL SALPINGO  OOPHERECTOMY Bilateral 07/06/2018  ? Procedure: LAPAROSCOPIC BILATERAL SALPINGO OOPHORECTOMY;  Surgeon: Ward, Honor Loh, MD;  Location: [facility];  Service: Gynecology;  Laterality: Bilateral;  ? LAPAROSCOPIC HYSTERECTOMY N/A 07/06/2018  ? Procedure: HYSTERECTOMY TOTAL LAPAROSCOPIC;  Surgeon: Ward, Honor Loh, MD;  Location: [facility];  Service: Gynecology;  Laterality: N/A;  ? REDUCTION MAMMAPLASTY Bilateral   ? SAVORY DILATION N/A 04/01/2019  ? Procedure: SAVORY DILATION;  Surgeon: Laurence Spates, MD;  Location: [facility];  Service: Endoscopy;  Laterality: N/A;  ? ?Patient Active Problem List  ? Diagnosis Date Noted  ? Hypertension   ? GERD (gastroesophageal reflux disease)   ? Family history of adverse reaction to anesthesia   ? Medial meniscus tear 01/30/2016  ? Lateral meniscal tear left knee 01/30/2016  ? Palpitations 07/31/2015  ? Ventricular premature beats 07/31/2015  ? Tachycardia, unspecified 07/31/2015  ? ? ?PCP: Lavone Orn, MD ? ?REFERRING PROVIDER: Lavone Orn, MD ? ?REFERRING DIAG: PT eval/tx for cervical modalities w/dryneedling and rt shoulder RTC per Elsie Saas, MD  ?THERAPY DIAG:  ?Cervicalgia ? ?Chronic right shoulder pain ? ?ONSET DATE: 09/04/2021 ?SUBJECTIVE:     Pt reports she has began the HEP without questions.  Reports pain can increase with positions.  ?                                                                                                                                                                                                   ? ?  PERTINENT HISTORY:  ?           OA, RT knee arthroscopic surgery, tachycardia  ?PAIN:  ?Are you having pain? Yes: NPRS scale: 2-3/10 ?Pain location: Rt shoulder blade  ?Pain description: throbbing at times sharp  ?Aggravating factors: sitting  ?Relieving factors: heat  ? ?PRECAUTIONS: Shoulder do not lift to much wt.  ? ?WEIGHT BEARING RESTRICTIONS No ? ?FALLS:  ?Has patient fallen in last 6 months?no ?Number of falls: 0 ? ?LIVING  ENVIRONMENT: ?Lives with: lives with their family and lives with their spouse ?Lives in: House/apartment ?OCCUPATION: retired  ? ?PLOF: Independent ? ?PATIENT GOALS to be fully healed and be able to use her arm again.  ? ?OBJECTIVE:  ? ?DIAGNOSTIC FINDINGS:  ?IMPRESSION: ?1. Potentially torn humeral attachment of the inferior glenohumeral ?ligament with substantial indistinctness of this attachment and ?surrounding ill-defined edema. ?2. Irregular anterior inferior labrum may be torn. ?3. Moderate supraspinatus tendinopathy with some linear high signal ?tracking within the supraspinatus tendon. This definitively reaches ?the articular surface and is probably a partial thickness articular ?surface tear. Pinpoint full-thickness partial width extension to the ?bursal surface is not definitively shown. ?4. Subacromial subdeltoid bursitis. Borderline appearance for ?glenohumeral joint effusion. ?5. Mild supraspinatus muscular strain. ?6. Mild infraspinatus tendinopathy. ? ?PATIENT SURVEYS:  ?FOTO 47 ? ? ?COGNITION: ?Overall cognitive status: Within functional limits for tasks assessed ? ? ?SENSATION: ?WFL ? ?POSTURE:  ?WFL ? ?PALPATION: ?Tender to palpation of rotator cuff mm  ? ?CERVICAL ROM:  ? ?Active ROM A/PROM (deg) ?09/26/2021  ?Flexion WNL; reps increases pain   ?Extension WFL; reps slightly worsens sx   ?Right lateral flexion Wnl ?  ?Left lateral flexion WNL  ?Right rotation WNL  ?Left rotation WNL   ? (Blank rows = not tested) ? ?UE ROM: ? ?Active ROM Right ?09/26/2021 Left ?09/26/2021  ?Shoulder flexion 140 160  ?Shoulder extension    ?Shoulder abduction 80   ?Shoulder adduction    ?Shoulder extension    ?Shoulder internal rotation 60   ?Shoulder external rotation 40   ? ?UE MMT: ? ?MMT Right ?09/26/2021 Left ?09/26/2021  ?Shoulder flexion 3+/5   ?Shoulder extension    ?Shoulder abduction 3+/5   ?Shoulder adduction    ?Shoulder extension    ?Shoulder internal rotation 4/5   ?Shoulder external rotation 3+/5    ?CERVICAL SPECIAL TESTS:  ?Cervical strength:  extension:   4/5 ?                              Rt Sidebend: 4-/5 ?                              Lt sidebend: 4-/5  ? ? ? ?TODAY'S TREATMENT:  ?09/26/21: ? Reviewed goals ?Seated:  ? Cervical Isometric sidebend and rotation 5x5" ? Cervical retraction 10x 5" ? Scapular retraction 10x 5" ? ER ? WBack ?Sidelying: ? ER ?Standing: ? Pulley flex and abd ? Arm slide 10x ? Pec stretch 1x 30" painful ? ?Eval: ?Cervical retraction x 10  ?Wand exercises: ?Flexion x 10 ?Abduction x 10 ?Supine ER/IR x 10  ? ? ?PATIENT EDUCATION:  ?Education details: Reviewed goals, educated importance of HEP compliance for maximal benefits, pt able to recall and demonstrate appropriate  ?Person educated: Patient ?Education method: Explanation, Tactile cues, and Handouts ?Education comprehension: verbalized understanding and returned demonstration ? ? ?HOME EXERCISE PROGRAM: ?Cervical  retraction x 10  ?Wand exercises: ?Flexion x 10 ?Abduction x 10 ?Supine ER/IR x 10 ?3/29: Wback and isometric ? ?ASSESSMENT: ? ?CLINICAL IMPRESSION: ?Reviewed goals, educated importance of HEP compliance for maximal benefits, pt able to recall and demonstrate appropriate mechanics.  Session focus with shoulder mobility and postural strengthening.  Pt tolerated well with session with good form and mechanics following initial cueing.  Added isometrics and wback to HEP for strengthening with printout given.  ? ? ?OBJECTIVE IMPAIRMENTS decreased activity tolerance, decreased ROM, decreased strength, increased fascial restrictions, impaired flexibility, impaired UE functional use, and pain.  ? ?ACTIVITY LIMITATIONS cleaning, driving, yard work, and yard work.  ? ?PERSONAL FACTORS Age, Past/current experiences, and 1 comorbidity: Past rotator cuff injury  are also affecting patient's functional outcome.  ? ? ?REHAB POTENTIAL: Good ? ?CLINICAL DECISION MAKING: Stable/uncomplicated ? ?EVALUATION COMPLEXITY:  Moderate ? ? ?GOALS: ?Goals reviewed with patient? No ? ?SHORT TERM GOALS: Target date: 10/17/2021 ? ?Pt to have full  right shoulder AROM ?Baseline: see above  ?Goal status: Ongoing ? ?2.  PT pain to be no greater than a 5/10 with full ROM of Rt

## 2021-10-03 ENCOUNTER — Ambulatory Visit (HOSPITAL_COMMUNITY): Payer: Medicare Other | Attending: Orthopedic Surgery | Admitting: Physical Therapy

## 2021-10-03 ENCOUNTER — Encounter (HOSPITAL_COMMUNITY): Payer: Self-pay | Admitting: Physical Therapy

## 2021-10-03 DIAGNOSIS — G8929 Other chronic pain: Secondary | ICD-10-CM | POA: Insufficient documentation

## 2021-10-03 DIAGNOSIS — M542 Cervicalgia: Secondary | ICD-10-CM | POA: Diagnosis not present

## 2021-10-03 DIAGNOSIS — M25511 Pain in right shoulder: Secondary | ICD-10-CM | POA: Insufficient documentation

## 2021-10-03 NOTE — Therapy (Signed)
?OUTPATIENT PHYSICAL THERAPY TREATMENT NOTE ? ? ?Patient Name: Nicole Newman ?MRN: [id number] ?DOB:15-Feb-1951, 71 y.o., female ?Today's Date: 10/03/2021 ? ?PCP: Lavone Orn, MD ?REFERRING PROVIDER: Elsie Saas, MD  ? ?END OF SESSION:  ? PT End of Session - 10/03/21 1444   ? ? Visit Number 3   ? Number of Visits 12   ? Date for PT Re-Evaluation 11/05/21   ? Authorization Type BCBS   ? PT Start Time 1443   ? PT Stop Time 0092   ? PT Time Calculation (min) 38 min   ? Activity Tolerance Patient tolerated treatment well   ? Behavior During Therapy [facility] for tasks assessed/performed   ? ?  ?  ? ?  ? ? ?Past Medical History:  ?Diagnosis Date  ? Dysrhythmia   ? PVC'S 2017  ? Family history of adverse reaction to anesthesia   ? states mother had small MI during anesthesia  ? GERD (gastroesophageal reflux disease)   ? High cholesterol   ? Hypertension   ? states under control with med., has been on med. x 2.5 yr.  ? Lateral meniscal tear left knee 01/2016  ? left  ? Medial meniscus tear left knee 01/2016  ? left  ? PONV (postoperative nausea and vomiting)   ? PVC's (premature ventricular contractions)   ? ?Past Surgical History:  ?Procedure Laterality Date  ? APPENDECTOMY    ? BREAST BIOPSY Right 11/17/2012  ? [place]  ? BREAST REDUCTION SURGERY    ? CHONDROPLASTY Left 02/27/2016  ? Procedure: CHONDROPLASTY;  Surgeon: Elsie Saas, MD;  Location: [place];  Service: Orthopedics;  Laterality: Left;  ? DIAGNOSTIC LAPAROSCOPY    ? ESOPHAGEAL DILATION    ? ESOPHAGOGASTRODUODENOSCOPY (EGD) WITH PROPOFOL N/A 04/01/2019  ? Procedure: ESOPHAGOGASTRODUODENOSCOPY (EGD) WITH PROPOFOL;  Surgeon: Laurence Spates, MD;  [facility];  Service: Endoscopy;  Laterality: N/A;  ? 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 KNEE ARTHROSCOPY WITH LATERAL MENISECTOMY Left 02/27/2016  ? Procedure: KNEE ARTHROSCOPY WITH LATERAL and MEDIAL MENISCECTOMY, left;  Surgeon: Elsie Saas, MD;  Location: [place];   Service: Orthopedics;  Laterality: Left;  ? LAPAROSCOPIC BILATERAL SALPINGO OOPHERECTOMY Bilateral 07/06/2018  ? Procedure: LAPAROSCOPIC BILATERAL SALPINGO OOPHORECTOMY;  Surgeon: Ward, Honor Loh, MD;  Location: [facility];  Service: Gynecology;  Laterality: Bilateral;  ? LAPAROSCOPIC HYSTERECTOMY N/A 07/06/2018  ? Procedure: HYSTERECTOMY TOTAL LAPAROSCOPIC;  Surgeon: Ward, Honor Loh, MD;  Location: [facility];  Service: Gynecology;  Laterality: N/A;  ? REDUCTION MAMMAPLASTY Bilateral   ? SAVORY DILATION N/A 04/01/2019  ? Procedure: SAVORY DILATION;  Surgeon: Laurence Spates, MD;  Location[facility];  Service: Endoscopy;  Laterality: N/A;  ? ?Patient Active Problem List  ? Diagnosis Date Noted  ? Hypertension   ? GERD (gastroesophageal reflux disease)   ? Family history of adverse reaction to anesthesia   ? Medial meniscus tear 01/30/2016  ? Lateral meniscal tear left knee 01/30/2016  ? Palpitations 07/31/2015  ? Ventricular premature beats 07/31/2015  ? Tachycardia, unspecified 07/31/2015  ? ? ?REFERRING DIAG: PT eval/tx for cervical modalities w/dryneedling and rt shoulder RTC per Elsie Saas, MD   ? ?THERAPY DIAG:  ?Cervicalgia ? ?Chronic right shoulder pain ? ?PERTINENT HISTORY: OA, RT knee arthroscopic surgery, tachycardia  ? ?PRECAUTIONS: Shoulder do not lift to much wt.  ? ?SUBJECTIVE: Symptoms worse with sitting and driving. Pain in R rhomboid region. Shoulder has been bothering her since painting. Doesn't do laying exercises  as they are not as convenient  ? ?PAIN:  ?Are you having pain? Yes: NPRS scale: 1-2/10 ?Pain location: R shoulder ?Pain description: sore ?Aggravating factors: sitting, driving ?Relieving factors: movement ? ? ? ? ?OBJECTIVE:  ?  ?CERVICAL ROM:  ?  ?Active ROM A/PROM (deg) ?09/24/2021  ?Flexion WNL; reps increases pain   ?Extension WFL; reps slightly worsens sx   ?Right lateral flexion Wnl ?   ?Left lateral flexion WNL  ?Right rotation WNL  ?Left rotation WNL   ? (Blank rows = not tested) ?   ?UE ROM: ?  ?Active ROM Right ?09/24/2021 Left ?09/24/2021 Right 10/03/21  ?Shoulder flexion 140 160 140 stiff/sore  ?Shoulder extension       ?Shoulder abduction 80   140 stiff/sore  ?Shoulder adduction       ?Shoulder extension       ?Shoulder internal rotation 60     ?Shoulder external rotation 40     ?  ?UE MMT: ?  ?MMT Right ?09/24/2021 Left ?09/24/2021  ?Shoulder flexion 3+/5    ?Shoulder extension      ?Shoulder abduction 3+/5    ?Shoulder adduction      ?Shoulder extension      ?Shoulder internal rotation 4/5    ?Shoulder external rotation 3+/5    ?CERVICAL SPECIAL TESTS:  ?Cervical strength:  extension:   4/5 ?                              Rt Sidebend: 4-/5 ?                              Lt sidebend: 4-/5  ?  ?  ?  ?TODAY'S TREATMENT:  ?10/03/21 ?UBE 4 minutes backward level 1 ?Shoulder isometrics in standing 5 x 10 second holds each - flexion, abduction, extension, ER, IR with shoulder in neutral  ?Self STM with tennis ball to Rhomboids - 9 minutes ?Row - red band 2x 15 ?Shoulder extension - red band 2x 15  ?Scap retraction with [facility] ER 2x 10  ? ?09/26/21: ?           Reviewed goals ?Seated:           ?           Cervical Isometric sidebend and rotation 5x5" ?           Cervical retraction 10x 5" ?           Scapular retraction 10x 5" ?           ER ?           WBack ?Sidelying: ?           ER ?Standing: ?           Pulley flex and abd ?           Arm slide 10x ?           Pec stretch 1x 30" painful ?  ?Eval: ?Cervical retraction x 10  ?Wand exercises: ?Flexion x 10 ?Abduction x 10 ?Supine ER/IR x 10  ?  ?  ?PATIENT EDUCATION:  ?Education details: HEP ?Person educated: Patient ?Education method: Explanation, Tactile cues, and Handouts ?Education comprehension: verbalized understanding and returned demonstration ?  ?  ?HOME EXERCISE PROGRAM: ?Cervical retraction x 10  ?Wand exercises: ?Flexion x 10 ?Abduction x 10 ?Supine ER/IR x 10 ?3/29: Darrin Luis  and isometric ?  ?ASSESSMENT: ?  ?CLINICAL IMPRESSION: ?Began session  with UBE for dynamic warm up and postural strengthening which is tolerated well. Able to perform shoulder isometrics without issue. Patient demonstrating good ROM with c/o stiffness/soreness at end range. Patient TTP to R rhomboids, began self STM with ball with decrease in symptoms. Intermittent c/o R shoulder symptoms with resistance exercises which decrease with reducing strain and seize immediately following exercise. Patient will continue to benefit from physical therapy in order to improve function and reduce impairment. ? ?  ?  ?OBJECTIVE IMPAIRMENTS decreased activity tolerance, decreased ROM, decreased strength, increased fascial restrictions, impaired flexibility, impaired UE functional use, and pain.  ?  ?ACTIVITY LIMITATIONS cleaning, driving, yard work, and yard work.  ?  ?PERSONAL FACTORS Age, Past/current experiences, and 1 comorbidity: Past rotator cuff injury  are also affecting patient's functional outcome.  ?  ?  ?REHAB POTENTIAL: Good ?  ?CLINICAL DECISION MAKING: Stable/uncomplicated ?  ?EVALUATION COMPLEXITY: Moderate ?  ?  ?GOALS: ?Goals reviewed with patient? No ?  ?SHORT TERM GOALS: Target date: 10/17/2021 ?  ?Pt to have full  right shoulder AROM ?Baseline: see above  ?Goal status: Ongoing ?  ?2.  PT pain to be no greater than a 5/10 with full ROM of Rt shoulder ?Baseline: see above  ?Goal status: Ongoing ?  ?3.  Pt to demonstrate good cervical stability while raising both UE into flexion  ?Baseline: pt goes into cervical protraction  ?Goal status: Ongoing ?  ?  ?  ?LONG TERM GOALS: Target date: 11/07/2021 ?  ?PT to be I in an advance HEP to improve RT shoulder and cervcial strength to at least 4+/5 ?Baseline: see above ?Goal status: Ongoing ?  ?2.  PT to be able to lift her 2yo granddaughter without any pain  ?Baseline: pain  ?Goal status: Ongoing ?  ?3.  Pt to be able to clean/ complete yard work for two hours without experiencing increased Lt shoulder pain  ?Baseline: see above  ?Goal  status: Ongoing ?  ?4.  Right shoulder pain to be no greater than a 2/10 ?Baseline: goes up as high as a 7 ?Goal status: Ongoing ?  ?  ?PLAN: ?PT FREQUENCY: 2x/week ?  ?PT DURATION: 6 weeks ?  ?PLANNED INTERVEN

## 2021-10-03 NOTE — Patient Instructions (Signed)
Access Code: [email or web address] ?URL: [email or web address] ?Date: 10/03/2021 ?Prepared by: Mitzi Hansen Odysseus Cada ? ?Exercises ?- Shoulder External Rotation and Scapular Retraction with Resistance  - 1 x daily - 7 x weekly - 3 sets - 10 reps ?

## 2021-10-05 ENCOUNTER — Ambulatory Visit (HOSPITAL_COMMUNITY): Payer: Medicare Other | Admitting: Physical Therapy

## 2021-10-05 ENCOUNTER — Encounter (HOSPITAL_COMMUNITY): Payer: Self-pay | Admitting: Physical Therapy

## 2021-10-05 DIAGNOSIS — M542 Cervicalgia: Secondary | ICD-10-CM | POA: Diagnosis not present

## 2021-10-05 DIAGNOSIS — G8929 Other chronic pain: Secondary | ICD-10-CM | POA: Diagnosis not present

## 2021-10-05 DIAGNOSIS — M25511 Pain in right shoulder: Secondary | ICD-10-CM | POA: Diagnosis not present

## 2021-10-05 NOTE — Therapy (Signed)
?OUTPATIENT PHYSICAL THERAPY TREATMENT NOTE ? ? ?Patient Name: Nicole Newman ?MRN: [id number] ?DOB:1950/11/08, 71 y.o., female ?Today's Date: 10/05/2021 ? ?PCP: Lavone Orn, MD ?REFERRING PROVIDER: Elsie Saas, MD  ? ?END OF SESSION:  ? PT End of Session - 10/05/21 2536   ? ? Visit Number 4   ? Number of Visits 12   ? Date for PT Re-Evaluation 11/05/21   ? Authorization Type BCBS   ? PT Start Time 0820   ? PT Stop Time 0858   ? PT Time Calculation (min) 38 min   ? Activity Tolerance Patient tolerated treatment well   ? Behavior During Therapy [facility] for tasks assessed/performed   ? ?  ?  ? ?  ? ? ?Past Medical History:  ?Diagnosis Date  ? Dysrhythmia   ? PVC'S 2017  ? Family history of adverse reaction to anesthesia   ? states mother had small MI during anesthesia  ? GERD (gastroesophageal reflux disease)   ? High cholesterol   ? Hypertension   ? states under control with med., has been on med. x 2.5 yr.  ? Lateral meniscal tear left knee 01/2016  ? left  ? Medial meniscus tear left knee 01/2016  ? left  ? PONV (postoperative nausea and vomiting)   ? PVC's (premature ventricular contractions)   ? ?Past Surgical History:  ?Procedure Laterality Date  ? APPENDECTOMY    ? BREAST BIOPSY Right 11/17/2012  ? [place]  ? BREAST REDUCTION SURGERY    ? CHONDROPLASTY Left 02/27/2016  ? Procedure: CHONDROPLASTY;  Surgeon: Elsie Saas, MD;  Location: [place];  Service: Orthopedics;  Laterality: Left;  ? DIAGNOSTIC LAPAROSCOPY    ? ESOPHAGEAL DILATION    ? ESOPHAGOGASTRODUODENOSCOPY (EGD) WITH PROPOFOL N/A 04/01/2019  ? Procedure: ESOPHAGOGASTRODUODENOSCOPY (EGD) WITH PROPOFOL;  Surgeon: Laurence Spates, MD;  Location: [facility];  Service: Endoscopy;  Laterality: N/A;  ? 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 KNEE ARTHROSCOPY WITH LATERAL MENISECTOMY Left 02/27/2016  ? Procedure: KNEE ARTHROSCOPY WITH LATERAL and MEDIAL MENISCECTOMY, left;  Surgeon: Elsie Saas, MD;  Location: [place];   Service: Orthopedics;  Laterality: Left;  ? LAPAROSCOPIC BILATERAL SALPINGO OOPHERECTOMY Bilateral 07/06/2018  ? Procedure: LAPAROSCOPIC BILATERAL SALPINGO OOPHORECTOMY;  Surgeon: Ward, Honor Loh, MD;  Location: [facility];  Service: Gynecology;  Laterality: Bilateral;  ? LAPAROSCOPIC HYSTERECTOMY N/A 07/06/2018  ? Procedure: HYSTERECTOMY TOTAL LAPAROSCOPIC;  Surgeon: Ward, Honor Loh, MD;  Location: [facility];  Service: Gynecology;  Laterality: N/A;  ? REDUCTION MAMMAPLASTY Bilateral   ? SAVORY DILATION N/A 04/01/2019  ? Procedure: SAVORY DILATION;  Surgeon: Laurence Spates, MD;  Location: [facility];  Service: Endoscopy;  Laterality: N/A;  ? ?Patient Active Problem List  ? Diagnosis Date Noted  ? Hypertension   ? GERD (gastroesophageal reflux disease)   ? Family history of adverse reaction to anesthesia   ? Medial meniscus tear 01/30/2016  ? Lateral meniscal tear left knee 01/30/2016  ? Palpitations 07/31/2015  ? Ventricular premature beats 07/31/2015  ? Tachycardia, unspecified 07/31/2015  ? ? ?REFERRING DIAG: PT eval/tx for cervical modalities w/dryneedling and rt shoulder RTC per Elsie Saas, MD   ? ?THERAPY DIAG:  ?Cervicalgia ? ?Chronic right shoulder pain ? ?PERTINENT HISTORY: OA, RT knee arthroscopic surgery, tachycardia  ? ?PRECAUTIONS: Shoulder do not lift to much wt.  ? ?SUBJECTIVE: Not sure what she did but her shoulder is really hurting today. Maybe lifting a ham yesterday. It is sore this AM.  ? ?  PAIN:  ?Are you having pain? Yes: NPRS scale: 3-4/10 ?Pain location: R shoulder ?Pain description: sore ?Aggravating factors: sitting, driving ?Relieving factors: movement ? ? ? ? ?OBJECTIVE:  ?  ?CERVICAL ROM:  ?  ?Active ROM A/PROM (deg) ?09/24/2021  ?Flexion WNL; reps increases pain   ?Extension WFL; reps slightly worsens sx   ?Right lateral flexion Wnl ?   ?Left lateral flexion WNL  ?Right rotation WNL  ?Left rotation WNL   ? (Blank rows = not tested) ?  ?UE ROM: ?  ?Active ROM Right ?09/24/2021 Left ?09/24/2021  Right 10/03/21  ?Shoulder flexion 140 160 140 stiff/sore  ?Shoulder extension       ?Shoulder abduction 80   140 stiff/sore  ?Shoulder adduction       ?Shoulder extension       ?Shoulder internal rotation 60     ?Shoulder external rotation 40     ?  ?UE MMT: ?  ?MMT Right ?09/24/2021 Left ?09/24/2021  ?Shoulder flexion 3+/5    ?Shoulder extension      ?Shoulder abduction 3+/5    ?Shoulder adduction      ?Shoulder extension      ?Shoulder internal rotation 4/5    ?Shoulder external rotation 3+/5    ?CERVICAL SPECIAL TESTS:  ?Cervical strength:  extension:   4/5 ?                              Rt Sidebend: 4-/5 ?                              Lt sidebend: 4-/5  ?  ?  ?  ?TODAY'S TREATMENT:  ?10/05/21 ?UBE 3/3 FWD/ back ?Chin tuck x15  ?RTB rows 2 x 15 ?Shoulder extension RTB 2 x 15 ?RT shoulder IR  GTB 2 x 15 ?Rt shoulder ER isometric walkouts RTB 10 x 10"  ? ? ?10/03/21 ?UBE 4 minutes backward level 1 ?Shoulder isometrics in standing 5 x 10 second holds each - flexion, abduction, extension, ER, IR with shoulder in neutral  ?Self STM with tennis ball to Rhomboids - 9 minutes ?Row - red band 2x 15 ?Shoulder extension - red band 2x 15  ?Scap retraction with [facility] ER 2x 10  ? ?09/26/21: ?           Reviewed goals ?Seated:           ?           Cervical Isometric sidebend and rotation 5x5" ?           Cervical retraction 10x 5" ?           Scapular retraction 10x 5" ?           ER ?           WBack ?Sidelying: ?           ER ?Standing: ?           Pulley flex and abd ?           Arm slide 10x ?           Pec stretch 1x 30" painful ?  ?Eval: ?Cervical retraction x 10  ?Wand exercises: ?Flexion x 10 ?Abduction x 10 ?Supine ER/IR x 10  ?  ?  ?PATIENT EDUCATION:  ?Education details: HEP ?Person educated: Patient ?Education method: Explanation, Tactile cues, and Handouts ?Education  comprehension: verbalized understanding and returned demonstration ?  ?  ?HOME EXERCISE PROGRAM: ?Cervical retraction x 10  ?Wand exercises: ?Flexion x  10 ?Abduction x 10 ?Supine ER/IR x 10 ?3/29: Wback and isometric ?  ?ASSESSMENT: ?  ?CLINICAL IMPRESSION: ?Graded activity per patient tolerance. Educated patient on shoulder anatomy and likely cause of shoulder and arm pain. Explained rational for rotator cuff and scapular strengthening exercise with visual demonstration. Patient tolerated today's session well with no increased complaint of pain. Added shoulder ER isometrics to HEP.ASSESSMENT:ASSESSMENT: Patient will continue to benefit from skilled therapy services to reduce remaining deficits and improve functional ability.  ? ? ?  ?  ?OBJECTIVE IMPAIRMENTS decreased activity tolerance, decreased ROM, decreased strength, increased fascial restrictions, impaired flexibility, impaired UE functional use, and pain.  ?  ?ACTIVITY LIMITATIONS cleaning, driving, yard work, and yard work.  ?  ?PERSONAL FACTORS Age, Past/current experiences, and 1 comorbidity: Past rotator cuff injury  are also affecting patient's functional outcome.  ?  ?  ?REHAB POTENTIAL: Good ?  ?CLINICAL DECISION MAKING: Stable/uncomplicated ?  ?EVALUATION COMPLEXITY: Moderate ?  ?  ?GOALS: ?Goals reviewed with patient? No ?  ?SHORT TERM GOALS: Target date: 10/17/2021 ?  ?Pt to have full  right shoulder AROM ?Baseline: see above  ?Goal status: Ongoing ?  ?2.  PT pain to be no greater than a 5/10 with full ROM of Rt shoulder ?Baseline: see above  ?Goal status: Ongoing ?  ?3.  Pt to demonstrate good cervical stability while raising both UE into flexion  ?Baseline: pt goes into cervical protraction  ?Goal status: Ongoing ?  ?  ?  ?LONG TERM GOALS: Target date: 11/07/2021 ?  ?PT to be I in an advance HEP to improve RT shoulder and cervcial strength to at least 4+/5 ?Baseline: see above ?Goal status: Ongoing ?  ?2.  PT to be able to lift her 2yo granddaughter without any pain  ?Baseline: pain  ?Goal status: Ongoing ?  ?3.  Pt to be able to clean/ complete yard work for two hours without experiencing  increased Lt shoulder pain  ?Baseline: see above  ?Goal status: Ongoing ?  ?4.  Right shoulder pain to be no greater than a 2/10 ?Baseline: goes up as high as a 7 ?Goal status: Ongoing ?  ?  ?PLAN: ?PT FREQUENCY: 2x/week

## 2021-10-09 ENCOUNTER — Ambulatory Visit (HOSPITAL_COMMUNITY): Payer: Medicare Other

## 2021-10-09 ENCOUNTER — Encounter (HOSPITAL_COMMUNITY): Payer: Self-pay

## 2021-10-09 DIAGNOSIS — M542 Cervicalgia: Secondary | ICD-10-CM

## 2021-10-09 DIAGNOSIS — M25511 Pain in right shoulder: Secondary | ICD-10-CM | POA: Diagnosis not present

## 2021-10-09 DIAGNOSIS — G8929 Other chronic pain: Secondary | ICD-10-CM

## 2021-10-09 NOTE — Therapy (Signed)
?OUTPATIENT PHYSICAL THERAPY TREATMENT NOTE ? ? ?Patient Name: Nicole Newman ?MRN: [id number] ?DOB:02-09-1951, 71 y.o., female ?Today's Date: 10/09/2021 ? ?PCP: Lavone Orn, MD ?REFERRING PROVIDER: Elsie Saas, MD  ? ?END OF SESSION:  ? PT End of Session - 10/09/21 6433   ? ? Visit Number 5   ? Number of Visits 12   ? Date for PT Re-Evaluation 11/05/21   ? Authorization Type BCBS   ? PT Start Time 0815   ? PT Stop Time 0855   ? PT Time Calculation (min) 40 min   ? Activity Tolerance Patient tolerated treatment well   ? Behavior During Therapy [facility] for tasks assessed/performed   ? ?  ?  ? ?  ? ? ?Past Medical History:  ?Diagnosis Date  ? Dysrhythmia   ? PVC'S 2017  ? Family history of adverse reaction to anesthesia   ? states mother had small MI during anesthesia  ? GERD (gastroesophageal reflux disease)   ? High cholesterol   ? Hypertension   ? states under control with med., has been on med. x 2.5 yr.  ? Lateral meniscal tear left knee 01/2016  ? left  ? Medial meniscus tear left knee 01/2016  ? left  ? PONV (postoperative nausea and vomiting)   ? PVC's (premature ventricular contractions)   ? ?Past Surgical History:  ?Procedure Laterality Date  ? APPENDECTOMY    ? BREAST BIOPSY Right 11/17/2012  ? [place]  ? BREAST REDUCTION SURGERY    ? CHONDROPLASTY Left 02/27/2016  ? Procedure: CHONDROPLASTY;  Surgeon: Elsie Saas, MD;  Location: [place];  Service: Orthopedics;  Laterality: Left;  ? DIAGNOSTIC LAPAROSCOPY    ? ESOPHAGEAL DILATION    ? ESOPHAGOGASTRODUODENOSCOPY (EGD) WITH PROPOFOL N/A 04/01/2019  ? Procedure: ESOPHAGOGASTRODUODENOSCOPY (EGD) WITH PROPOFOL;  Surgeon: Laurence Spates, MD;  [facility];  Service: Endoscopy;  Laterality: N/A;  ? 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 KNEE ARTHROSCOPY WITH LATERAL MENISECTOMY Left 02/27/2016  ? Procedure: KNEE ARTHROSCOPY WITH LATERAL and MEDIAL MENISCECTOMY, left;  Surgeon: Elsie Saas, MD;  Location: [place];   Service: Orthopedics;  Laterality: Left;  ? LAPAROSCOPIC BILATERAL SALPINGO OOPHERECTOMY Bilateral 07/06/2018  ? Procedure: LAPAROSCOPIC BILATERAL SALPINGO OOPHORECTOMY;  Surgeon: Ward, Honor Loh, MD;  Location: [facility];  Service: Gynecology;  Laterality: Bilateral;  ? LAPAROSCOPIC HYSTERECTOMY N/A 07/06/2018  ? Procedure: HYSTERECTOMY TOTAL LAPAROSCOPIC;  Surgeon: Ward, Honor Loh, MD;  Location: [facility];  Service: Gynecology;  Laterality: N/A;  ? REDUCTION MAMMAPLASTY Bilateral   ? SAVORY DILATION N/A 04/01/2019  ? Procedure: SAVORY DILATION;  Surgeon: Laurence Spates, MD;  [facility];  Service: Endoscopy;  Laterality: N/A;  ? ?Patient Active Problem List  ? Diagnosis Date Noted  ? Hypertension   ? GERD (gastroesophageal reflux disease)   ? Family history of adverse reaction to anesthesia   ? Medial meniscus tear 01/30/2016  ? Lateral meniscal tear left knee 01/30/2016  ? Palpitations 07/31/2015  ? Ventricular premature beats 07/31/2015  ? Tachycardia, unspecified 07/31/2015  ? ? ?REFERRING DIAG: PT eval/tx for cervical modalities w/dryneedling and rt shoulder RTC per Elsie Saas, MD   ? ?THERAPY DIAG:  ?Cervicalgia ? ?Chronic right shoulder pain ? ?PERTINENT HISTORY: OA, RT knee arthroscopic surgery, tachycardia  ? ?PRECAUTIONS: Shoulder do not lift to much wt.  ? ?SUBJECTIVE: Actual shoulder blade pain is getting better with the neck exercises however she states her lateral shoulder now is more and some  radiating pain down into arm.  ? ?PAIN:  ?Are you having pain? Yes: NPRS scale: 3-4/10 ?Pain location: R shoulder joint line ?Pain description: sore, annoying ache ?Aggravating factors: sitting, driving, lifting ?Relieving factors: movement, heat ? ? ? ? ?OBJECTIVE:  ?  ?CERVICAL ROM:  ?  ?Active ROM A/PROM (deg) ?09/24/2021  ?Flexion WNL; reps increases pain   ?Extension WFL; reps slightly worsens sx   ?Right lateral flexion Wnl ?   ?Left lateral flexion WNL  ?Right rotation WNL  ?Left rotation WNL   ?  (Blank rows = not tested) ?  ?UE ROM: ?  ?Active ROM Right ?09/24/2021 Left ?09/24/2021 Right 10/03/21  ?Shoulder flexion 140 160 140 stiff/sore  ?Shoulder extension       ?Shoulder abduction 80   140 stiff/sore  ?Shoulder adduction       ?Shoulder extension       ?Shoulder internal rotation 60     ?Shoulder external rotation 40     ?  ?UE MMT: ?  ?MMT Right ?09/24/2021 Left ?09/24/2021  ?Shoulder flexion 3+/5    ?Shoulder extension      ?Shoulder abduction 3+/5    ?Shoulder adduction      ?Shoulder extension      ?Shoulder internal rotation 4/5    ?Shoulder external rotation 3+/5    ?CERVICAL SPECIAL TESTS:  ?Cervical strength:  extension:   4/5 ?                              Rt Sidebend: 4-/5 ?                              Lt sidebend: 4-/5  ?  ?  ?  ?TODAY'S TREATMENT:  ?10/09/21: ?UBE 3/3 FWD/ back ?Chin tuck and tall posture cued for all activities today ?RTB rows 2 x 15 ?RTB tricep extensions 2 x 15  cue for holding scap retraction ?Shoulder extension RTB 2 x 15 ?Right shoulder IR  RTB 2 x 15 ?Right shoulder adduction RTB 2 x 15 ?Rt shoulder ER  RTB 2 x 15 ?Wall wash flexion 1 x 15 ?Wall wash abduction 1 x 10 ?Shoulder rolls backward ? ? ? ?10/05/21 ?UBE 3/3 FWD/ back ?Chin tuck x15  ?RTB rows 2 x 15 ?Shoulder extension RTB 2 x 15 ?RT shoulder IR  GTB 2 x 15 ?Rt shoulder ER isometric walkouts RTB 10 x 10"  ? ? ?10/03/21 ?UBE 4 minutes backward level 1 ?Shoulder isometrics in standing 5 x 10 second holds each - flexion, abduction, extension, ER, IR with shoulder in neutral  ?Self STM with tennis ball to Rhomboids - 9 minutes ?Row - red band 2x 15 ?Shoulder extension - red band 2x 15  ?Scap retraction with [facility] ER 2x 10  ? ?09/26/21: ?           Reviewed goals ?Seated:           ?           Cervical Isometric sidebend and rotation 5x5" ?           Cervical retraction 10x 5" ?           Scapular retraction 10x 5" ?           ER ?           WBack ?Sidelying: ?  ER ?Standing: ?           Pulley flex and abd ?            Arm slide 10x ?           Pec stretch 1x 30" painful ?  ?Eval: ?Cervical retraction x 10  ?Wand exercises: ?Flexion x 10 ?Abduction x 10 ?Supine ER/IR x 10  ?  ?  ?PATIENT EDUCATION:  ?Education details: HEP ?Person educated: Patient ?Education method: Explanation, Tactile cues, and Handouts ?Education comprehension: verbalized understanding and returned demonstration ?  ?  ?HOME EXERCISE PROGRAM: ?Cervical retraction x 10  ?Wand exercises: ?Flexion x 10 ?Abduction x 10 ?Supine ER/IR x 10 ?3/29: Wback and isometric ?  ?ASSESSMENT: ?  ?CLINICAL IMPRESSION: ?Today's session continued to build on scapular stabilization and building more RTC strengthening secondary to patient pain patterning. Demonstrating good ability to hold postural alignment and chin tuck during all activities. Addition of shoulder adduction and tricep extension for continued gross strengthening and reciprocal inhibition of painful region.  Patient will continue to benefit from skilled therapy services to reduce remaining deficits and improve functional ability.  ? ? ?  ?  ?OBJECTIVE IMPAIRMENTS decreased activity tolerance, decreased ROM, decreased strength, increased fascial restrictions, impaired flexibility, impaired UE functional use, and pain.  ?  ?ACTIVITY LIMITATIONS cleaning, driving, yard work, and yard work.  ?  ?PERSONAL FACTORS Age, Past/current experiences, and 1 comorbidity: Past rotator cuff injury  are also affecting patient's functional outcome.  ?  ?  ?REHAB POTENTIAL: Good ?  ?CLINICAL DECISION MAKING: Stable/uncomplicated ?  ?EVALUATION COMPLEXITY: Moderate ?  ?  ?GOALS: ?Goals reviewed with patient? No ?  ?SHORT TERM GOALS: Target date: 10/17/2021 ?  ?Pt to have full  right shoulder AROM ?Baseline: see above  ?Goal status: Ongoing ?  ?2.  PT pain to be no greater than a 5/10 with full ROM of Rt shoulder ?Baseline: see above  ?Goal status: Ongoing ?  ?3.  Pt to demonstrate good cervical stability while raising both UE into  flexion  ?Baseline: pt goes into cervical protraction  ?Goal status: Ongoing ?  ?  ?  ?LONG TERM GOALS: Target date: 11/07/2021 ?  ?PT to be I in an advance HEP to improve RT shoulder and cervcial strength to a

## 2021-10-10 ENCOUNTER — Ambulatory Visit
Admission: RE | Admit: 2021-10-10 | Discharge: 2021-10-10 | Disposition: A | Discharge status: Home / Self Care | Payer: Medicare Other | Source: Ambulatory Visit | Attending: Internal Medicine | Admitting: Internal Medicine

## 2021-10-10 DIAGNOSIS — Z1231 Encounter for screening mammogram for malignant neoplasm of breast: Secondary | ICD-10-CM

## 2021-10-11 ENCOUNTER — Ambulatory Visit (HOSPITAL_COMMUNITY): Payer: Medicare Other | Admitting: Physical Therapy

## 2021-10-11 DIAGNOSIS — M542 Cervicalgia: Secondary | ICD-10-CM | POA: Diagnosis not present

## 2021-10-11 DIAGNOSIS — M25511 Pain in right shoulder: Secondary | ICD-10-CM | POA: Diagnosis not present

## 2021-10-11 DIAGNOSIS — G8929 Other chronic pain: Secondary | ICD-10-CM | POA: Diagnosis not present

## 2021-10-11 NOTE — Therapy (Signed)
?OUTPATIENT PHYSICAL THERAPY TREATMENT NOTE ? ? ?Patient Name: Nicole Newman ?MRN: [id number] ?DOB:06-Mar-1951, 71 y.o., female ?Today's Date: 10/11/2021 ? ?PCP: Lavone Orn, MD ?REFERRING PROVIDER: Elsie Saas, MD  ? ?END OF SESSION:  ? PT End of Session - 10/11/21 1544   ? ? Visit Number 6   ? Number of Visits 12   ? Date for PT Re-Evaluation 11/05/21   ? Authorization Type BCBS   ? PT Start Time 1536   ? PT Stop Time 1615   ? PT Time Calculation (min) 39 min   ? Activity Tolerance Patient tolerated treatment well   ? Behavior During Therapy [facility] for tasks assessed/performed   ? ?  ?  ? ?  ? ? ?Past Medical History:  ?Diagnosis Date  ? Dysrhythmia   ? PVC'S 2017  ? Family history of adverse reaction to anesthesia   ? states mother had small MI during anesthesia  ? GERD (gastroesophageal reflux disease)   ? High cholesterol   ? Hypertension   ? states under control with med., has been on med. x 2.5 yr.  ? Lateral meniscal tear left knee 01/2016  ? left  ? Medial meniscus tear left knee 01/2016  ? left  ? PONV (postoperative nausea and vomiting)   ? PVC's (premature ventricular contractions)   ? ?Past Surgical History:  ?Procedure Laterality Date  ? APPENDECTOMY    ? BREAST BIOPSY Right 11/17/2012  ? [place]  ? BREAST REDUCTION SURGERY    ? CHONDROPLASTY Left 02/27/2016  ? Procedure: CHONDROPLASTY;  Surgeon: Elsie Saas, MD;  Location: [place];  Service: Orthopedics;  Laterality: Left;  ? DIAGNOSTIC LAPAROSCOPY    ? ESOPHAGEAL DILATION    ? ESOPHAGOGASTRODUODENOSCOPY (EGD) WITH PROPOFOL N/A 04/01/2019  ? Procedure: ESOPHAGOGASTRODUODENOSCOPY (EGD) WITH PROPOFOL;  Surgeon: Laurence Spates, MD;  Location: [facility];  Service: Endoscopy;  Laterality: N/A;  ? 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 KNEE ARTHROSCOPY WITH LATERAL MENISECTOMY Left 02/27/2016  ? Procedure: KNEE ARTHROSCOPY WITH LATERAL and MEDIAL MENISCECTOMY, left;  Surgeon: Elsie Saas, MD;  Location: [place];   Service: Orthopedics;  Laterality: Left;  ? LAPAROSCOPIC BILATERAL SALPINGO OOPHERECTOMY Bilateral 07/06/2018  ? Procedure: LAPAROSCOPIC BILATERAL SALPINGO OOPHORECTOMY;  Surgeon: Ward, Honor Loh, MD;  Location: [facility];  Service: Gynecology;  Laterality: Bilateral;  ? LAPAROSCOPIC HYSTERECTOMY N/A 07/06/2018  ? Procedure: HYSTERECTOMY TOTAL LAPAROSCOPIC;  Surgeon: Ward, Honor Loh, MD;  Location: [facility];  Service: Gynecology;  Laterality: N/A;  ? REDUCTION MAMMAPLASTY Bilateral   ? SAVORY DILATION N/A 04/01/2019  ? Procedure: SAVORY DILATION;  Surgeon: Laurence Spates, MD;  Location: [facility];  Service: Endoscopy;  Laterality: N/A;  ? ?Patient Active Problem List  ? Diagnosis Date Noted  ? Hypertension   ? GERD (gastroesophageal reflux disease)   ? Family history of adverse reaction to anesthesia   ? Medial meniscus tear 01/30/2016  ? Lateral meniscal tear left knee 01/30/2016  ? Palpitations 07/31/2015  ? Ventricular premature beats 07/31/2015  ? Tachycardia, unspecified 07/31/2015  ? ? ?REFERRING DIAG: PT eval/tx for cervical modalities w/dryneedling and rt shoulder RTC per Elsie Saas, MD   ? ?THERAPY DIAG:  ?Cervicalgia ? ?Chronic right shoulder pain ? ?PERTINENT HISTORY: OA, RT knee arthroscopic surgery, tachycardia  ? ?PRECAUTIONS: Shoulder do not lift to much wt.  ? ?SUBJECTIVE: Reports her shoulders are still hurting at worst 4/10 and running down into fingers.  Took aleve and now just around 1/10  without radiating pain.  STates she worked in her garden yesterday but tried not to use her Rt shoulder too much. ? ?PAIN:  ?Are you having pain? Yes: NPRS scale: 1/10 ?Pain location: R shoulder joint line ?Pain description: sore, annoying ache ?Aggravating factors: sitting, driving, lifting ?Relieving factors: movement, heat ? ? ? ? ?OBJECTIVE:  ? Below measures taken at initial evaluation on 3/27 ?CERVICAL ROM:  ?  ?Active ROM A/PROM (deg) ?09/24/2021  ?Flexion WNL; reps increases pain   ?Extension WFL; reps  slightly worsens sx   ?Right lateral flexion Wnl ?   ?Left lateral flexion WNL  ?Right rotation WNL  ?Left rotation WNL   ? (Blank rows = not tested) ?  ?UE ROM: ?  ?Active ROM Right ?09/24/2021 Left ?09/24/2021 Right 10/03/21  ?Shoulder flexion 140 160 140 stiff/sore  ?Shoulder extension       ?Shoulder abduction 80   140 stiff/sore  ?Shoulder adduction       ?Shoulder extension       ?Shoulder internal rotation 60     ?Shoulder external rotation 40     ?  ?UE MMT: ?  ?MMT Right ?09/24/2021 Left ?09/24/2021  ?Shoulder flexion 3+/5    ?Shoulder extension      ?Shoulder abduction 3+/5    ?Shoulder adduction      ?Shoulder extension      ?Shoulder internal rotation 4/5    ?Shoulder external rotation 3+/5    ? ?CERVICAL SPECIAL TESTS:  ?Cervical strength:  extension:   4/5 ?                              Rt Sidebend: 4-/5 ?                              Lt sidebend: 4-/5  ?  ?  ?  ?TODAY'S TREATMENT:  ? ?10/11/21: ?UBE 2/2' level 1  FWD/ back ?RTB  rows 2 x 15 ? tricep extensions 2 x 15  cue for holding scap retraction ?Shoulder extension RTB 2 x 15 ?Right shoulder IR  RTB 2 x 15 ?Right shoulder adduction RTB 2 x 15 ?Rt shoulder ER  RTB 2 x 15 ?Wall slide with washcloth flexion 1 x 15 ?Wall slide with washcloth abduction 1 x 15 ?Shoulder rolls backward 10X with chin tuck ?Wall wash (stab from shoulder with elbow straight) up/down 30" X 2 ?Wall wash Rt/Lt 30"X2 ?Thumbtacks bil 30"X2 ? ? ? ?10/09/21: ?UBE 3/3 FWD/ back ?Chin tuck and tall posture cued for all activities today ?RTB rows 2 x 15 ?RTB tricep extensions 2 x 15  cue for holding scap retraction ?Shoulder extension RTB 2 x 15 ?Right shoulder IR  RTB 2 x 15 ?Right shoulder adduction RTB 2 x 15 ?Right shoulder abduction RTB 2 x 10 ?Rt shoulder ER  RTB 2 x 15 ?Wall wash flexion 1 x 15 ?Wall wash abduction 1 x 10 ?Shoulder rolls backward ? ? ? ?10/05/21 ?UBE 3/3 FWD/ back ?Chin tuck x15  ?RTB rows 2 x 15 ?Shoulder extension RTB 2 x 15 ?RT shoulder IR  GTB 2 x 15 ?Rt shoulder ER  isometric walkouts RTB 10 x 10"  ? ? ?10/03/21 ?UBE 4 minutes backward level 1 ?Shoulder isometrics in standing 5 x 10 second holds each - flexion, abduction, extension, ER, IR with shoulder in neutral  ?Self STM with tennis ball to  Rhomboids - 9 minutes ?Row - red band 2x 15 ?Shoulder extension - red band 2x 15  ?Scap retraction with [facility] ER 2x 10  ? ?09/26/21: ?           Reviewed goals ?Seated:           ?           Cervical Isometric sidebend and rotation 5x5" ?           Cervical retraction 10x 5" ?           Scapular retraction 10x 5" ?           ER ?           WBack ?Sidelying: ?           ER ?Standing: ?           Pulley flex and abd ?           Arm slide 10x ?           Pec stretch 1x 30" painful ?  ?Eval: ?Cervical retraction x 10  ?Wand exercises: ?Flexion x 10 ?Abduction x 10 ?Supine ER/IR x 10  ?  ?  ?PATIENT EDUCATION:  ?Education details: HEP ?Person educated: Patient ?Education method: Explanation, Tactile cues, and Handouts ?Education comprehension: verbalized understanding and returned demonstration ?  ?  ?HOME EXERCISE PROGRAM: ?Cervical retraction x 10  ?Wand exercises: ?Flexion x 10 ?Abduction x 10 ?Supine ER/IR x 10 ?3/29: Wback and isometric ?  ?ASSESSMENT: ?  ?CLINICAL IMPRESSION: ?Continued session focusing on scapular stabilization and building more RTC strengthening secondary to patient pain patterning. Pt with improved ability to hold postural alignment and chin tuck during all activities with minimal cues needed.  Began shoulder stab with elbow extended completing small movements against wall.  Noted fatigue with 30 second intervals. Pt without any c/o pain during session, only minimal discomfort and fatigue. Patient will continue to benefit from skilled therapy services to reduce remaining deficits and improve functional ability.  ? ? ?  ?  ?OBJECTIVE IMPAIRMENTS decreased activity tolerance, decreased ROM, decreased strength, increased fascial restrictions, impaired flexibility, impaired UE  functional use, and pain.  ?  ?ACTIVITY LIMITATIONS cleaning, driving, yard work, and yard work.  ?  ?PERSONAL FACTORS Age, Past/current experiences, and 1 comorbidity: Past rotator cuff injury  are als

## 2021-10-16 ENCOUNTER — Encounter (HOSPITAL_COMMUNITY): Payer: Self-pay

## 2021-10-16 ENCOUNTER — Ambulatory Visit (HOSPITAL_COMMUNITY): Payer: Medicare Other

## 2021-10-16 DIAGNOSIS — G8929 Other chronic pain: Secondary | ICD-10-CM

## 2021-10-16 DIAGNOSIS — M542 Cervicalgia: Secondary | ICD-10-CM | POA: Diagnosis not present

## 2021-10-16 DIAGNOSIS — M25511 Pain in right shoulder: Secondary | ICD-10-CM | POA: Diagnosis not present

## 2021-10-16 NOTE — Therapy (Signed)
?OUTPATIENT PHYSICAL THERAPY TREATMENT NOTE ? ? ?Patient Name: Nicole Newman ?MRN: [id number] ?DOB:01/13/1951, 71 y.o., female ?Today's Date: 10/16/2021 ? ?PCP: Lavone Orn, MD ?REFERRING PROVIDER: Elsie Saas, MD  ? ?END OF SESSION:  ? PT End of Session - 10/16/21 0905   ? ? Visit Number 7   ? Number of Visits 12   ? Date for PT Re-Evaluation 11/05/21   ? Authorization Type BCBS   ? PT Start Time 0831   ? PT Stop Time 0910   ? PT Time Calculation (min) 39 min   ? Activity Tolerance Patient tolerated treatment well   ? Behavior During Therapy [facility] for tasks assessed/performed   ? ?  ?  ? ?  ? ? ? ?Past Medical History:  ?Diagnosis Date  ? Dysrhythmia   ? PVC'S 2017  ? Family history of adverse reaction to anesthesia   ? states mother had small MI during anesthesia  ? GERD (gastroesophageal reflux disease)   ? High cholesterol   ? Hypertension   ? states under control with med., has been on med. x 2.5 yr.  ? Lateral meniscal tear left knee 01/2016  ? left  ? Medial meniscus tear left knee 01/2016  ? left  ? PONV (postoperative nausea and vomiting)   ? PVC's (premature ventricular contractions)   ? ?Past Surgical History:  ?Procedure Laterality Date  ? APPENDECTOMY    ? BREAST BIOPSY Right 11/17/2012  ? [place]  ? BREAST REDUCTION SURGERY    ? CHONDROPLASTY Left 02/27/2016  ? Procedure: CHONDROPLASTY;  Surgeon: Elsie Saas, MD;  Location: [place];  Service: Orthopedics;  Laterality: Left;  ? DIAGNOSTIC LAPAROSCOPY    ? ESOPHAGEAL DILATION    ? ESOPHAGOGASTRODUODENOSCOPY (EGD) WITH PROPOFOL N/A 04/01/2019  ? Procedure: ESOPHAGOGASTRODUODENOSCOPY (EGD) WITH PROPOFOL;  Surgeon: Laurence Spates, MD;  [facility];  Service: Endoscopy;  Laterality: N/A;  ? 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 KNEE ARTHROSCOPY WITH LATERAL MENISECTOMY Left 02/27/2016  ? Procedure: KNEE ARTHROSCOPY WITH LATERAL and MEDIAL MENISCECTOMY, left;  Surgeon: Elsie Saas, MD;  Location: [place];   Service: Orthopedics;  Laterality: Left;  ? LAPAROSCOPIC BILATERAL SALPINGO OOPHERECTOMY Bilateral 07/06/2018  ? Procedure: LAPAROSCOPIC BILATERAL SALPINGO OOPHORECTOMY;  Surgeon: Ward, Honor Loh, MD;  Location: [facility];  Service: Gynecology;  Laterality: Bilateral;  ? LAPAROSCOPIC HYSTERECTOMY N/A 07/06/2018  ? Procedure: HYSTERECTOMY TOTAL LAPAROSCOPIC;  Surgeon: Ward, Honor Loh, MD;  Location: [facility];  Service: Gynecology;  Laterality: N/A;  ? REDUCTION MAMMAPLASTY Bilateral   ? SAVORY DILATION N/A 04/01/2019  ? Procedure: SAVORY DILATION;  Surgeon: Laurence Spates, MD;  [facility];  Service: Endoscopy;  Laterality: N/A;  ? ?Patient Active Problem List  ? Diagnosis Date Noted  ? Hypertension   ? GERD (gastroesophageal reflux disease)   ? Family history of adverse reaction to anesthesia   ? Medial meniscus tear 01/30/2016  ? Lateral meniscal tear left knee 01/30/2016  ? Palpitations 07/31/2015  ? Ventricular premature beats 07/31/2015  ? Tachycardia, unspecified 07/31/2015  ? ? ?REFERRING DIAG: PT eval/tx for cervical modalities w/dryneedling and rt shoulder RTC per Elsie Saas, MD   ? ?THERAPY DIAG:  ?Cervicalgia ? ?Chronic right shoulder pain ? ?PERTINENT HISTORY: OA, RT knee arthroscopic surgery, tachycardia  ? ?PRECAUTIONS: Shoulder do not lift to much wt.  ? ?SUBJECTIVE: Stated shoulder feels okay today, reports she has relaxed shoulder over weekend with pain scale 1-2/10 does c/o tingling numbness down  to elbow.   ? ?PAIN:  ?Are you having pain? Yes: NPRS scale: 1/10 ?Pain location: R shoulder joint line ?Pain description: sore, annoying ache ?Aggravating factors: sitting, driving, lifting ?Relieving factors: movement, heat ? Radicular symptoms down Rt elbow posterior ? ? ? ?OBJECTIVE:  ? Below measures taken at initial evaluation on 3/27 ?CERVICAL ROM:  ?  ?Active ROM A/PROM (deg) ?09/24/2021  ?Flexion WNL; reps increases pain   ?Extension WFL; reps slightly worsens sx   ?Right lateral flexion  Wnl ?   ?Left lateral flexion WNL  ?Right rotation WNL  ?Left rotation WNL   ? (Blank rows = not tested) ?  ?UE ROM: ?  ?Active ROM Right ?09/24/2021 Left ?09/24/2021 Right 10/03/21  ?Shoulder flexion 140 160 140 stiff/sore  ?Shoulder extension       ?Shoulder abduction 80   140 stiff/sore  ?Shoulder adduction       ?Shoulder extension       ?Shoulder internal rotation 60     ?Shoulder external rotation 40     ?  ?UE MMT: ?  ?MMT Right ?09/24/2021 Left ?09/24/2021  ?Shoulder flexion 3+/5    ?Shoulder extension      ?Shoulder abduction 3+/5    ?Shoulder adduction      ?Shoulder extension      ?Shoulder internal rotation 4/5    ?Shoulder external rotation 3+/5    ? ?CERVICAL SPECIAL TESTS:  ?Cervical strength:  extension:   4/5 ?                              Rt Sidebend: 4-/5 ?                              Lt sidebend: 4-/5  ?  ?  ?  ?TODAY'S TREATMENT:  ?10/16/21: ? UBE 2/2' level 1  FWD/ back ?Wall wash (stab from shoulder with elbow straight) up/down 30" X 2 ?Wall wash Rt/Lt 30"X2 ?UE flexion with cervical retraction 10x ?Right shoulder IR  RTB 2 x 15 ?Right shoulder adduction RTB 2 x 15 ?Rt shoulder ER  RTB 2 x 15 ?tricep extensions 2 x 15  cue for holding scap retraction ?Scapular elevate/depress; retract/protract ?Wall push up 10x ?Isometric Rt ER 5x 5" ? Abd 5x5" ? Rt IR 5x 5" ? ?  ? ?10/11/21: ?UBE 2/2' level 1  FWD/ back ?RTB  rows 2 x 15 ? tricep extensions 2 x 15  cue for holding scap retraction ?Shoulder extension RTB 2 x 15 ?Right shoulder IR  RTB 2 x 15 ?Right shoulder adduction RTB 2 x 15 ?Rt shoulder ER  RTB 2 x 15 ?Wall slide with washcloth flexion 1 x 15 ?Wall slide with washcloth abduction 1 x 15 ?Shoulder rolls backward 10X with chin tuck ?Wall wash (stab from shoulder with elbow straight) up/down 30" X 2 ?Wall wash Rt/Lt 30"X2 ?Thumbtacks bil 30"X2 ? ? ? ?10/09/21: ?UBE 3/3 FWD/ back ?Chin tuck and tall posture cued for all activities today ?RTB rows 2 x 15 ?RTB tricep extensions 2 x 15  cue for holding  scap retraction ?Shoulder extension RTB 2 x 15 ?Right shoulder IR  RTB 2 x 15 ?Right shoulder adduction RTB 2 x 15 ?Right shoulder abduction RTB 2 x 10 ?Rt shoulder ER  RTB 2 x 15 ?Wall wash flexion 1 x 15 ?Wall wash abduction 1 x 10 ?Shoulder rolls backward ? ? ? ?  10/05/21 ?UBE 3/3 FWD/ back ?Chin tuck x15  ?RTB rows 2 x 15 ?Shoulder extension RTB 2 x 15 ?RT shoulder IR  GTB 2 x 15 ?Rt shoulder ER isometric walkouts RTB 10 x 10"  ? ? ?10/03/21 ?UBE 4 minutes backward level 1 ?Shoulder isometrics in standing 5 x 10 second holds each - flexion, abduction, extension, ER, IR with shoulder in neutral  ?Self STM with tennis ball to Rhomboids - 9 minutes ?Row - red band 2x 15 ?Shoulder extension - red band 2x 15  ?Scap retraction with [facility] ER 2x 10  ? ?09/26/21: ?           Reviewed goals ?Seated:           ?           Cervical Isometric sidebend and rotation 5x5" ?           Cervical retraction 10x 5" ?           Scapular retraction 10x 5" ?           ER ?           WBack ?Sidelying: ?           ER ?Standing: ?           Pulley flex and abd ?           Arm slide 10x ?           Pec stretch 1x 30" painful ?  ?Eval: ?Cervical retraction x 10  ?Wand exercises: ?Flexion x 10 ?Abduction x 10 ?Supine ER/IR x 10  ?  ?  ?PATIENT EDUCATION:  ?Education details: HEP ?Person educated: Patient ?Education method: Explanation, Tactile cues, and Handouts ?Education comprehension: verbalized understanding and returned demonstration ?  ?  ?HOME EXERCISE PROGRAM: ?Cervical retraction x 10  ?Wand exercises: ?Flexion x 10 ?Abduction x 10 ?Supine ER/IR x 10 ?3/29: Wback and isometric ?  ?ASSESSMENT: ?  ?CLINICAL IMPRESSION: ?Continue sessoin focus with scapular stability and rotator cuff strengthening.  Pt reports decreased radicular symptoms with exercises paired with cervical retraction.  Reviewed current exercise program pt is completing at home and encouraged to continue postural strengthening and use of theraband or isometric resistance.  Pt  tolerated well to session with no reports of increased pain at EOS.  Noted fatigue with time based exercises and UE overhead.   ? ? ?  ?  ?OBJECTIVE IMPAIRMENTS decreased activity tolerance, decreased ROM,

## 2021-10-17 ENCOUNTER — Encounter (HOSPITAL_COMMUNITY): Payer: Self-pay | Admitting: Physical Therapy

## 2021-10-17 ENCOUNTER — Ambulatory Visit (HOSPITAL_COMMUNITY): Payer: Medicare Other | Admitting: Physical Therapy

## 2021-10-17 DIAGNOSIS — M542 Cervicalgia: Secondary | ICD-10-CM | POA: Diagnosis not present

## 2021-10-17 DIAGNOSIS — G8929 Other chronic pain: Secondary | ICD-10-CM

## 2021-10-17 DIAGNOSIS — M25511 Pain in right shoulder: Secondary | ICD-10-CM | POA: Diagnosis not present

## 2021-10-17 NOTE — Therapy (Signed)
?OUTPATIENT PHYSICAL THERAPY TREATMENT NOTE ? ? ?Patient Name: Nicole Newman ?MRN: [id number] ?DOB:1950/07/29, 71 y.o., female ?Today's Date: 10/17/2021 ? ?PCP: Lavone Orn, MD ?REFERRING PROVIDER: Elsie Saas, MD  ? ?END OF SESSION:  ? PT End of Session - 10/17/21 0747   ? ? Visit Number 8   ? Number of Visits 12   ? Date for PT Re-Evaluation 11/05/21   ? Authorization Type BCBS   ? PT Start Time [phone]   ? PT Stop Time 0825   ? PT Time Calculation (min) 38 min   ? Activity Tolerance Patient tolerated treatment well   ? Behavior During Therapy [facility] for tasks assessed/performed   ? ?  ?  ? ?  ? ? ? ?Past Medical History:  ?Diagnosis Date  ? Dysrhythmia   ? PVC'S 2017  ? Family history of adverse reaction to anesthesia   ? states mother had small MI during anesthesia  ? GERD (gastroesophageal reflux disease)   ? High cholesterol   ? Hypertension   ? states under control with med., has been on med. x 2.5 yr.  ? Lateral meniscal tear left knee 01/2016  ? left  ? Medial meniscus tear left knee 01/2016  ? left  ? PONV (postoperative nausea and vomiting)   ? PVC's (premature ventricular contractions)   ? ?Past Surgical History:  ?Procedure Laterality Date  ? APPENDECTOMY    ? BREAST BIOPSY Right 11/17/2012  ? [place]  ? BREAST REDUCTION SURGERY    ? CHONDROPLASTY Left 02/27/2016  ? Procedure: CHONDROPLASTY;  Surgeon: Elsie Saas, MD;  Location: [place];  Service: Orthopedics;  Laterality: Left;  ? DIAGNOSTIC LAPAROSCOPY    ? ESOPHAGEAL DILATION    ? ESOPHAGOGASTRODUODENOSCOPY (EGD) WITH PROPOFOL N/A 04/01/2019  ? Procedure: ESOPHAGOGASTRODUODENOSCOPY (EGD) WITH PROPOFOL;  Surgeon: Laurence Spates, MD;  Location: [facility];  Service: Endoscopy;  Laterality: N/A;  ? 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 KNEE ARTHROSCOPY WITH LATERAL MENISECTOMY Left 02/27/2016  ? Procedure: KNEE ARTHROSCOPY WITH LATERAL and MEDIAL MENISCECTOMY, left;  Surgeon: Elsie Saas, MD;  Location: [place];   Service: Orthopedics;  Laterality: Left;  ? LAPAROSCOPIC BILATERAL SALPINGO OOPHERECTOMY Bilateral 07/06/2018  ? Procedure: LAPAROSCOPIC BILATERAL SALPINGO OOPHORECTOMY;  Surgeon: Ward, Honor Loh, MD;  Location: [facility];  Service: Gynecology;  Laterality: Bilateral;  ? LAPAROSCOPIC HYSTERECTOMY N/A 07/06/2018  ? Procedure: HYSTERECTOMY TOTAL LAPAROSCOPIC;  Surgeon: Ward, Honor Loh, MD;  Location: [facility];  Service: Gynecology;  Laterality: N/A;  ? REDUCTION MAMMAPLASTY Bilateral   ? SAVORY DILATION N/A 04/01/2019  ? Procedure: SAVORY DILATION;  Surgeon: Laurence Spates, MD;  Location: [facility];  Service: Endoscopy;  Laterality: N/A;  ? ?Patient Active Problem List  ? Diagnosis Date Noted  ? Hypertension   ? GERD (gastroesophageal reflux disease)   ? Family history of adverse reaction to anesthesia   ? Medial meniscus tear 01/30/2016  ? Lateral meniscal tear left knee 01/30/2016  ? Palpitations 07/31/2015  ? Ventricular premature beats 07/31/2015  ? Tachycardia, unspecified 07/31/2015  ? ? ?REFERRING DIAG: PT eval/tx for cervical modalities w/dryneedling and rt shoulder RTC per Elsie Saas, MD   ? ?THERAPY DIAG:  ?Cervicalgia ? ?Chronic right shoulder pain ? ?PERTINENT HISTORY: OA, RT knee arthroscopic surgery, tachycardia  ? ?PRECAUTIONS: Shoulder do not lift to much wt.  ? ?SUBJECTIVE: Was sore yesterday in her elbow. When she feel last year she also hit her elbow. Exercises have been going alright.  Shoulder blade has been feeling better. ? ?PAIN:  ?Are you having pain? Yes: NPRS scale: 2/10 ?Pain location: R shoulder joint line ?Pain description: sore, annoying ache ?Aggravating factors: sitting, driving, lifting ?Relieving factors: movement, heat ? Radicular symptoms down Rt elbow posterior ? ? ? ?OBJECTIVE:  ? Below measures taken at initial evaluation on 3/27 ?CERVICAL ROM:  ?  ?Active ROM A/PROM (deg) ?09/24/2021  ?Flexion WNL; reps increases pain   ?Extension WFL; reps slightly worsens sx   ?Right lateral  flexion Wnl ?   ?Left lateral flexion WNL  ?Right rotation WNL  ?Left rotation WNL   ? (Blank rows = not tested) ?  ?UE ROM: ?  ?Active ROM Right ?09/24/2021 Left ?09/24/2021 Right 10/03/21  ?Shoulder flexion 140 160 140 stiff/sore  ?Shoulder extension       ?Shoulder abduction 80   140 stiff/sore  ?Shoulder adduction       ?Shoulder extension       ?Shoulder internal rotation 60     ?Shoulder external rotation 40     ?  ?UE MMT: ?  ?MMT Right ?09/24/2021 Left ?09/24/2021  ?Shoulder flexion 3+/5    ?Shoulder extension      ?Shoulder abduction 3+/5    ?Shoulder adduction      ?Shoulder extension      ?Shoulder internal rotation 4/5    ?Shoulder external rotation 3+/5    ? ?CERVICAL SPECIAL TESTS:  ?Cervical strength:  extension:   4/5 ?                              Rt Sidebend: 4-/5 ?                              Lt sidebend: 4-/5  ?  ?  ?  ?TODAY'S TREATMENT:  ?10/17/21 ?UBE 2/2' level 1  FWD/ back ?Right shoulder IR  RTB 2 x 15 ?Right shoulder adduction RTB 2 x 15 ?Rt shoulder ER  RTB 2 x 15 ?tricep extensions 2 x 15 RTB  cue for holding scap retraction bilateral ?Bicep curls 2x 15 RTB bilateral  ?Shoulder abduction with perpendicular resistance 2x 10 RTB RUE ?Shoulder flexion with perpendicular resistance 2x 10 RTB RUE ?Scapular retraction with GH ER 2x 15 RTB ?Horizontal abduction in supine RTB 2x 15 ? ? ?10/16/21: ? UBE 2/2' level 1  FWD/ back ?Wall wash (stab from shoulder with elbow straight) up/down 30" X 2 ?Wall wash Rt/Lt 30"X2 ?UE flexion with cervical retraction 10x ?Right shoulder IR  RTB 2 x 15 ?Right shoulder adduction RTB 2 x 15 ?Rt shoulder ER  RTB 2 x 15 ?tricep extensions 2 x 15  cue for holding scap retraction ?Scapular elevate/depress; retract/protract ?Wall push up 10x ?Isometric Rt ER 5x 5" ? Abd 5x5" ? Rt IR 5x 5" ? ?  ? ?10/11/21: ?UBE 2/2' level 1  FWD/ back ?RTB  rows 2 x 15 ? tricep extensions 2 x 15  cue for holding scap retraction ?Shoulder extension RTB 2 x 15 ?Right shoulder IR  RTB 2 x  15 ?Right shoulder adduction RTB 2 x 15 ?Rt shoulder ER  RTB 2 x 15 ?Wall slide with washcloth flexion 1 x 15 ?Wall slide with washcloth abduction 1 x 15 ?Shoulder rolls backward 10X with chin tuck ?Wall wash (stab from shoulder with elbow straight) up/down 30" X 2 ?Wall wash Rt/Lt 30"X2 ?Thumbtacks  bil 30"X2 ? ?  ?  ?PATIENT EDUCATION:  ?Education details: HEP ?Person educated: Patient ?Education method: Explanation, Tactile cues, and Handouts ?Education comprehension: verbalized understanding and returned demonstration ?  ?  ?HOME EXERCISE PROGRAM: ?Cervical retraction x 10  ?Wand exercises: ?Flexion x 10 ?Abduction x 10 ?Supine ER/IR x 10 ?3/29: Wback and isometric ?  ?ASSESSMENT: ?  ?CLINICAL IMPRESSION: ?Continued with RC and postural strengthening with bands which are tolerated well. Intermittent cueing for posture and scapular positioning. Greatest difficulty with shoulder abduction movements and cueing for performing in pain reduce range. Full shoulder PROM, restricted AROM overhead with greatest limited ABD. Unable to perform horizontal abd in standing due to pain, tolerates in supine with good mechanics. Patient will continue to benefit from physical therapy in order to improve function and reduce impairment. ? ? ?  ?  ?OBJECTIVE IMPAIRMENTS decreased activity tolerance, decreased ROM, decreased strength, increased fascial restrictions, impaired flexibility, impaired UE functional use, and pain.  ?  ?ACTIVITY LIMITATIONS cleaning, driving, yard work, and yard work.  ?  ?PERSONAL FACTORS Age, Past/current experiences, and 1 comorbidity: Past rotator cuff injury  are also affecting patient's functional outcome.  ?  ?  ?REHAB POTENTIAL: Good ?  ?CLINICAL DECISION MAKING: Stable/uncomplicated ?  ?EVALUATION COMPLEXITY: Moderate ?  ?  ?GOALS: ?Goals reviewed with patient? No ?  ?SHORT TERM GOALS: Target date: 10/17/2021 ?  ?Pt to have full  right shoulder AROM ?Baseline: see above  ?Goal status: Ongoing ?  ?2.   PT pain to be no greater than a 5/10 with full ROM of Rt shoulder ?Baseline: see above  ?Goal status: Ongoing ?  ?3.  Pt to demonstrate good cervical stability while raising both UE into flexion  ?Baseline: pt

## 2021-10-22 ENCOUNTER — Ambulatory Visit (HOSPITAL_COMMUNITY): Payer: Medicare Other | Admitting: Physical Therapy

## 2021-10-22 ENCOUNTER — Encounter (HOSPITAL_COMMUNITY): Payer: Self-pay | Admitting: Physical Therapy

## 2021-10-22 DIAGNOSIS — G8929 Other chronic pain: Secondary | ICD-10-CM

## 2021-10-22 DIAGNOSIS — M542 Cervicalgia: Secondary | ICD-10-CM

## 2021-10-22 DIAGNOSIS — M25511 Pain in right shoulder: Secondary | ICD-10-CM | POA: Diagnosis not present

## 2021-10-22 NOTE — Therapy (Signed)
?OUTPATIENT PHYSICAL THERAPY TREATMENT NOTE ? ? ?Patient Name: Nicole Newman ?MRN: [id number] ?DOB:1950/08/24, 71 y.o., female ?Today's Date: 10/22/2021 ? ?PCP: Lavone Orn, MD ?REFERRING PROVIDER: Elsie Saas, MD  ? ?END OF SESSION:  ? PT End of Session - 10/22/21 1008   ? ? Visit Number 9   ? Number of Visits 12   ? Date for PT Re-Evaluation 11/05/21   ? Authorization Type BCBS   ? PT Start Time 1006   ? PT Stop Time 1044   ? PT Time Calculation (min) 38 min   ? Activity Tolerance Patient tolerated treatment well   ? Behavior During Therapy [facility] for tasks assessed/performed   ? ?  ?  ? ?  ? ? ? ?Past Medical History:  ?Diagnosis Date  ? Dysrhythmia   ? PVC'S 2017  ? Family history of adverse reaction to anesthesia   ? states mother had small MI during anesthesia  ? GERD (gastroesophageal reflux disease)   ? High cholesterol   ? Hypertension   ? states under control with med., has been on med. x 2.5 yr.  ? Lateral meniscal tear left knee 01/2016  ? left  ? Medial meniscus tear left knee 01/2016  ? left  ? PONV (postoperative nausea and vomiting)   ? PVC's (premature ventricular contractions)   ? ?Past Surgical History:  ?Procedure Laterality Date  ? APPENDECTOMY    ? BREAST BIOPSY Right 11/17/2012  ? [place]  ? BREAST REDUCTION SURGERY    ? CHONDROPLASTY Left 02/27/2016  ? Procedure: CHONDROPLASTY;  Surgeon: Elsie Saas, MD;  Location: [place];  Service: Orthopedics;  Laterality: Left;  ? DIAGNOSTIC LAPAROSCOPY    ? ESOPHAGEAL DILATION    ? ESOPHAGOGASTRODUODENOSCOPY (EGD) WITH PROPOFOL N/A 04/01/2019  ? Procedure: ESOPHAGOGASTRODUODENOSCOPY (EGD) WITH PROPOFOL;  Surgeon: Laurence Spates, MD;  [facility];  Service: Endoscopy;  Laterality: N/A;  ? 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 KNEE ARTHROSCOPY WITH LATERAL MENISECTOMY Left 02/27/2016  ? Procedure: KNEE ARTHROSCOPY WITH LATERAL and MEDIAL MENISCECTOMY, left;  Surgeon: Elsie Saas, MD;  Location: [place];   Service: Orthopedics;  Laterality: Left;  ? LAPAROSCOPIC BILATERAL SALPINGO OOPHERECTOMY Bilateral 07/06/2018  ? Procedure: LAPAROSCOPIC BILATERAL SALPINGO OOPHORECTOMY;  Surgeon: Ward, Honor Loh, MD;  Location: [facility];  Service: Gynecology;  Laterality: Bilateral;  ? LAPAROSCOPIC HYSTERECTOMY N/A 07/06/2018  ? Procedure: HYSTERECTOMY TOTAL LAPAROSCOPIC;  Surgeon: Ward, Honor Loh, MD;  Location: [facility];  Service: Gynecology;  Laterality: N/A;  ? REDUCTION MAMMAPLASTY Bilateral   ? SAVORY DILATION N/A 04/01/2019  ? Procedure: SAVORY DILATION;  Surgeon: Laurence Spates, MD;  [facility];  Service: Endoscopy;  Laterality: N/A;  ? ?Patient Active Problem List  ? Diagnosis Date Noted  ? Hypertension   ? GERD (gastroesophageal reflux disease)   ? Family history of adverse reaction to anesthesia   ? Medial meniscus tear 01/30/2016  ? Lateral meniscal tear left knee 01/30/2016  ? Palpitations 07/31/2015  ? Ventricular premature beats 07/31/2015  ? Tachycardia, unspecified 07/31/2015  ? ? ?REFERRING DIAG: PT eval/tx for cervical modalities w/dryneedling and rt shoulder RTC per Elsie Saas, MD   ? ?THERAPY DIAG:  ?Cervicalgia ? ?Chronic right shoulder pain ? ?PERTINENT HISTORY: OA, RT knee arthroscopic surgery, tachycardia  ? ?PRECAUTIONS: Shoulder do not lift to much wt.  ? ?SUBJECTIVE:  A little sore but not as bad as last week. Painfree sometimes with sitting.  ? ?PAIN:  ?Are you  having pain? Yes: NPRS scale: 2/10 ?Pain location: R shoulder joint line ?Pain description: sore, annoying ache ?Aggravating factors: sitting, driving, lifting ?Relieving factors: movement, heat ? Radicular symptoms down Rt elbow posterior ? ? ? ?OBJECTIVE:  ? Below measures taken at initial evaluation on 3/27 ?CERVICAL ROM:  ?  ?Active ROM A/PROM (deg) ?09/24/2021  ?Flexion WNL; reps increases pain   ?Extension WFL; reps slightly worsens sx   ?Right lateral flexion Wnl ?   ?Left lateral flexion WNL  ?Right rotation WNL  ?Left rotation  WNL   ? (Blank rows = not tested) ?  ?UE ROM: ?  ?Active ROM Right ?09/24/2021 Left ?09/24/2021 Right 10/03/21 Right  ?10/22/21  ?Shoulder flexion 140 160 140 stiff/sore 142 ?stiff/sore  ?Shoulder extension        ?Shoulder abduction 80   140 stiff/sore 145 ?stiff/sore  ?Shoulder adduction        ?Shoulder extension        ?Shoulder internal rotation 60      ?Shoulder external rotation 40      ?  ?UE MMT: ?  ?MMT Right ?09/24/2021 Left ?09/24/2021  ?Shoulder flexion 3+/5    ?Shoulder extension      ?Shoulder abduction 3+/5    ?Shoulder adduction      ?Shoulder extension      ?Shoulder internal rotation 4/5    ?Shoulder external rotation 3+/5    ? ?CERVICAL SPECIAL TESTS:  ?Cervical strength:  extension:   4/5 ?                              Rt Sidebend: 4-/5 ?                              Lt sidebend: 4-/5  ?  ?  ?  ?TODAY'S TREATMENT:  ?10/22/21 ?UBE 2/2' level 1  FWD/ back ?Scapular retraction with GH ER 2x 15 RTB ?Horizontal abduction in supine RTB 2x 15 ?Supine shoulder PNF D2 pattern 2x 15 RTB ?tricep extensions 2 x 15 RTB  cue for holding scap retraction bilateral ?Bicep curls 2x 15 RTB bilateral  ? ? ?10/17/21 ?UBE 2/2' level 1  FWD/ back ?Right shoulder IR  RTB 2 x 15 ?Right shoulder adduction RTB 2 x 15 ?Rt shoulder ER  RTB 2 x 15 ?tricep extensions 2 x 15 RTB  cue for holding scap retraction bilateral ?Bicep curls 2x 15 RTB bilateral  ?Shoulder abduction with perpendicular resistance 2x 10 RTB RUE ?Shoulder flexion with perpendicular resistance 2x 10 RTB RUE ?Scapular retraction with GH ER 2x 15 RTB ?Horizontal abduction in supine RTB 2x 15 ? ? ?10/16/21: ? UBE 2/2' level 1  FWD/ back ?Wall wash (stab from shoulder with elbow straight) up/down 30" X 2 ?Wall wash Rt/Lt 30"X2 ?UE flexion with cervical retraction 10x ?Right shoulder IR  RTB 2 x 15 ?Right shoulder adduction RTB 2 x 15 ?Rt shoulder ER  RTB 2 x 15 ?tricep extensions 2 x 15  cue for holding scap retraction ?Scapular elevate/depress; retract/protract ?Wall  push up 10x ?Isometric Rt ER 5x 5" ? Abd 5x5" ? Rt IR 5x 5" ? ?  ? ?10/11/21: ?UBE 2/2' level 1  FWD/ back ?RTB  rows 2 x 15 ? tricep extensions 2 x 15  cue for holding scap retraction ?Shoulder extension RTB 2 x 15 ?Right shoulder IR  RTB 2 x 15 ?Right  shoulder adduction RTB 2 x 15 ?Rt shoulder ER  RTB 2 x 15 ?Wall slide with washcloth flexion 1 x 15 ?Wall slide with washcloth abduction 1 x 15 ?Shoulder rolls backward 10X with chin tuck ?Wall wash (stab from shoulder with elbow straight) up/down 30" X 2 ?Wall wash Rt/Lt 30"X2 ?Thumbtacks bil 30"X2 ? ?  ?  ?PATIENT EDUCATION:  ?Education details: HEP ?Person educated: Patient ?Education method: Explanation, Tactile cues, and Handouts ?Education comprehension: verbalized understanding and returned demonstration ?  ?  ?HOME EXERCISE PROGRAM: ?Cervical retraction x 10  ?Wand exercises: ?Flexion x 10 ?Abduction x 10 ?Supine ER/IR x 10 ?3/29: Wback and isometric ?Supine PNF D2 ?  ?ASSESSMENT: ? CLINICAL IMPRESSION: ?Continued with RC strengthening which is tolerated well. Educated on shoulder anatomy and biomechanics. She is demonstrating improving ROM with decreasing symptoms, greatest symptoms with end range positioning and eccentric lowering. Initiated PNF D2 in supine for additional overhead strengthening and stability. Will reassess next session and decrease frequency with POC. Patient will continue to benefit from physical therapy in order to improve function and reduce impairment. ? ? ?  ?  ?OBJECTIVE IMPAIRMENTS decreased activity tolerance, decreased ROM, decreased strength, increased fascial restrictions, impaired flexibility, impaired UE functional use, and pain.  ?  ?ACTIVITY LIMITATIONS cleaning, driving, yard work, and yard work.  ?  ?PERSONAL FACTORS Age, Past/current experiences, and 1 comorbidity: Past rotator cuff injury  are also affecting patient's functional outcome.  ?  ?  ?REHAB POTENTIAL: Good ?  ?CLINICAL DECISION MAKING: Stable/uncomplicated ?   ?EVALUATION COMPLEXITY: Moderate ?  ?  ?GOALS: ?Goals reviewed with patient? No ?  ?SHORT TERM GOALS: Target date: 10/17/2021 ?  ?Pt to have full  right shoulder AROM ?Baseline: see above  ?Goal status: O

## 2021-10-22 NOTE — Patient Instructions (Signed)
Access Code: [email or web address] ?URL: [email or web address] ?Date: 10/22/2021 ?Prepared by: Mitzi Hansen Tanush Drees ? ?Exercises ?- Standing Shoulder Single Arm PNF D2 Flexion with Resistance  - 1 x daily - 7 x weekly - 2 sets - 15 reps ?

## 2021-10-24 ENCOUNTER — Encounter (HOSPITAL_COMMUNITY): Payer: Medicare Other | Admitting: Physical Therapy

## 2021-10-26 ENCOUNTER — Encounter (HOSPITAL_COMMUNITY): Payer: Medicare Other | Admitting: Physical Therapy

## 2021-10-30 ENCOUNTER — Encounter (HOSPITAL_COMMUNITY): Payer: Medicare Other | Admitting: Physical Therapy

## 2021-11-01 ENCOUNTER — Ambulatory Visit (HOSPITAL_COMMUNITY): Payer: Medicare Other | Attending: Orthopedic Surgery | Admitting: Physical Therapy

## 2021-11-01 ENCOUNTER — Encounter (HOSPITAL_COMMUNITY): Payer: Self-pay | Admitting: Physical Therapy

## 2021-11-01 DIAGNOSIS — M25511 Pain in right shoulder: Secondary | ICD-10-CM | POA: Insufficient documentation

## 2021-11-01 DIAGNOSIS — M542 Cervicalgia: Secondary | ICD-10-CM | POA: Diagnosis not present

## 2021-11-01 DIAGNOSIS — G8929 Other chronic pain: Secondary | ICD-10-CM | POA: Diagnosis not present

## 2021-11-01 NOTE — Therapy (Signed)
?OUTPATIENT PHYSICAL THERAPY TREATMENT NOTE ? ? ?Patient Name: Nicole Newman ?MRN: [id number] ?DOB:30-May-1951, 71 y.o., female ?Today's Date: 11/01/2021 ? ?PCP: Lavone Orn, MD ?REFERRING PROVIDER: Elsie Saas, MD  ? ?Progress Note  ? ?Reporting Period 09/24/21 to 11/01/21 ? ? See note below for Objective Data and Assessment of Progress/Goals  ? ?END OF SESSION:  ? PT End of Session - 11/01/21 0916   ? ? Visit Number 10   ? Number of Visits 16   ? Date for PT Re-Evaluation 12/27/21   ? Authorization Type BCBS   ? PT Start Time [phone]   ? PT Stop Time [phone]   ? PT Time Calculation (min) 42 min   ? Activity Tolerance Patient tolerated treatment well   ? Behavior During Therapy [facility] for tasks assessed/performed   ? ?  ?  ? ?  ? ? ? ?Past Medical History:  ?Diagnosis Date  ? Dysrhythmia   ? PVC'S 2017  ? Family history of adverse reaction to anesthesia   ? states mother had small MI during anesthesia  ? GERD (gastroesophageal reflux disease)   ? High cholesterol   ? Hypertension   ? states under control with med., has been on med. x 2.5 yr.  ? Lateral meniscal tear left knee 01/2016  ? left  ? Medial meniscus tear left knee 01/2016  ? left  ? PONV (postoperative nausea and vomiting)   ? PVC's (premature ventricular contractions)   ? ?Past Surgical History:  ?Procedure Laterality Date  ? APPENDECTOMY    ? BREAST BIOPSY Right 11/17/2012  ? [place]  ? BREAST REDUCTION SURGERY    ? CHONDROPLASTY Left 02/27/2016  ? Procedure: CHONDROPLASTY;  Surgeon: Elsie Saas, MD;  Location: [place];  Service: Orthopedics;  Laterality: Left;  ? DIAGNOSTIC LAPAROSCOPY    ? ESOPHAGEAL DILATION    ? ESOPHAGOGASTRODUODENOSCOPY (EGD) WITH PROPOFOL N/A 04/01/2019  ? Procedure: ESOPHAGOGASTRODUODENOSCOPY (EGD) WITH PROPOFOL;  Surgeon: Laurence Spates, MD;  Location: [facility];  Service: Endoscopy;  Laterality: N/A;  ? 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 KNEE ARTHROSCOPY WITH LATERAL MENISECTOMY Left 02/27/2016  ? Procedure:  KNEE ARTHROSCOPY WITH LATERAL and MEDIAL MENISCECTOMY, left;  Surgeon: Elsie Saas, MD;  Location: [place];  Service: Orthopedics;  Laterality: Left;  ? LAPAROSCOPIC BILATERAL SALPINGO OOPHERECTOMY Bilateral 07/06/2018  ? Procedure: LAPAROSCOPIC BILATERAL SALPINGO OOPHORECTOMY;  Surgeon: Ward, Honor Loh, MD;  Location: [facility];  Service: Gynecology;  Laterality: Bilateral;  ? LAPAROSCOPIC HYSTERECTOMY N/A 07/06/2018  ? Procedure: HYSTERECTOMY TOTAL LAPAROSCOPIC;  Surgeon: Ward, Honor Loh, MD;  Location: [facility];  Service: Gynecology;  Laterality: N/A;  ? REDUCTION MAMMAPLASTY Bilateral   ? SAVORY DILATION N/A 04/01/2019  ? Procedure: SAVORY DILATION;  Surgeon: Laurence Spates, MD;  Location: [facility];  Service: Endoscopy;  Laterality: N/A;  ? ?Patient Active Problem List  ? Diagnosis Date Noted  ? Hypertension   ? GERD (gastroesophageal reflux disease)   ? Family history of adverse reaction to anesthesia   ? Medial meniscus tear 01/30/2016  ? Lateral meniscal tear left knee 01/30/2016  ? Palpitations 07/31/2015  ? Ventricular premature beats 07/31/2015  ? Tachycardia, unspecified 07/31/2015  ? ? ?REFERRING DIAG: PT eval/tx for cervical modalities w/dryneedling and rt shoulder RTC per Elsie Saas, MD   ? ?THERAPY DIAG:  ?Cervicalgia ? ?Chronic right shoulder pain ? ?PERTINENT HISTORY: OA, RT knee arthroscopic surgery, tachycardia  ? ?PRECAUTIONS: Shoulder do not lift to much wt.  ? ?  SUBJECTIVE:  States she worked in the yard at [employer]. Sore with yard work in shoulder. Hard to lift arm with sore even with lifting coffee cup. Exercises seem to loosen it up. Pain is a lot better and not having sharp stabbing pain. Patient states sometimes pain into R forearm, not sure if from neck or shoulder. Patient states 85% improvement since beginning therapy but it depends on the day. She still has limitations and discomfort. More stable than it used to be but she still feels it. Trouble with ADL still.   ? ?PAIN:  ?Are you having pain? Yes: NPRS scale: 2/10 ?Pain location: R shoulder joint line ?Pain description: sore, annoying ache ?Aggravating factors: sitting, driving, lifting ?Relieving factors: movement, heat ? Radicular symptoms down Rt elbow posterior ? ? ? ?OBJECTIVE:  ? Below measures taken at initial evaluation on 3/27 unless otherwise dated ?CERVICAL ROM:  ?  ?Active ROM A/PROM (deg) ?09/24/2021  ?Flexion WNL; reps increases pain   ?Extension WFL; reps slightly worsens sx   ?Right lateral flexion Wnl ?   ?Left lateral flexion WNL  ?Right rotation WNL  ?Left rotation WNL   ? (Blank rows = not tested) ?  ?UE ROM: ?  ?Active ROM Right ?09/24/2021 Left ?09/24/2021 Right 10/03/21 Right  ?10/22/21 Right 11/01/21  ?Shoulder flexion 140 160 140 stiff/sore 142 ?stiff/sore 142 ?stiff/sore - eccentric more painful  ?Shoulder extension         ?Shoulder abduction 80   140 stiff/sore 145 ?stiff/sore 144 ?stiff/sore -eccentric more painful  ?Shoulder adduction         ?Shoulder extension         ?Shoulder internal rotation 60       ?Shoulder external rotation 40       ?  ?UE MMT: ?  ?MMT Right ?09/24/2021 Left ?09/24/2021 Right 11/01/21  ?Shoulder flexion 3+/5   3+/5  ?Shoulder extension       ?Shoulder abduction 3+/5   3+/5  ?Shoulder adduction       ?Shoulder extension       ?Shoulder internal rotation 4/5   4+/5  ?Shoulder external rotation 3+/5   4-/5  ? ?CERVICAL SPECIAL TESTS:  ?Cervical strength:  extension:   4/5 ?                              Rt Sidebend: 4-/5 ?                              Lt sidebend: 4-/5  ?  ?  ?  ?TODAY'S TREATMENT:  ?11/01/21 ?Reassessment and POC  discussion ?Scapular retraction with GH ER 2x 15 RTB ?Horizontal abduction in standing RTB 2x 15 ?Supine shoulder PNF D2 pattern 2x 15 RTB ?Wall wash ABCs 2x ?tricep extensions 2 x 15 RTB  cue for holding scap retraction bilateral ?Bicep curls 2x 15 RTB bilateral   ? ?10/22/21 ?UBE 2/2' level 1  FWD/ back ?Scapular retraction with GH ER 2x 15  RTB ?Horizontal abduction in supine RTB 2x 15 ?Supine shoulder PNF D2 pattern 2x 15 RTB ?tricep extensions 2 x 15 RTB  cue for holding scap retraction bilateral ?Bicep curls 2x 15 RTB bilateral  ? ? ?10/17/21 ?UBE 2/2' level 1  FWD/ back ?Right shoulder IR  RTB 2 x 15 ?Right shoulder adduction RTB 2 x 15 ?Rt shoulder ER  RTB 2 x 15 ?  tricep extensions 2 x 15 RTB  cue for holding scap retraction bilateral ?Bicep curls 2x 15 RTB bilateral  ?Shoulder abduction with perpendicular resistance 2x 10 RTB RUE ?Shoulder flexion with perpendicular resistance 2x 10 RTB RUE ?Scapular retraction with GH ER 2x 15 RTB ?Horizontal abduction in supine RTB 2x 15 ? ? ?10/16/21: ? UBE 2/2' level 1  FWD/ back ?Wall wash (stab from shoulder with elbow straight) up/down 30" X 2 ?Wall wash Rt/Lt 30"X2 ?UE flexion with cervical retraction 10x ?Right shoulder IR  RTB 2 x 15 ?Right shoulder adduction RTB 2 x 15 ?Rt shoulder ER  RTB 2 x 15 ?tricep extensions 2 x 15  cue for holding scap retraction ?Scapular elevate/depress; retract/protract ?Wall push up 10x ?Isometric Rt ER 5x 5" ? Abd 5x5" ? Rt IR 5x 5" ? ?  ? ?10/11/21: ?UBE 2/2' level 1  FWD/ back ?RTB  rows 2 x 15 ? tricep extensions 2 x 15  cue for holding scap retraction ?Shoulder extension RTB 2 x 15 ?Right shoulder IR  RTB 2 x 15 ?Right shoulder adduction RTB 2 x 15 ?Rt shoulder ER  RTB 2 x 15 ?Wall slide with washcloth flexion 1 x 15 ?Wall slide with washcloth abduction 1 x 15 ?Shoulder rolls backward 10X with chin tuck ?Wall wash (stab from shoulder with elbow straight) up/down 30" X 2 ?Wall wash Rt/Lt 30"X2 ?Thumbtacks bil 30"X2 ? ?  ?  ?PATIENT EDUCATION:  ?Education details: HEP, reassessment findings, POC ?Person educated: Patient ?Education method: Explanation, Tactile cues, and Handouts ?Education comprehension: verbalized understanding and returned demonstration ?  ?  ?HOME EXERCISE PROGRAM: ?Cervical retraction x 10  ?Wand exercises: ?Flexion x 10 ?Abduction x 10 ?Supine ER/IR x  10 ?3/29: Wback and isometric ?Supine PNF D2 ?Access Code: [email or web address] ?Exercises 11/01/21 ?- Shoulder External Rotation and Scapular Retraction with Resistance  - 1 x daily - 7 x weekly - 2 sets - 15 reps ?- Standing Shou

## 2021-11-27 ENCOUNTER — Encounter (HOSPITAL_COMMUNITY): Payer: Medicare Other | Admitting: Physical Therapy

## 2021-11-27 ENCOUNTER — Encounter (HOSPITAL_COMMUNITY): Payer: Self-pay | Admitting: Physical Therapy

## 2021-11-27 NOTE — Therapy (Signed)
[place], [place], [address] Phone: [phone]   Fax:  [phone]  Patient Details  Name: Nicole Newman MRN: [id number] Date of Birth: January 07, 1951 Referring Provider:  No ref. provider found  Encounter Date: 11/27/2021  PHYSICAL THERAPY DISCHARGE SUMMARY  Visits from Start of Care: 10  Current functional level related to goals / functional outcomes: Per front desk staff, patient feeling good without issue and is completing HEP.    Remaining deficits: Unknown as patient has not returned    Education / Equipment: HEP   Patient agrees to discharge. Patient goals were partially met. Patient is being discharged due to the patient's request.  10:30 AM, 11/27/21 Mearl Latin PT, DPT Physical Therapist at [place], [place], [address] Phone: [phone]   Fax:  [phone]

## 2021-12-13 ENCOUNTER — Encounter (HOSPITAL_COMMUNITY): Payer: Medicare Other | Admitting: Physical Therapy

## 2021-12-27 ENCOUNTER — Encounter (HOSPITAL_COMMUNITY): Payer: Medicare Other | Admitting: Physical Therapy

## 2022-01-29 DIAGNOSIS — M25561 Pain in right knee: Secondary | ICD-10-CM | POA: Diagnosis not present

## 2022-01-29 DIAGNOSIS — M25511 Pain in right shoulder: Secondary | ICD-10-CM | POA: Diagnosis not present

## 2022-02-05 IMAGING — MG MM DIGITAL SCREENING BILAT W/ TOMO AND CAD
8 series · 8 of 24 positions shown · non-contrast
Comparison: Previous exam(s).

CLINICAL DATA: Screening.

EXAM:
DIGITAL SCREENING BILATERAL MAMMOGRAM WITH TOMOSYNTHESIS AND CAD
TECHNIQUE: Bilateral screening digital craniocaudal and mediolateral oblique
mammograms were obtained. Bilateral screening digital breast
tomosynthesis was performed. The images were evaluated with
computer-aided detection.

[R MLO synth-2D]
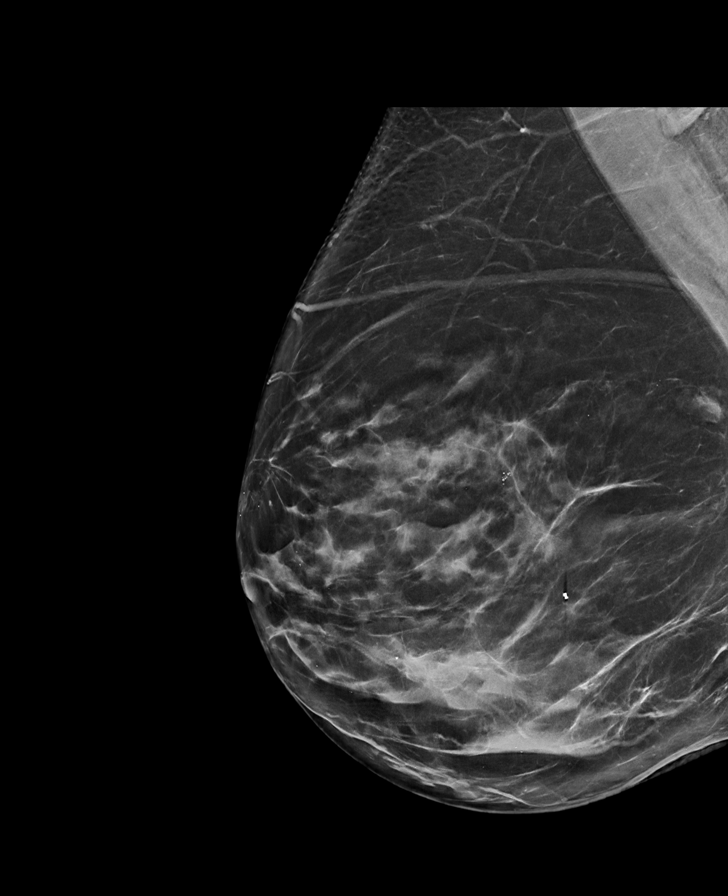

[L CC synth-2D]
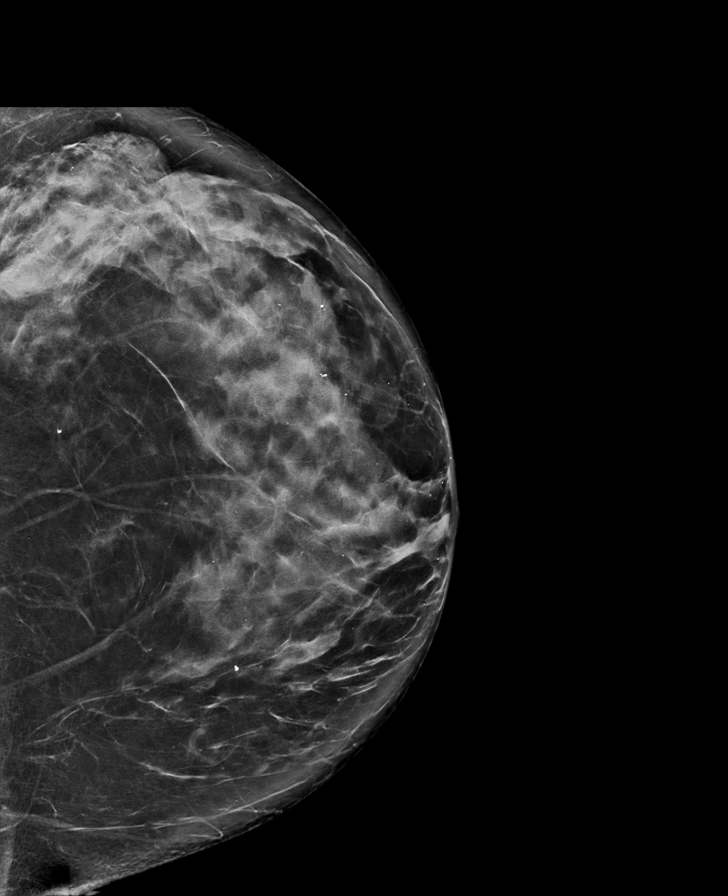

[L MLO synth-2D]
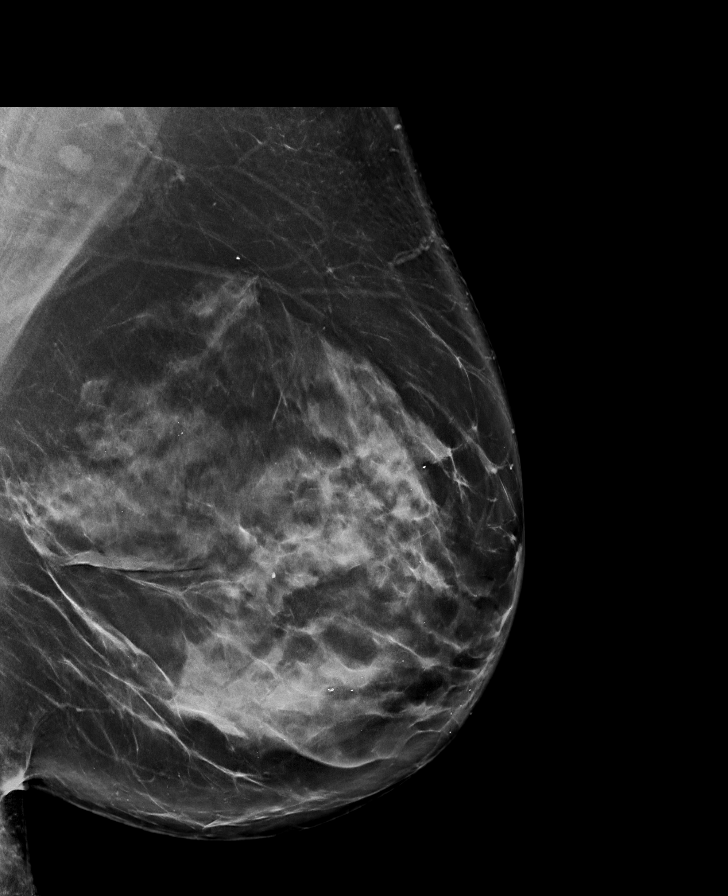

[R CC synth-2D]
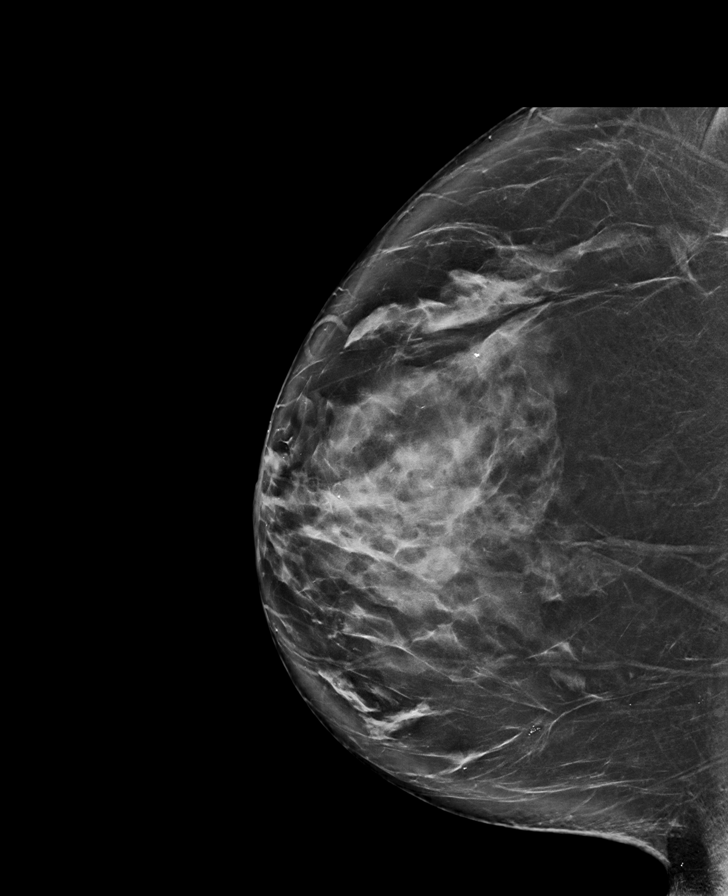

[L MLO tomo · tomo slice 55/108.0]
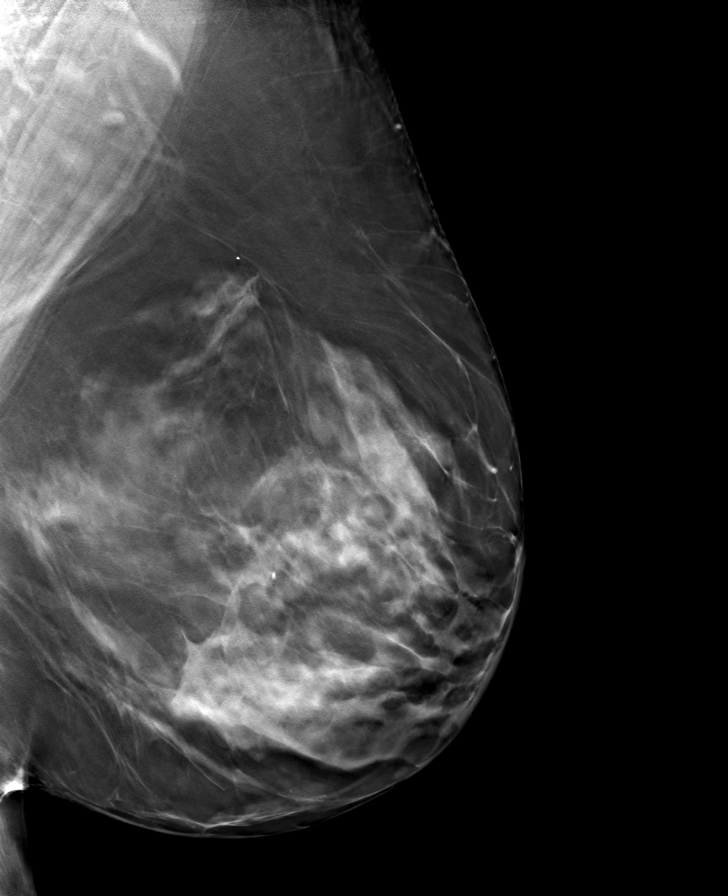

[R CC tomo · tomo slice 47/94.0]
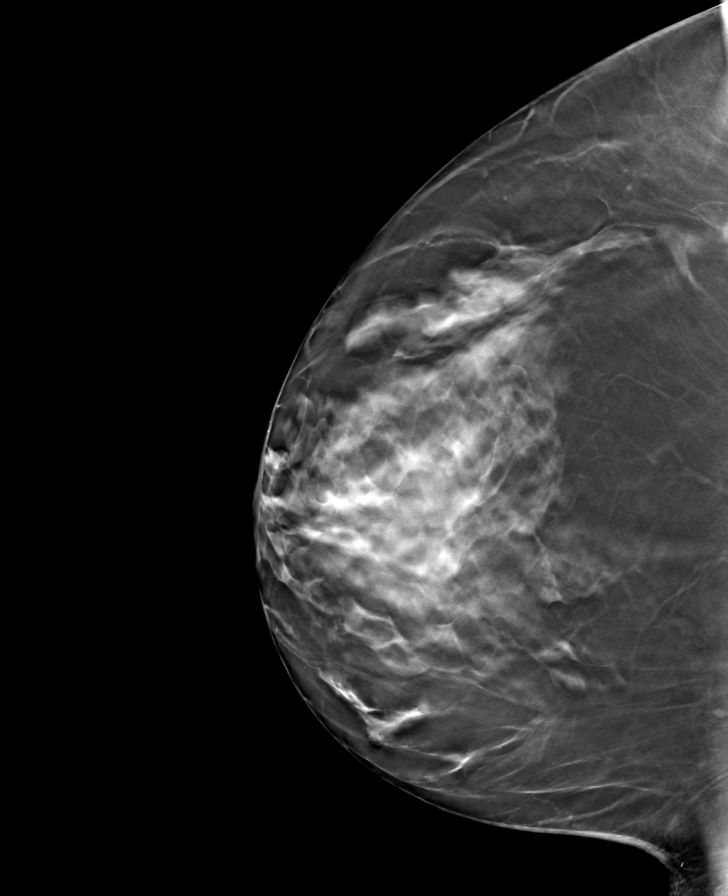

[L CC tomo · tomo slice 48/95.0]
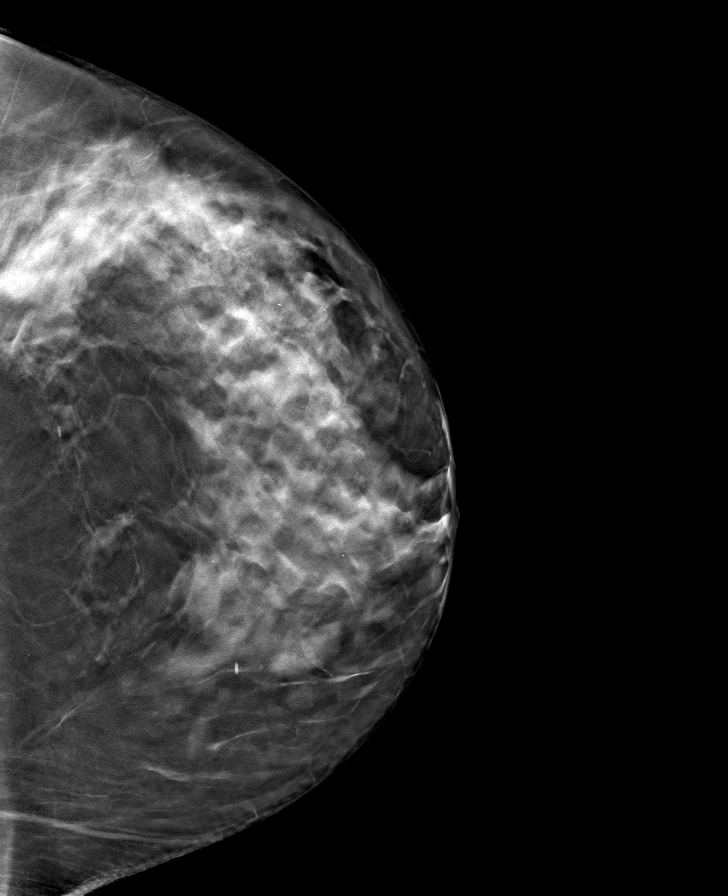

[R MLO tomo · tomo slice 48/95.0]
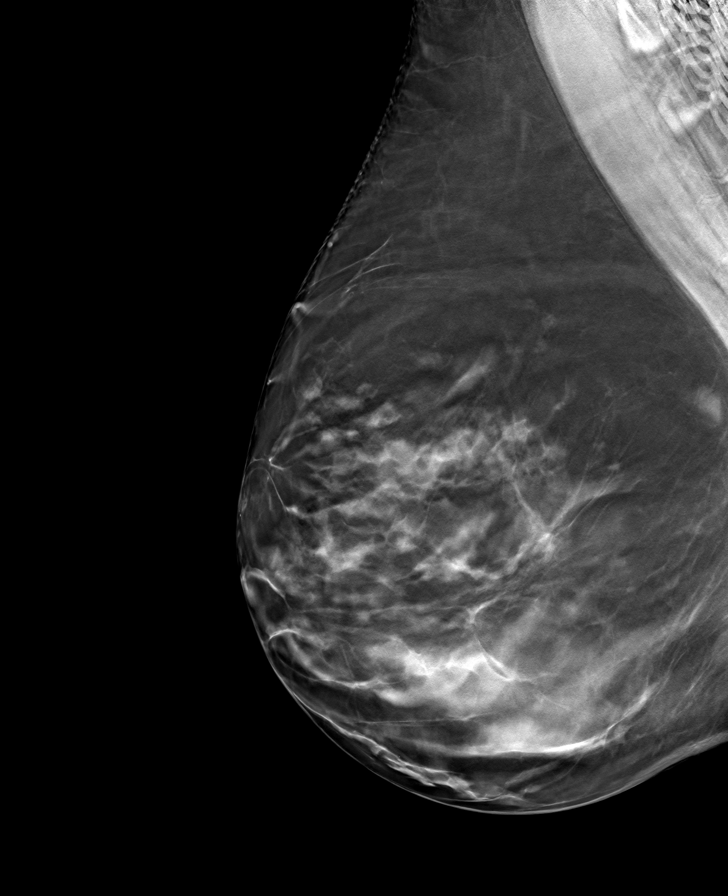

[8 of 24 positions shown; findings below may reference images not displayed]

ACR Breast Density Category c: The breast tissue is heterogeneously
dense, which may obscure small masses.
FINDINGS: There are no findings suspicious for malignancy. The images were
evaluated with computer-aided detection.
IMPRESSION: No mammographic evidence of malignancy. A result letter of this
screening mammogram will be mailed directly to the patient.

RECOMMENDATION:
Screening mammogram in one year. (Code:T4-5-GWO)

BI-RADS CATEGORY  1: Negative.

## 2022-03-08 ENCOUNTER — Other Ambulatory Visit: Payer: Self-pay | Admitting: Internal Medicine

## 2022-03-08 DIAGNOSIS — K219 Gastro-esophageal reflux disease without esophagitis: Secondary | ICD-10-CM | POA: Diagnosis not present

## 2022-03-08 DIAGNOSIS — G4733 Obstructive sleep apnea (adult) (pediatric): Secondary | ICD-10-CM | POA: Diagnosis not present

## 2022-03-08 DIAGNOSIS — E7800 Pure hypercholesterolemia, unspecified: Secondary | ICD-10-CM | POA: Diagnosis not present

## 2022-03-08 DIAGNOSIS — R1011 Right upper quadrant pain: Secondary | ICD-10-CM

## 2022-03-08 DIAGNOSIS — I10 Essential (primary) hypertension: Secondary | ICD-10-CM | POA: Diagnosis not present

## 2022-03-08 DIAGNOSIS — Z1331 Encounter for screening for depression: Secondary | ICD-10-CM | POA: Diagnosis not present

## 2022-03-08 DIAGNOSIS — Z0000 Encounter for general adult medical examination without abnormal findings: Secondary | ICD-10-CM | POA: Diagnosis not present

## 2022-03-11 ENCOUNTER — Ambulatory Visit
Admission: RE | Admit: 2022-03-11 | Discharge: 2022-03-11 | Disposition: A | Discharge status: Home / Self Care | Payer: Medicare Other | Source: Ambulatory Visit | Attending: Internal Medicine | Admitting: Internal Medicine

## 2022-03-11 DIAGNOSIS — R1011 Right upper quadrant pain: Secondary | ICD-10-CM | POA: Diagnosis not present

## 2022-03-11 DIAGNOSIS — K760 Fatty (change of) liver, not elsewhere classified: Secondary | ICD-10-CM | POA: Diagnosis not present

## 2022-04-02 DIAGNOSIS — K219 Gastro-esophageal reflux disease without esophagitis: Secondary | ICD-10-CM | POA: Diagnosis not present

## 2022-04-02 DIAGNOSIS — R1011 Right upper quadrant pain: Secondary | ICD-10-CM | POA: Diagnosis not present

## 2022-04-12 ENCOUNTER — Other Ambulatory Visit (HOSPITAL_COMMUNITY): Payer: Self-pay | Admitting: Internal Medicine

## 2022-04-12 ENCOUNTER — Other Ambulatory Visit: Payer: Self-pay | Admitting: Internal Medicine

## 2022-04-12 DIAGNOSIS — E7800 Pure hypercholesterolemia, unspecified: Secondary | ICD-10-CM

## 2022-04-12 DIAGNOSIS — Z5181 Encounter for therapeutic drug level monitoring: Secondary | ICD-10-CM

## 2022-04-12 DIAGNOSIS — R1011 Right upper quadrant pain: Secondary | ICD-10-CM

## 2022-04-18 ENCOUNTER — Encounter (HOSPITAL_COMMUNITY)
Admission: RE | Admit: 2022-04-18 | Discharge: 2022-04-18 | Disposition: A | Discharge status: Home / Self Care | Payer: Medicare Other | Source: Ambulatory Visit | Attending: Internal Medicine | Admitting: Internal Medicine

## 2022-04-18 DIAGNOSIS — E7800 Pure hypercholesterolemia, unspecified: Secondary | ICD-10-CM | POA: Diagnosis not present

## 2022-04-18 DIAGNOSIS — R1011 Right upper quadrant pain: Secondary | ICD-10-CM | POA: Diagnosis not present

## 2022-04-18 DIAGNOSIS — Z5181 Encounter for therapeutic drug level monitoring: Secondary | ICD-10-CM | POA: Diagnosis not present

## 2022-04-18 DIAGNOSIS — R109 Unspecified abdominal pain: Secondary | ICD-10-CM | POA: Diagnosis not present

## 2022-04-18 MED ORDER — TECHNETIUM TC 99M MEBROFENIN IV KIT
5.0000 | PACK | Freq: Once | INTRAVENOUS | Status: AC | PRN
  Administered 2022-04-18: 5.2 via INTRAVENOUS

## 2022-04-19 DIAGNOSIS — H524 Presbyopia: Secondary | ICD-10-CM | POA: Diagnosis not present

## 2022-04-23 DIAGNOSIS — D135 Benign neoplasm of extrahepatic bile ducts: Secondary | ICD-10-CM | POA: Diagnosis not present

## 2022-04-29 ENCOUNTER — Other Ambulatory Visit: Payer: Self-pay

## 2022-04-29 ENCOUNTER — Encounter (HOSPITAL_COMMUNITY): Payer: Self-pay | Admitting: Surgery

## 2022-04-29 ENCOUNTER — Ambulatory Visit: Payer: Self-pay | Admitting: Surgery

## 2022-04-29 ENCOUNTER — Encounter (HOSPITAL_COMMUNITY): Payer: Self-pay

## 2022-04-29 NOTE — Progress Notes (Signed)
Surgery orders requested via Epic inbox. °

## 2022-04-29 NOTE — Patient Instructions (Signed)
SURGICAL WAITING ROOM VISITATION Patients having surgery or a procedure may have no more than 2 support people in the waiting area - these visitors may rotate.   Children under the age of 62 must have an adult with them who is not the patient. If the patient needs to stay at the hospital during part of their recovery, the visitor guidelines for inpatient rooms apply. Pre-op nurse will coordinate an appropriate time for 1 support person to accompany patient in pre-op.  This support person may not rotate.    Please refer to the [facility] website for the visitor guidelines for Inpatients (after your surgery is over and you are in a regular room).      Your procedure is scheduled on: 05-06-22   Report to [facility] Main Entrance    Report to admitting at 12:15 PM   Call this number if you have problems the morning of surgery [phone]   Do not eat food :After Midnight.   After Midnight you may have the following liquids until 11:30 AM DAY OF SURGERY  Water Non-Citrus Juices (without pulp, NO RED) Carbonated Beverages Black Coffee (NO MILK/CREAM OR CREAMERS, sugar ok)  Clear Tea (NO MILK/CREAM OR CREAMERS, sugar ok) regular and decaf                             Plain Jell-O (NO RED)                                           Fruit ices (not with fruit pulp, NO RED)                                     Popsicles (NO RED)                                                               Sports drinks like Gatorade (NO RED)                      If you have questions, please contact your surgeon's office.   FOLLOW ANY ADDITIONAL PRE OP INSTRUCTIONS YOU RECEIVED FROM YOUR SURGEON'S OFFICE!!!     Oral Hygiene is also important to reduce your risk of infection.                                    Remember - BRUSH YOUR TEETH THE MORNING OF SURGERY WITH YOUR REGULAR TOOTHPASTE   Do NOT smoke after Midnight   Take these medicines the morning of surgery with A SIP OF WATER:  None  Bring CPAP mask and tubing day of surgery.                              You may not have any metal on your body including hair pins, jewelry, and body piercing             Do  not wear make-up, lotions, powders, perfumes or deodorant  Do not wear nail polish including gel and S&S, artificial/acrylic nails, or any other type of covering on natural nails including finger and toenails. If you have artificial nails, gel coating, etc. that needs to be removed by a nail salon please have this removed prior to surgery or surgery may need to be canceled/ delayed if the surgeon/ anesthesia feels like they are unable to be safely monitored.   Do not shave  48 hours prior to surgery.    Do not bring valuables to the hospital. [place].   Contacts, dentures or bridgework may not be worn into surgery.  DO NOT [place]. PHARMACY WILL DISPENSE MEDICATIONS LISTED ON YOUR MEDICATION LIST TO YOU DURING YOUR ADMISSION [place]!    Patients discharged on the day of surgery will not be allowed to drive home.  Someone NEEDS to stay with you for the first 24 hours after anesthesia.               Please read over the following fact sheets you were given: IF YOU HAVE [place] [phone] Gwen or Tam[place]  If you received a COVID test during your pre-op visit  it is requested that you wear a mask when out in public, stay away from anyone that may not be feeling well and notify your surgeon if you develop symptoms. If you test positive for Covid or have been in contact with anyone that has tested positive in the last 10 days please notify you surgeon.  [facility] - Preparing for Surgery Before surgery, you can play an important role.  Because skin is not sterile, your skin needs to be as free of germs as possible.  You can reduce the number of germs on your skin by washing with CHG  (chlorahexidine gluconate) soap before surgery.  CHG is an antiseptic cleaner which kills germs and bonds with the skin to continue killing germs even after washing. Please DO NOT use if you have an allergy to CHG or antibacterial soaps.  If your skin becomes reddened/irritated stop using the CHG and inform your nurse when you arrive at Short Stay. Do not shave (including legs and underarms) for at least 48 hours prior to the first CHG shower.  You may shave your face/neck.  Please follow these instructions carefully:  1.  Shower with CHG Soap the night before surgery and the  morning of surgery.  2.  If you choose to wash your hair, wash your hair first as usual with your normal  shampoo.  3.  After you shampoo, rinse your hair and body thoroughly to remove the shampoo.                             4.  Use CHG as you would any other liquid soap.  You can apply chg directly to the skin and wash.  Gently with a scrungie or clean washcloth.  5.  Apply the CHG Soap to your body ONLY FROM THE NECK DOWN.   Do   not use on face/ open                           Wound or open sores. Avoid contact with eyes, ears mouth and   genitals (private  parts).                       Wash face,  Genitals (private parts) with your normal soap.             6.  Wash thoroughly, paying special attention to the area where your    surgery  will be performed.  7.  Thoroughly rinse your body with warm water from the neck down.  8.  DO NOT shower/wash with your normal soap after using and rinsing off the CHG Soap.                9.  Pat yourself dry with a clean towel.            10.  Wear clean pajamas.            11.  Place clean sheets on your bed the night of your first shower and do not  sleep with pets. Day of Surgery : Do not apply any lotions/deodorants the morning of surgery.  Please wear clean clothes to the hospital/surgery center.  FAILURE TO FOLLOW THESE INSTRUCTIONS MAY RESULT IN THE CANCELLATION OF YOUR  SURGERY  PATIENT SIGNATURE_________________________________  NURSE SIGNATURE__________________________________  ________________________________________________________________________

## 2022-04-29 NOTE — Progress Notes (Addendum)
COVID Vaccine Completed:  Yes  Date of COVID positive in last 90 days:  No  PCP - Dr. Louis Matte Cardiologist -  N/A  Chest x-ray - N/A EKG - 04-30-22 Epic Stress Test -  N/A ECHO - 2017 Epic Cardiac Cath -  N/A Pacemaker/ICD device last checked: Spinal Cord Stimulator: N/A Cardiac CT - 2020 Epic Holter Monitor - 2017 Epic  Bowel Prep -  N/A  Sleep Study - Yes, +sleep apnea CPAP - Yes  Fasting Blood Sugar - N/A Checks Blood Sugar _____ times a day  Blood Thinner Instructions:  N/A Aspirin Instructions: Last Dose:  Activity level:  Can go up a flight of stairs and perform activities of daily living without stopping and without symptoms of chest pain or shortness of breath.  Able to exercise without symptoms  Anesthesia review:  Hx of trigeminy, PVC's, HTN.  Took Metoprolol for the PVCs and no longer has any issues, no longer on Metoprolol.  Never saw cardiology.    Patient denies shortness of breath, fever, cough and chest pain at PAT appointment  Patient verbalized understanding of instructions that were given to them at the PAT appointment. Patient was also instructed that they will need to review over the PAT instructions again at home before surgery.

## 2022-04-30 ENCOUNTER — Encounter (HOSPITAL_COMMUNITY)
Admission: RE | Admit: 2022-04-30 | Discharge: 2022-04-30 | Disposition: A | Discharge status: Home / Self Care | Payer: Medicare Other | Source: Ambulatory Visit | Attending: Surgery | Admitting: Surgery

## 2022-04-30 VITALS — BP 115/80 | HR 58 | Temp 97.8°F | Resp 18 | Ht 69.5 in | Wt 225.4 lb

## 2022-04-30 DIAGNOSIS — Z01818 Encounter for other preprocedural examination: Secondary | ICD-10-CM | POA: Insufficient documentation

## 2022-04-30 DIAGNOSIS — D649 Anemia, unspecified: Secondary | ICD-10-CM | POA: Diagnosis not present

## 2022-04-30 DIAGNOSIS — I2510 Atherosclerotic heart disease of native coronary artery without angina pectoris: Secondary | ICD-10-CM | POA: Diagnosis not present

## 2022-04-30 HISTORY — DX: Unspecified osteoarthritis, unspecified site: M19.90

## 2022-04-30 HISTORY — DX: Anemia, unspecified: D64.9

## 2022-04-30 HISTORY — DX: Unspecified malignant neoplasm of skin, unspecified: C44.90

## 2022-04-30 HISTORY — DX: Depression, unspecified: F32.A

## 2022-04-30 HISTORY — DX: Sleep apnea, unspecified: G47.30

## 2022-04-30 LAB — BASIC METABOLIC PANEL
Anion gap: 6 (ref 5–15)
BUN: 15 mg/dL (ref 8–23)
CO2: 27 mmol/L (ref 22–32)
Calcium: 9.5 mg/dL (ref 8.9–10.3)
Chloride: 108 mmol/L (ref 98–111)
Creatinine, Ser: 0.87 mg/dL (ref 0.44–1.00)
GFR, Estimated: >60 mL/min (ref >60)
Glucose, Bld: 97 mg/dL (ref 70–99)
Potassium: 4 mmol/L (ref 3.5–5.1)
Sodium: 141 mmol/L (ref 135–145)

## 2022-04-30 LAB — CBC
HCT: 40.5 % (ref 36.0–46.0)
Hemoglobin: 13 g/dL (ref 12.0–15.0)
MCH: 29.3 pg (ref 26.0–34.0)
MCHC: 32.1 g/dL (ref 30.0–36.0)
MCV: 91.4 fL (ref 80.0–100.0)
Platelets: 307 10*3/uL (ref 150–400)
RBC: 4.43 MIL/uL (ref 3.87–5.11)
RDW: 13.2 % (ref 11.5–15.5)
WBC: 6 10*3/uL (ref 4.0–10.5)
nRBC: 0 % (ref 0.0–0.2)

## 2022-05-01 ENCOUNTER — Encounter (HOSPITAL_COMMUNITY): Payer: Medicare Other

## 2022-05-01 ENCOUNTER — Encounter (HOSPITAL_COMMUNITY): Payer: Self-pay

## 2022-05-06 ENCOUNTER — Ambulatory Visit (HOSPITAL_COMMUNITY): Payer: Medicare Other | Admitting: Physician Assistant

## 2022-05-06 ENCOUNTER — Encounter (HOSPITAL_COMMUNITY): Payer: Self-pay | Admitting: Surgery

## 2022-05-06 ENCOUNTER — Other Ambulatory Visit: Payer: Self-pay

## 2022-05-06 ENCOUNTER — Encounter (HOSPITAL_COMMUNITY)
Admission: RE | Disposition: A | Discharge status: Home / Self Care | Payer: Self-pay | Source: Ambulatory Visit | Attending: Surgery

## 2022-05-06 ENCOUNTER — Ambulatory Visit (HOSPITAL_BASED_OUTPATIENT_CLINIC_OR_DEPARTMENT_OTHER): Payer: Medicare Other | Admitting: Anesthesiology

## 2022-05-06 ENCOUNTER — Ambulatory Visit (HOSPITAL_COMMUNITY)
Admission: RE | Admit: 2022-05-06 | Discharge: 2022-05-06 | Disposition: A | Discharge status: Home / Self Care | Payer: Medicare Other | Source: Ambulatory Visit | Attending: Surgery | Admitting: Surgery

## 2022-05-06 DIAGNOSIS — Z9049 Acquired absence of other specified parts of digestive tract: Secondary | ICD-10-CM | POA: Insufficient documentation

## 2022-05-06 DIAGNOSIS — K219 Gastro-esophageal reflux disease without esophagitis: Secondary | ICD-10-CM | POA: Insufficient documentation

## 2022-05-06 DIAGNOSIS — K828 Other specified diseases of gallbladder: Secondary | ICD-10-CM | POA: Diagnosis not present

## 2022-05-06 DIAGNOSIS — K5900 Constipation, unspecified: Secondary | ICD-10-CM | POA: Diagnosis not present

## 2022-05-06 DIAGNOSIS — K8010 Calculus of gallbladder with chronic cholecystitis without obstruction: Secondary | ICD-10-CM | POA: Diagnosis not present

## 2022-05-06 DIAGNOSIS — I10 Essential (primary) hypertension: Secondary | ICD-10-CM | POA: Diagnosis not present

## 2022-05-06 DIAGNOSIS — I2510 Atherosclerotic heart disease of native coronary artery without angina pectoris: Secondary | ICD-10-CM

## 2022-05-06 DIAGNOSIS — Z87891 Personal history of nicotine dependence: Secondary | ICD-10-CM | POA: Insufficient documentation

## 2022-05-06 DIAGNOSIS — D135 Benign neoplasm of extrahepatic bile ducts: Secondary | ICD-10-CM | POA: Diagnosis not present

## 2022-05-06 DIAGNOSIS — Z90710 Acquired absence of both cervix and uterus: Secondary | ICD-10-CM | POA: Diagnosis not present

## 2022-05-06 DIAGNOSIS — K824 Cholesterolosis of gallbladder: Secondary | ICD-10-CM | POA: Insufficient documentation

## 2022-05-06 DIAGNOSIS — G4730 Sleep apnea, unspecified: Secondary | ICD-10-CM | POA: Insufficient documentation

## 2022-05-06 DIAGNOSIS — D649 Anemia, unspecified: Secondary | ICD-10-CM

## 2022-05-06 HISTORY — PX: CHOLECYSTECTOMY: SHX55

## 2022-05-06 SURGERY — LAPAROSCOPIC CHOLECYSTECTOMY
Anesthesia: General | Site: Abdomen

## 2022-05-06 MED ORDER — LIDOCAINE 2% (20 MG/ML) 5 ML SYRINGE
INTRAMUSCULAR | Status: DC | PRN
  Administered 2022-05-06: 100 mg via INTRAVENOUS

## 2022-05-06 MED ORDER — OXYCODONE HCL 5 MG PO TABS
5.0000 mg | ORAL_TABLET | Freq: Once | ORAL | Status: AC | PRN
  Administered 2022-05-06: 5 mg via ORAL

## 2022-05-06 MED ORDER — AMISULPRIDE (ANTIEMETIC) 5 MG/2ML IV SOLN
10.0000 mg | Freq: Once | INTRAVENOUS | Status: AC
  Administered 2022-05-06: 10 mg via INTRAVENOUS

## 2022-05-06 MED ORDER — CEFAZOLIN SODIUM-DEXTROSE 2-4 GM/100ML-% IV SOLN
2.0000 g | INTRAVENOUS | Status: AC
  Administered 2022-05-06: 2 g via INTRAVENOUS

## 2022-05-06 MED ORDER — BUPIVACAINE-EPINEPHRINE (PF) 0.25% -1:200000 IJ SOLN
INTRAMUSCULAR | Status: AC
  Filled 2022-05-06: qty 30

## 2022-05-06 MED ORDER — HYDROCODONE-ACETAMINOPHEN 5-325 MG PO TABS: 1.0000 | ORAL_TABLET | Freq: Four times a day (QID) | ORAL | 0 refills | Status: AC | PRN

## 2022-05-06 MED ORDER — DEXAMETHASONE SODIUM PHOSPHATE 10 MG/ML IJ SOLN
INTRAMUSCULAR | Status: DC | PRN
  Administered 2022-05-06: 10 mg via INTRAVENOUS

## 2022-05-06 MED ORDER — SODIUM CHLORIDE 0.9 % IR SOLN
Status: DC | PRN
  Administered 2022-05-06 (×2): 1000 mL

## 2022-05-06 MED ORDER — CELECOXIB 200 MG PO CAPS
ORAL_CAPSULE | ORAL | Status: AC
  Administered 2022-05-06: 200 mg via ORAL
  Filled 2022-05-06: qty 1

## 2022-05-06 MED ORDER — ROCURONIUM BROMIDE 10 MG/ML (PF) SYRINGE
PREFILLED_SYRINGE | INTRAVENOUS | Status: DC | PRN
  Administered 2022-05-06: 60 mg via INTRAVENOUS

## 2022-05-06 MED ORDER — MIDAZOLAM HCL 2 MG/2ML IJ SOLN
INTRAMUSCULAR | Status: DC | PRN
  Administered 2022-05-06: 2 mg via INTRAVENOUS

## 2022-05-06 MED ORDER — CELECOXIB 200 MG PO CAPS: 200.0000 mg | ORAL_CAPSULE | ORAL | Status: AC

## 2022-05-06 MED ORDER — AMISULPRIDE (ANTIEMETIC) 5 MG/2ML IV SOLN
INTRAVENOUS | Status: AC
  Filled 2022-05-06: qty 4

## 2022-05-06 MED ORDER — LACTATED RINGERS IV SOLN: INTRAVENOUS | Status: DC

## 2022-05-06 MED ORDER — CEFAZOLIN SODIUM-DEXTROSE 2-4 GM/100ML-% IV SOLN
INTRAVENOUS | Status: AC
  Filled 2022-05-06: qty 100

## 2022-05-06 MED ORDER — OXYCODONE HCL 5 MG/5ML PO SOLN: 5.0000 mg | Freq: Once | ORAL | Status: AC | PRN

## 2022-05-06 MED ORDER — FENTANYL CITRATE (PF) 100 MCG/2ML IJ SOLN: 25.0000 ug | INTRAMUSCULAR | Status: DC | PRN

## 2022-05-06 MED ORDER — PHENYLEPHRINE 80 MCG/ML (10ML) SYRINGE FOR IV PUSH (FOR BLOOD PRESSURE SUPPORT)
PREFILLED_SYRINGE | INTRAVENOUS | Status: DC | PRN
  Administered 2022-05-06: 160 ug via INTRAVENOUS

## 2022-05-06 MED ORDER — EPHEDRINE SULFATE-NACL 50-0.9 MG/10ML-% IV SOSY
PREFILLED_SYRINGE | INTRAVENOUS | Status: DC | PRN
  Administered 2022-05-06: 10 mg via INTRAVENOUS

## 2022-05-06 MED ORDER — FENTANYL CITRATE (PF) 250 MCG/5ML IJ SOLN
INTRAMUSCULAR | Status: AC
  Filled 2022-05-06: qty 5

## 2022-05-06 MED ORDER — BUPIVACAINE-EPINEPHRINE 0.25% -1:200000 IJ SOLN
INTRAMUSCULAR | Status: DC | PRN
  Administered 2022-05-06: 23 mL

## 2022-05-06 MED ORDER — ONDANSETRON HCL 4 MG/2ML IJ SOLN
INTRAMUSCULAR | Status: DC | PRN
  Administered 2022-05-06: 4 mg via INTRAVENOUS

## 2022-05-06 MED ORDER — CHLORHEXIDINE GLUCONATE 0.12 % MT SOLN
OROMUCOSAL | Status: AC
  Administered 2022-05-06: 15 mL via OROMUCOSAL
  Filled 2022-05-06: qty 15

## 2022-05-06 MED ORDER — OXYCODONE HCL 5 MG PO TABS
ORAL_TABLET | ORAL | Status: AC
  Filled 2022-05-06: qty 1

## 2022-05-06 MED ORDER — SUGAMMADEX SODIUM 200 MG/2ML IV SOLN
INTRAVENOUS | Status: DC | PRN
  Administered 2022-05-06: 200 mg via INTRAVENOUS

## 2022-05-06 MED ORDER — CHLORHEXIDINE GLUCONATE 0.12 % MT SOLN: 15.0000 mL | Freq: Once | OROMUCOSAL | Status: AC

## 2022-05-06 MED ORDER — 0.9 % SODIUM CHLORIDE (POUR BTL) OPTIME
TOPICAL | Status: DC | PRN
  Administered 2022-05-06: 1000 mL

## 2022-05-06 MED ORDER — FENTANYL CITRATE (PF) 250 MCG/5ML IJ SOLN
INTRAMUSCULAR | Status: DC | PRN
  Administered 2022-05-06: 100 ug via INTRAVENOUS

## 2022-05-06 MED ORDER — MIDAZOLAM HCL 2 MG/2ML IJ SOLN
INTRAMUSCULAR | Status: AC
  Filled 2022-05-06: qty 2

## 2022-05-06 MED ORDER — PROPOFOL 10 MG/ML IV BOLUS
INTRAVENOUS | Status: DC | PRN
  Administered 2022-05-06: 150 mg via INTRAVENOUS

## 2022-05-06 MED ORDER — ORAL CARE MOUTH RINSE: 15.0000 mL | Freq: Once | OROMUCOSAL | Status: AC

## 2022-05-06 MED ORDER — ACETAMINOPHEN 500 MG PO TABS: 1000.0000 mg | ORAL_TABLET | ORAL | Status: DC

## 2022-05-06 SURGICAL SUPPLY — 38 items
ADH SKN CLS APL DERMABOND .7 (GAUZE/BANDAGES/DRESSINGS) ×1
APL PRP STRL LF DISP 70% ISPRP (MISCELLANEOUS) ×1
APPLIER CLIP 5 13 M/L LIGAMAX5 (MISCELLANEOUS) ×1
BAG SPEC RTRVL LRG 6X4 10 (ENDOMECHANICALS) ×1
CANISTER SUCT 3000ML PPV (MISCELLANEOUS) ×1 IMPLANT
CHLORAPREP W/TINT 26 (MISCELLANEOUS) ×1 IMPLANT
CLIP APPLIE 5 13 M/L LIGAMAX5 (MISCELLANEOUS) ×1 IMPLANT
COVER SURGICAL LIGHT HANDLE (MISCELLANEOUS) ×1 IMPLANT
DERMABOND ADVANCED .7 DNX12 (GAUZE/BANDAGES/DRESSINGS) ×1 IMPLANT
ELECT REM PT RETURN 9FT ADLT (ELECTROSURGICAL) ×1
ELECTRODE REM PT RTRN 9FT ADLT (ELECTROSURGICAL) ×1 IMPLANT
ENDOLOOP SUT PDS II  0 18 (SUTURE) ×1
ENDOLOOP SUT PDS II 0 18 (SUTURE) IMPLANT
GLOVE BIOGEL PI IND STRL 6 (GLOVE) ×1 IMPLANT
GLOVE BIOGEL PI MICRO STRL 5.5 (GLOVE) ×1 IMPLANT
GOWN STRL REUS W/ TWL LRG LVL3 (GOWN DISPOSABLE) ×3 IMPLANT
GOWN STRL REUS W/TWL LRG LVL3 (GOWN DISPOSABLE) ×3
KIT BASIN OR (CUSTOM PROCEDURE TRAY) ×1 IMPLANT
KIT TURNOVER KIT B (KITS) ×1 IMPLANT
L-HOOK LAP DISP 36CM (ELECTROSURGICAL) ×1
LHOOK LAP DISP 36CM (ELECTROSURGICAL) ×1 IMPLANT
NS IRRIG 1000ML POUR BTL (IV SOLUTION) ×1 IMPLANT
PAD ARMBOARD 7.5X6 YLW CONV (MISCELLANEOUS) ×1 IMPLANT
PENCIL BUTTON HOLSTER BLD 10FT (ELECTRODE) ×1 IMPLANT
POUCH SPECIMEN RETRIEVAL 10MM (ENDOMECHANICALS) IMPLANT
SCISSORS LAP 5X35 DISP (ENDOMECHANICALS) ×1 IMPLANT
SET IRRIG TUBING LAPAROSCOPIC (IRRIGATION / IRRIGATOR) ×1 IMPLANT
SET TUBE SMOKE EVAC HIGH FLOW (TUBING) ×1 IMPLANT
SLEEVE ENDOPATH XCEL 5M (ENDOMECHANICALS) ×2 IMPLANT
SUT MNCRL AB 4-0 PS2 18 (SUTURE) ×1 IMPLANT
TOWEL GREEN STERILE (TOWEL DISPOSABLE) ×1 IMPLANT
TOWEL GREEN STERILE FF (TOWEL DISPOSABLE) ×1 IMPLANT
TRAY LAPAROSCOPIC MC (CUSTOM PROCEDURE TRAY) ×1 IMPLANT
TROCAR BALLN 12MMX100 BLUNT (TROCAR) IMPLANT
TROCAR XCEL BLUNT TIP 100MML (ENDOMECHANICALS) ×1 IMPLANT
TROCAR Z-THREAD OPTICAL 5X100M (TROCAR) ×1 IMPLANT
WARMER LAPAROSCOPE (MISCELLANEOUS) ×1 IMPLANT
WATER STERILE IRR 1000ML POUR (IV SOLUTION) ×1 IMPLANT

## 2022-05-06 NOTE — Transfer of Care (Signed)
Immediate Anesthesia Transfer of Care Note  Patient: Nicole Newman  Procedure(s) Performed: LAPAROSCOPIC CHOLECYSTECTOMY (Abdomen)  Patient Location: PACU  Anesthesia Type:General  Level of Consciousness: awake, alert , and oriented  Airway & Oxygen Therapy: Patient Spontanous Breathing  Post-op Assessment: Report given to RN and Post -op Vital signs reviewed and stable  Post vital signs: Reviewed and stable  Last Vitals:  Vitals Value Taken Time  BP 163/73 05/06/22 1745  Temp    Pulse 69 05/06/22 1747  Resp 12 05/06/22 1747  SpO2 95 % 05/06/22 1747  Vitals shown include unvalidated device data.  Last Pain:  Vitals:   05/06/22 1231  TempSrc: Oral  PainSc: 2       Patients Stated Pain Goal: 3 (31/54/00 8676)  Complications: No notable events documented.

## 2022-05-06 NOTE — Anesthesia Procedure Notes (Signed)
Procedure Name: Intubation Date/Time: 05/06/2022 4:38 PM  Performed by: Carolan Clines, CRNAPre-anesthesia Checklist: Patient identified, Emergency Drugs available, Suction available and Patient being monitored Patient Re-evaluated:Patient Re-evaluated prior to induction Oxygen Delivery Method: Circle System Utilized Preoxygenation: Pre-oxygenation with 100% oxygen Induction Type: IV induction Ventilation: Mask ventilation without difficulty Laryngoscope Size: Mac and 3 Grade View: Grade II Tube type: Oral Tube size: 7.0 mm Number of attempts: 1 Airway Equipment and Method: Stylet Placement Confirmation: ETT inserted through vocal cords under direct vision, positive ETCO2 and breath sounds checked- equal and bilateral Secured at: 22 cm Tube secured with: Tape Dental Injury: Teeth and Oropharynx as per pre-operative assessment

## 2022-05-06 NOTE — Anesthesia Preprocedure Evaluation (Addendum)
Anesthesia Evaluation  Patient identified by MRN, date of birth, ID band Patient awake    Reviewed: Allergy & Precautions, NPO status , Patient's Chart, lab work & pertinent test results  History of Anesthesia Complications (+) PONV and history of anesthetic complications  Airway Mallampati: II  TM Distance: >3 FB Neck ROM: Full    Dental  (+) Missing,    Pulmonary sleep apnea and Continuous Positive Airway Pressure Ventilation , former smoker   Pulmonary exam normal        Cardiovascular hypertension, Pt. on medications Normal cardiovascular exam     Neuro/Psych    Depression    negative neurological ROS     GI/Hepatic Neg liver ROS,GERD  Medicated and Controlled,,GALLBLADDER ADENOMYOMATOSIS, RUQ PAIN   Endo/Other  negative endocrine ROS    Renal/GU negative Renal ROS  negative genitourinary   Musculoskeletal  (+) Arthritis ,    Abdominal   Peds  Hematology negative hematology ROS (+)   Anesthesia Other Findings Day of surgery medications reviewed with patient.  Reproductive/Obstetrics negative OB ROS                              Anesthesia Physical Anesthesia Plan  ASA: 2  Anesthesia Plan: General   Post-op Pain Management: Tylenol PO (pre-op)*   Induction: Intravenous  PONV Risk Score and Plan: 4 or greater and Treatment may vary due to age or medical condition, Ondansetron, Dexamethasone and Midazolam  Airway Management Planned: Oral ETT  Additional Equipment: None  Intra-op Plan:   Post-operative Plan: Extubation in OR  Informed Consent: I have reviewed the patients History and Physical, chart, labs and discussed the procedure including the risks, benefits and alternatives for the proposed anesthesia with the patient or authorized representative who has indicated his/her understanding and acceptance.     Dental advisory given  Plan Discussed with:  CRNA  Anesthesia Plan Comments:          Anesthesia Quick Evaluation

## 2022-05-06 NOTE — Op Note (Signed)
Date: 05/06/22  Patient: Nicole Newman MRN: [id number]  Preoperative Diagnosis: RUQ pain, gallbladder adenomyomatosis Postoperative Diagnosis: Same  Procedure: Laparoscopic cholecystectomy  Surgeon: Michaelle Birks, MD Assistant: Winfield Rast, MD (Resident)  EBL: Minimal  Anesthesia: General endotracheal  Specimens: Gallbladder  Indications: [name] is a 71 yo female who has been having worsening postprandial RUQ abdominal pain for the last few months. She had an [place] that showed adenomyomatosis but no stones, and HIDA was normal. After a discussion of the risks and benefits of surgery, she agreed to proceed with cholecystectomy given adenomyomatosis with symptoms of biliary dysfunction.  Findings: Grossly normal appearance of the gallbladder.  Procedure details: Informed consent was obtained in the preoperative area prior to the procedure. The patient was brought to the operating room and placed on the table in the supine position. General anesthesia was induced and appropriate lines and drains were placed for intraoperative monitoring. Perioperative antibiotics were administered per SCIP guidelines. The abdomen was prepped and draped in the usual sterile fashion. A pre-procedure timeout was taken verifying patient identity, surgical site and procedure to be performed.  A small infraumbilical skin incision was made, the subcutaneous tissue was divided with cautery, and the umbilical stalk was grasped and elevated. The fascia was incised and the peritoneal cavity was directly visualized. A 3m Hassan trocar was placed and the abdomen was insufflated. The peritoneal cavity was inspected with no evidence of visceral or vascular injury. Three 542mports were placed in the right subcostal margin, all under direct visualization. The fundus of the gallbladder was grasped and retracted cephalad. The infundibulum was retracted laterally. The cystic triangle was dissected out using cautery and blunt  dissection, and the critical view of safety was obtained. The cystic duct and cystic artery were clipped and ligated. The clips did not completely cross the cystic duct, so the cystic duct stump was closed with a PDS endoloop. The gallbladder was taken off the liver using cautery, and the specimen was placed in an endocatch bag. The surgical site was irrigated with saline until the effluent was clear. Hemostasis was achieved in the gallbladder fossa using cautery. The cystic duct and artery stumps were visually inspected and there was no evidence of bile leak or bleeding. The ports were removed under direct visualization and the abdomen was desufflated. The specimen was extracted via the umbilical port site. The umbilical port site fascia was closed with a 0 vicryl suture. The skin at all port sites was closed with 4-0 monocryl subcuticular suture. Dermabond was applied.  The patient tolerated the procedure well with no apparent complications. All counts were correct x2 at the end of the procedure. The patient was extubated and taken to PACU in stable condition.  ShMichaelle BirksMD 05/06/22 5:44 PM

## 2022-05-06 NOTE — Anesthesia Postprocedure Evaluation (Signed)
Anesthesia Post Note  Patient: Nicole Newman  Procedure(s) Performed: LAPAROSCOPIC CHOLECYSTECTOMY (Abdomen)     Patient location during evaluation: PACU Anesthesia Type: General Level of consciousness: awake and alert Pain management: pain level controlled Vital Signs Assessment: post-procedure vital signs reviewed and stable Respiratory status: spontaneous breathing, nonlabored ventilation, respiratory function stable and patient connected to nasal cannula oxygen Cardiovascular status: blood pressure returned to baseline and stable Postop Assessment: no apparent nausea or vomiting Anesthetic complications: no   No notable events documented.  Last Vitals:  Vitals:   05/06/22 1800 05/06/22 1815  BP: (!) 148/67 (!) 143/73  Pulse: 70 70  Resp: 15 13  Temp:  (!) 36.4 C  SpO2: 91% 96%    Last Pain:  Vitals:   05/06/22 1815  TempSrc:   PainSc: 3                  Indigo Chaddock P Dajuan Turnley

## 2022-05-06 NOTE — H&P (Signed)
Nicole Newman is an 72 y.o. female.    HPI: Mark Benecke is a 71 y.o. female who was referred to me for evaluation of RUQ pain and gallbladder adenomyomatosis. She has been having intermittent episodes of RUQ pain for several years, which has worsened in the last 5 months. She says she now has almost constant discomfort and pressure in the RUQ, which worsens after eating. This is associated with bloating and nausea. She has a history of chronic constipation, but in the last few months has developed more loose, foul-smelling stools. She has known GERD but says this has improved with weight loss, and her current symptoms feel distinctly different from her GERD symptoms.  She had an EGD in 2020 with dilation of stricture at the GE junction. The stomach and duodenum were normal in appearance. She had a RUQ [place] on 03/11/22, which showed no gallstones but did show adenomyomatosis. She then had a HIDA on 10/19, which showed an EF of 38%.   Her prior abdominal surgeries include an appendectomy and hysterectomy. She is a retired Marine scientist. 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   Past Medical History:  Diagnosis Date   Anemia    Arthritis    Depression    Dysrhythmia    PVC'S 2017   Family history of adverse reaction to anesthesia    states mother had small MI during anesthesia   GERD (gastroesophageal reflux disease)    High cholesterol    Hypertension    states under control with med., has been on med. x 2.5 yr.   Lateral meniscal tear left knee 01/2016   left   Medial meniscus tear left knee 01/2016   left   PONV (postoperative nausea and vomiting)    PVC's (premature ventricular contractions)    Skin cancer    Arm and leg   Sleep apnea     Past Surgical History:  Procedure Laterality Date   APPENDECTOMY     BLEPHAROPLASTY Bilateral    BREAST BIOPSY Right 11/17/2012   PASH   BREAST REDUCTION SURGERY     CHONDROPLASTY Left 02/27/2016    Procedure: CHONDROPLASTY;  Surgeon: Elsie Saas, MD;  Location: [place];  Service: Orthopedics;  Laterality: Left;   DIAGNOSTIC LAPAROSCOPY     ESOPHAGEAL DILATION     ESOPHAGOGASTRODUODENOSCOPY (EGD) WITH PROPOFOL N/A 04/01/2019   Procedure: ESOPHAGOGASTRODUODENOSCOPY (EGD) WITH PROPOFOL;  Surgeon: Laurence Spates, MD;  Location: [facility];  Service: Endoscopy;  Laterality: N/A;   EXCISION MORTON'S NEUROMA Right    foot   KNEE ARTHROSCOPY WITH LATERAL MENISECTOMY Left 02/27/2016   Procedure: KNEE ARTHROSCOPY WITH LATERAL and MEDIAL MENISCECTOMY, left;  Surgeon: Elsie Saas, MD;  Location: [place];  Service: Orthopedics;  Laterality: Left;   LAPAROSCOPIC BILATERAL SALPINGO OOPHERECTOMY Bilateral 07/06/2018   Procedure: LAPAROSCOPIC BILATERAL SALPINGO OOPHORECTOMY;  Surgeon: Ward, Honor Loh, MD;  Location: [facility];  Service: Gynecology;  Laterality: Bilateral;   LAPAROSCOPIC HYSTERECTOMY N/A 07/06/2018   Procedure: HYSTERECTOMY TOTAL LAPAROSCOPIC;  Surgeon: Ward, Honor Loh, MD;  Location: [facility];  Service: Gynecology;  Laterality: N/A;   REDUCTION MAMMAPLASTY Bilateral    SAVORY DILATION N/A 04/01/2019   Procedure: SAVORY DILATION;  Surgeon: Laurence Spates, MD;  Location: [facility];  Service: Endoscopy;  Laterality: N/A;   SKIN CANCER EXCISION      Family History  Problem Relation Age of Onset   Anesthesia problems Mother  had small MI during anesthesia   Social History:  reports that she quit smoking about 40 years ago. Her smoking use included cigarettes. She has a 10.00 pack-year smoking history. She has never used smokeless tobacco. She reports current alcohol use. She reports that she does not use drugs.  Allergies:  Allergies  Allergen Reactions   Lisinopril Cough   Norvasc [Amlodipine] Swelling and Other (See Comments)    SWELLING OF ANKLES   Neomycin Swelling    Eyes became Red and Swollen    Statins Other (See Comments)     NO STATINS - Muscle aches    Medications Prior to Admission  Medication Sig Dispense Refill   acetaminophen (TYLENOL) 650 MG CR tablet Take 650 mg by mouth every 8 (eight) hours as needed for pain.     calcium carbonate (TUMS - DOSED IN MG ELEMENTAL CALCIUM) 500 MG chewable tablet Chew 1 tablet by mouth daily as needed for indigestion or heartburn.     famotidine (PEPCID) 40 MG tablet Take 40 mg by mouth at bedtime.     losartan (COZAAR) 50 MG tablet Take 50 mg by mouth at bedtime.     Multiple Vitamins-Minerals (MULTIVITAMIN WITH MINERALS) tablet Take 1 tablet by mouth 3 (three) times a week.     Propylene Glycol (SYSTANE BALANCE) 0.6 % SOLN Place 1 drop into both eyes as needed (dry eyes).     REPATHA SURECLICK 700 MG/ML SOAJ Inject 140 mg into the skin every 14 (fourteen) days.     simethicone (GAS-X EXTRA STRENGTH) 125 MG chewable tablet Chew 125 mg by mouth every 6 (six) hours as needed for flatulence.      No results found for this or any previous visit (from the past 48 hour(s)). No results found.  Review of Systems  Blood pressure (!) 140/76, pulse (!) 58, temperature 98 F (36.7 C), temperature source Oral, resp. rate 16, height 5' 9.5" (1.765 m), weight 102.2 kg, SpO2 96 %. Physical Exam Constitutional:      General: She is not in acute distress.    Appearance: Normal appearance.  HENT:     Head: Normocephalic and atraumatic.  Eyes:     General: No scleral icterus.    Conjunctiva/sclera: Conjunctivae normal.  Pulmonary:     Effort: Pulmonary effort is normal. No respiratory distress.  Abdominal:     Palpations: Abdomen is soft.  Musculoskeletal:        General: Normal range of motion.  Skin:    General: Skin is warm and dry.     Coloration: Skin is not jaundiced.  Neurological:     General: No focal deficit present.     Mental Status: She is alert and oriented to person, place, and time.      Assessment/Plan This is a 71 yo female with persistent RUQ  abdominal pain and adenomyomatosis of the gallbladder. Proceed to OR for lap cholecystectomy. Informed consent obtained. Postoperative instructions reviewed. Plan for discharge home from PACU.  Dwan Bolt, MD 05/06/2022, 4:13 PM

## 2022-05-06 NOTE — Discharge Instructions (Addendum)
[facility] DISCHARGE INSTRUCTIONS  Activity No heavy lifting greater than 15 pounds for 4 weeks after surgery. Ok to shower in 24 hours, but do not bathe or submerge incisions underwater. Do not drive while taking narcotic pain medication.  Wound Care Your incisions are covered with skin glue called Dermabond. This will peel off on its own over time. You may shower and allow warm soapy water to run over your incisions. Gently pat dry. Do not submerge your incision underwater. Monitor your incision for any new redness, tenderness, or drainage.  When to Call us: Fever greater than 100.5 New redness, drainage, or swelling at incision site Severe pain, nausea, or vomiting Jaundice (yellowing of the whites of the eyes or skin)  Follow-up You have an appointment scheduled with Dr. Zenia Resides on May 21, 2022 at 10:20am. This will be at the [facility] Surgery office at [address], [place], [place], [place]. Please arrive at least 15 minutes prior to your scheduled appointment time.  For questions or concerns, please call the office at [phone].

## 2022-05-07 ENCOUNTER — Encounter (HOSPITAL_COMMUNITY): Payer: Self-pay | Admitting: Surgery

## 2022-05-08 LAB — SURGICAL PATHOLOGY

## 2022-06-04 DIAGNOSIS — E7800 Pure hypercholesterolemia, unspecified: Secondary | ICD-10-CM | POA: Diagnosis not present

## 2022-06-04 DIAGNOSIS — Z5181 Encounter for therapeutic drug level monitoring: Secondary | ICD-10-CM | POA: Diagnosis not present

## 2022-07-08 DIAGNOSIS — M25562 Pain in left knee: Secondary | ICD-10-CM | POA: Diagnosis not present

## 2022-08-20 DIAGNOSIS — D0462 Carcinoma in situ of skin of left upper limb, including shoulder: Secondary | ICD-10-CM | POA: Diagnosis not present

## 2022-08-20 DIAGNOSIS — D1721 Benign lipomatous neoplasm of skin and subcutaneous tissue of right arm: Secondary | ICD-10-CM | POA: Diagnosis not present

## 2022-08-20 DIAGNOSIS — D2262 Melanocytic nevi of left upper limb, including shoulder: Secondary | ICD-10-CM | POA: Diagnosis not present

## 2022-08-20 DIAGNOSIS — C44712 Basal cell carcinoma of skin of right lower limb, including hip: Secondary | ICD-10-CM | POA: Diagnosis not present

## 2022-08-20 DIAGNOSIS — L570 Actinic keratosis: Secondary | ICD-10-CM | POA: Diagnosis not present

## 2022-08-20 DIAGNOSIS — D1801 Hemangioma of skin and subcutaneous tissue: Secondary | ICD-10-CM | POA: Diagnosis not present

## 2022-09-05 DIAGNOSIS — L649 Androgenic alopecia, unspecified: Secondary | ICD-10-CM | POA: Diagnosis not present

## 2022-09-05 DIAGNOSIS — Z85828 Personal history of other malignant neoplasm of skin: Secondary | ICD-10-CM | POA: Diagnosis not present

## 2022-09-16 DIAGNOSIS — D0462 Carcinoma in situ of skin of left upper limb, including shoulder: Secondary | ICD-10-CM | POA: Diagnosis not present

## 2022-09-16 DIAGNOSIS — C44712 Basal cell carcinoma of skin of right lower limb, including hip: Secondary | ICD-10-CM | POA: Diagnosis not present

## 2022-09-17 ENCOUNTER — Other Ambulatory Visit: Payer: Self-pay | Admitting: Orthopaedic Surgery

## 2022-09-17 DIAGNOSIS — M25511 Pain in right shoulder: Secondary | ICD-10-CM | POA: Diagnosis not present

## 2022-09-17 DIAGNOSIS — M542 Cervicalgia: Secondary | ICD-10-CM | POA: Diagnosis not present

## 2022-09-17 DIAGNOSIS — M75100 Unspecified rotator cuff tear or rupture of unspecified shoulder, not specified as traumatic: Secondary | ICD-10-CM

## 2022-10-08 ENCOUNTER — Encounter: Payer: Self-pay | Admitting: Orthopaedic Surgery

## 2022-10-09 ENCOUNTER — Ambulatory Visit
Admission: RE | Admit: 2022-10-09 | Discharge: 2022-10-09 | Disposition: A | Discharge status: Home / Self Care | Payer: Medicare Other | Source: Ambulatory Visit | Attending: Orthopaedic Surgery | Admitting: Orthopaedic Surgery

## 2022-10-09 DIAGNOSIS — M75100 Unspecified rotator cuff tear or rupture of unspecified shoulder, not specified as traumatic: Secondary | ICD-10-CM

## 2022-10-09 DIAGNOSIS — M25511 Pain in right shoulder: Secondary | ICD-10-CM | POA: Diagnosis not present

## 2022-10-11 ENCOUNTER — Other Ambulatory Visit: Payer: Self-pay | Admitting: Internal Medicine

## 2022-10-11 DIAGNOSIS — Z1231 Encounter for screening mammogram for malignant neoplasm of breast: Secondary | ICD-10-CM

## 2022-10-21 ENCOUNTER — Ambulatory Visit
Admission: RE | Admit: 2022-10-21 | Discharge: 2022-10-21 | Disposition: A | Discharge status: Home / Self Care | Payer: Medicare Other | Source: Ambulatory Visit | Attending: Internal Medicine | Admitting: Internal Medicine

## 2022-10-21 DIAGNOSIS — Z1231 Encounter for screening mammogram for malignant neoplasm of breast: Secondary | ICD-10-CM

## 2022-10-22 ENCOUNTER — Other Ambulatory Visit: Payer: Self-pay | Admitting: Orthopaedic Surgery

## 2022-10-22 DIAGNOSIS — S46011A Strain of muscle(s) and tendon(s) of the rotator cuff of right shoulder, initial encounter: Secondary | ICD-10-CM | POA: Diagnosis not present

## 2022-10-22 DIAGNOSIS — M25511 Pain in right shoulder: Secondary | ICD-10-CM | POA: Diagnosis not present

## 2022-10-22 DIAGNOSIS — Z01818 Encounter for other preprocedural examination: Secondary | ICD-10-CM

## 2022-10-22 DIAGNOSIS — Z01812 Encounter for preprocedural laboratory examination: Secondary | ICD-10-CM | POA: Diagnosis not present

## 2022-10-24 ENCOUNTER — Ambulatory Visit
Admission: RE | Admit: 2022-10-24 | Discharge: 2022-10-24 | Disposition: A | Discharge status: Home / Self Care | Payer: Medicare Other | Source: Ambulatory Visit | Attending: Orthopaedic Surgery | Admitting: Orthopaedic Surgery

## 2022-10-24 DIAGNOSIS — Z01818 Encounter for other preprocedural examination: Secondary | ICD-10-CM | POA: Diagnosis not present

## 2022-11-21 ENCOUNTER — Other Ambulatory Visit: Payer: Medicare Other

## 2023-01-15 DIAGNOSIS — M75121 Complete rotator cuff tear or rupture of right shoulder, not specified as traumatic: Secondary | ICD-10-CM | POA: Diagnosis not present

## 2023-01-15 DIAGNOSIS — M25711 Osteophyte, right shoulder: Secondary | ICD-10-CM | POA: Diagnosis not present

## 2023-01-15 DIAGNOSIS — M659 Synovitis and tenosynovitis, unspecified: Secondary | ICD-10-CM | POA: Diagnosis not present

## 2023-01-15 DIAGNOSIS — M19011 Primary osteoarthritis, right shoulder: Secondary | ICD-10-CM | POA: Diagnosis not present

## 2023-01-15 DIAGNOSIS — G8918 Other acute postprocedural pain: Secondary | ICD-10-CM | POA: Diagnosis not present

## 2023-01-17 ENCOUNTER — Encounter (HOSPITAL_COMMUNITY): Payer: Self-pay | Admitting: Occupational Therapy

## 2023-01-17 ENCOUNTER — Ambulatory Visit (HOSPITAL_COMMUNITY): Payer: Medicare Other | Attending: Physician Assistant | Admitting: Occupational Therapy

## 2023-01-17 DIAGNOSIS — M25611 Stiffness of right shoulder, not elsewhere classified: Secondary | ICD-10-CM | POA: Insufficient documentation

## 2023-01-17 DIAGNOSIS — M25511 Pain in right shoulder: Secondary | ICD-10-CM | POA: Diagnosis not present

## 2023-01-17 DIAGNOSIS — R29898 Other symptoms and signs involving the musculoskeletal system: Secondary | ICD-10-CM | POA: Diagnosis not present

## 2023-01-17 NOTE — Therapy (Signed)
OUTPATIENT OCCUPATIONAL THERAPY ORTHO EVALUATION  Patient Name: Nicole Newman MRN: [id number] DOB:[birth year], 72 y.o., female Today's Date: 01/17/2023   END OF SESSION:  OT End of Session - 01/17/23 1535     Visit Number 1    Number of Visits 17    Date for OT Re-Evaluation 03/14/23   mini reassessment (02/08/23)   Authorization Type BCBS Medicare    Progress Note Due on Visit 10    OT Start Time 0804    OT Stop Time [phone]    OT Time Calculation (min) 39 min    Activity Tolerance Patient tolerated treatment well    Behavior During Therapy [facility] for tasks assessed/performed             Past Medical History:  Diagnosis Date   Anemia    Arthritis    Depression    Dysrhythmia    PVC'S 2017   Family history of adverse reaction to anesthesia    states mother had small MI during anesthesia   GERD (gastroesophageal reflux disease)    High cholesterol    Hypertension    states under control with med., has been on med. x 2.5 yr.   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   BREAST REDUCTION SURGERY     CHOLECYSTECTOMY N/A 05/06/2022   Procedure: LAPAROSCOPIC CHOLECYSTECTOMY;  Surgeon: Fritzi Mandes, MD;  Location[facility];  Service: General;  Laterality: N/A;   CHONDROPLASTY Left 02/27/2016   Procedure: CHONDROPLASTY;  Surgeon: Salvatore Marvel, MD;  Location: [facility];  Service: Orthopedics;  Laterality: Left;   DIAGNOSTIC LAPAROSCOPY     ESOPHAGEAL DILATION     ESOPHAGOGASTRODUODENOSCOPY (EGD) WITH PROPOFOL N/A 04/01/2019   Procedure: ESOPHAGOGASTRODUODENOSCOPY (EGD) WITH PROPOFOL;  Surgeon: Carman Ching, MD;  [facility];  Service: Endoscopy;  Laterality: N/A;   EXCISION MORTON'S NEUROMA Right    foot   KNEE ARTHROSCOPY WITH LATERAL MENISECTOMY Left 02/27/2016   Procedure: KNEE ARTHROSCOPY WITH LATERAL and MEDIAL MENISCECTOMY, left;  Surgeon: Salvatore Marvel, MD;  Location: [facility];  Service: Orthopedics;  Laterality: Left;   LAPAROSCOPIC BILATERAL SALPINGO OOPHERECTOMY Bilateral 07/06/2018   Procedure: LAPAROSCOPIC BILATERAL SALPINGO OOPHORECTOMY;  Surgeon: Ward, Elenora Fender, MD;  Location: [facility];  Service: Gynecology;  Laterality: Bilateral;   LAPAROSCOPIC HYSTERECTOMY N/A 07/06/2018   Procedure: HYSTERECTOMY TOTAL LAPAROSCOPIC;  Surgeon: Ward, Elenora Fender, MD;  Location: [facility];  Service: Gynecology;  Laterality: N/A;   REDUCTION MAMMAPLASTY Bilateral    SAVORY DILATION N/A 04/01/2019   Procedure: SAVORY DILATION;  Surgeon: Carman Ching, MD;  [facility];  Service: Endoscopy;  Laterality: N/A;   SKIN CANCER EXCISION     Patient Active Problem List   Diagnosis Date Noted   Hypertension    GERD (gastroesophageal reflux disease)    Family history of adverse reaction to anesthesia    Medial meniscus tear 01/30/2016   Lateral meniscal tear left knee 01/30/2016   Palpitations 07/31/2015   Ventricular premature beats 07/31/2015   Tachycardia, unspecified 07/31/2015    PCP: Margaretann Loveless, MD - [facility] Sprang  Internal Medicine REFERRING PROVIDER: Ramond Marrow, MD  ONSET DATE: 01/15/23  REFERRING DIAG: R Reverse Total Shoulder  THERAPY DIAG:  Acute pain of right shoulder  Stiffness of right shoulder, not elsewhere classified  Other symptoms and signs involving the musculoskeletal system  Rationale for Evaluation and Treatment: Rehabilitation  SUBJECTIVE:   SUBJECTIVE STATEMENT: "Today has been better than yesterday" Pt accompanied by: self  PERTINENT HISTORY: Pt had a fall 2 years ago resulting in torn ligaments and increased pain/lack of mobility.   PRECAUTIONS:  Shoulder  WEIGHT BEARING RESTRICTIONS: Yes >1lb  PAIN:  Are you having pain? Yes: NPRS scale: 2/10 Pain location: anterior shoulder girdle Pain description: aching and tender Aggravating factors: nothing Relieving factors: Pain Medications  FALLS: Has patient fallen in last 6 months? Yes. Number of falls 1  PLOF: Independent  PATIENT GOALS: "I want my arm to be back at 100%."  NEXT MD VISIT: 01/23/23  OBJECTIVE:   HAND DOMINANCE: Right  ADLs: Overall ADLs: Pt is unable to dress or bath independently, requiring mod to max assist due to weakness, pain, and limitations of the RUE. Additionally at this time she is requiring total assist with all cooking, cleaning, and yard work.   FUNCTIONAL OUTCOME MEASURES: FOTO: Will complete next session  UPPER EXTREMITY ROM:       Assessed in supine, er/IR adducted  Passive ROM Right eval  Shoulder flexion 120  Shoulder abduction 87  Shoulder internal rotation 90  Shoulder external rotation 17  (Blank rows = not tested)    UPPER EXTREMITY MMT:     Assessed in seated, er/IR adducted  MMT Right eval  Shoulder flexion   Shoulder abduction   Shoulder internal rotation   Shoulder external rotation   Middle trapezius   Lower trapezius   (Blank rows = not tested)  SENSATION: Pt reports that her thumb is numb  EDEMA: Swelling noted in the elbow  OBSERVATIONS: mod to severe fascial restrictions along the biceps, deltoid, scapularis, and pectoralis   TODAY'S TREATMENT:                                                                                                                              DATE: 01/17/23: Evaluation Only    PATIENT EDUCATION: Education details: elbow, wrist, and hand ROM Person educated: Patient Education method: Explanation and Demonstration Education comprehension: verbalized understanding and returned demonstration  HOME EXERCISE PROGRAM: 7/19: Elbow, wrist, and hand ROM  GOALS: Goals reviewed  with patient? Yes   SHORT TERM GOALS: Target date: 02/08/23  Pt will be provided with and educated on HEP to improve mobility in RUE required for use during ADL completion.   Goal status: INITIAL  2.  Pt will increase RUE P/ROM by 40 degrees to improve ability to use RUE during dressing tasks with minimal compensatory techniques.   Goal status: INITIAL  3.  Pt will increase RUE strength to 3+/5 to improve ability to reach for  items at waist to chest height during bathing and grooming tasks.   Goal status: INITIAL   LONG TERM GOALS: Target date: 03/17/23  Pt will decrease pain in RUE to 3/10 or less to improve ability to sleep for 2+ consecutive hours without waking due to pain.   Goal status: INITIAL  2.  Pt will decrease RUE fascial restrictions to min amounts or less to improve mobility required for functional reaching tasks.   Goal status: INITIAL  3.  Pt will increase RUE A/ROM by 50 degrees to improve ability to use RUE when reaching overhead or behind back during dressing and bathing tasks.   Goal status: INITIAL  4.  Pt will increase RUE strength to 4+/5 or greater to improve ability to use RUE when lifting or carrying items during meal preparation/housework/yardwork tasks.   Goal status: INITIAL  5.  Pt will return to highest level of function using RUE as dominant during functional task completion.   Goal status: INITIAL   ASSESSMENT:  CLINICAL IMPRESSION: Patient is a 72 y.o. female who was seen today for occupational therapy evaluation for s/p R reverse total shoulder. Pt presents with increased pain and fascial restrictions, decreased ROM, strength, and functional use of the RUE.   PERFORMANCE DEFICITS: in functional skills including in functional skills including ADLs, IADLs, coordination, tone, ROM, strength, pain, fascial restrictions, muscle spasms, and UE functional use.  IMPAIRMENTS: are limiting patient from ADLs, IADLs, rest and sleep, work, leisure,  and social participation.   COMORBIDITIES: has no other co-morbidities that affects occupational performance. Patient will benefit from skilled OT to address above impairments and improve overall function.  MODIFICATION OR ASSISTANCE TO COMPLETE EVALUATION: No modification of tasks or assist necessary to complete an evaluation.  OT OCCUPATIONAL PROFILE AND HISTORY: Problem focused assessment: Including review of records relating to presenting problem.  CLINICAL DECISION MAKING: LOW - limited treatment options, no task modification necessary  REHAB POTENTIAL: Excellent  EVALUATION COMPLEXITY: Low      PLAN:  OT FREQUENCY: 2x/week  OT DURATION: 8 weeks  PLANNED INTERVENTIONS: self care/ADL training, therapeutic exercise, therapeutic activity, neuromuscular re-education, manual therapy, passive range of motion, splinting, electrical stimulation, ultrasound, moist heat, cryotherapy, patient/family education, and DME and/or AE instructions  RECOMMENDED OTHER SERVICES: N/A  CONSULTED AND AGREED WITH PLAN OF CARE: Patient  PLAN FOR NEXT SESSION: Manual Therapy, P/ROM, Table Slides, Pendulums, Thumb Tacs  Trish Mage, OTR/L [facility] [phone] Cliffie Gingras Rosemarie Beath, OT 01/17/2023, 3:36 PM

## 2023-01-20 ENCOUNTER — Encounter (HOSPITAL_COMMUNITY): Payer: Self-pay | Admitting: Occupational Therapy

## 2023-01-20 ENCOUNTER — Ambulatory Visit (HOSPITAL_COMMUNITY): Payer: Medicare Other | Admitting: Occupational Therapy

## 2023-01-20 DIAGNOSIS — M25511 Pain in right shoulder: Secondary | ICD-10-CM

## 2023-01-20 DIAGNOSIS — R29898 Other symptoms and signs involving the musculoskeletal system: Secondary | ICD-10-CM | POA: Diagnosis not present

## 2023-01-20 DIAGNOSIS — M25611 Stiffness of right shoulder, not elsewhere classified: Secondary | ICD-10-CM | POA: Diagnosis not present

## 2023-01-20 NOTE — Therapy (Signed)
OUTPATIENT OCCUPATIONAL THERAPY ORTHO TREATMENT  Patient Name: Nicole Newman MRN: [id number] DOB:06-09-1951, 72 y.o., female Today's Date: 01/20/2023   END OF SESSION:  OT End of Session - 01/20/23 1234     Visit Number 2    Number of Visits 17    Date for OT Re-Evaluation 03/14/23   mini reassessment (02/08/23)   Authorization Type [employer] Medicare    Progress Note Due on Visit 10    OT Start Time [phone]    OT Stop Time 0904    OT Time Calculation (min) 41 min    Activity Tolerance Patient tolerated treatment well    Behavior During Therapy [facility] for tasks assessed/performed             Past Medical History:  Diagnosis Date   Anemia    Arthritis    Depression    Dysrhythmia    PVC'S 2017   Family history of adverse reaction to anesthesia    states mother had small MI during anesthesia   GERD (gastroesophageal reflux disease)    High cholesterol    Hypertension    states under control with med., has been on med. x 2.5 yr.   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   BREAST REDUCTION SURGERY     CHOLECYSTECTOMY N/A 05/06/2022   Procedure: LAPAROSCOPIC CHOLECYSTECTOMY;  Surgeon: Fritzi Mandes, MD;  Location: [facility];  Service: General;  Laterality: N/A;   CHONDROPLASTY Left 02/27/2016   Procedure: CHONDROPLASTY;  Surgeon: Salvatore Marvel, MD;  Location: [facility];  Service: Orthopedics;  Laterality: Left;   DIAGNOSTIC LAPAROSCOPY     ESOPHAGEAL DILATION     ESOPHAGOGASTRODUODENOSCOPY (EGD) WITH PROPOFOL N/A 04/01/2019   Procedure: ESOPHAGOGASTRODUODENOSCOPY (EGD) WITH PROPOFOL;  Surgeon: Carman Ching, MD;  Location: [facility];  Service: Endoscopy;  Laterality: N/A;   EXCISION MORTON'S NEUROMA Right    foot   KNEE ARTHROSCOPY WITH LATERAL MENISECTOMY Left 02/27/2016   Procedure: KNEE ARTHROSCOPY WITH LATERAL and MEDIAL MENISCECTOMY, left;  Surgeon: Salvatore Marvel, MD;  Location:  SURGERY CENTER;  Service: Orthopedics;  Laterality: Left;   LAPAROSCOPIC BILATERAL SALPINGO OOPHERECTOMY Bilateral 07/06/2018   Procedure: LAPAROSCOPIC BILATERAL SALPINGO OOPHORECTOMY;  Surgeon: Ward, Elenora Fender, MD;  Location: [facility];  Service: Gynecology;  Laterality: Bilateral;   LAPAROSCOPIC HYSTERECTOMY N/A 07/06/2018   Procedure: HYSTERECTOMY TOTAL LAPAROSCOPIC;  Surgeon: Ward, Elenora Fender, MD;  Location: [facility];  Service: Gynecology;  Laterality: N/A;   REDUCTION MAMMAPLASTY Bilateral    SAVORY DILATION N/A 04/01/2019   Procedure: SAVORY DILATION;  Surgeon: Carman Ching, MD;  Location: [facility];  Service: Endoscopy;  Laterality: N/A;   SKIN CANCER EXCISION     Patient Active Problem List   Diagnosis Date Noted   Hypertension    GERD (gastroesophageal reflux disease)    Family history of adverse reaction to anesthesia    Medial meniscus tear 01/30/2016   Lateral meniscal tear left knee 01/30/2016   Palpitations 07/31/2015   Ventricular premature beats 07/31/2015   Tachycardia, unspecified 07/31/2015    PCP: Margaretann Loveless, MD - [facility]  Internal Medicine REFERRING PROVIDER: Ramond Marrow, MD  ONSET DATE: 01/15/23  REFERRING DIAG: R Reverse Total Shoulder  THERAPY DIAG:  Acute pain of right shoulder  Stiffness of right shoulder, not elsewhere classified  Other symptoms and signs involving the musculoskeletal system  Rationale for Evaluation and Treatment: Rehabilitation  SUBJECTIVE:   SUBJECTIVE STATEMENT: "Today has been better than yesterday" Pt accompanied by: self  PERTINENT HISTORY: Pt had a fall 2 years ago resulting in torn ligaments and increased pain/lack of mobility.   PRECAUTIONS:  Shoulder  WEIGHT BEARING RESTRICTIONS: Yes >1lb  PAIN:  Are you having pain? Yes: NPRS scale: 4/10 Pain location: anterior shoulder girdle Pain description: aching and tender Aggravating factors: nothing Relieving factors: Pain Medications  FALLS: Has patient fallen in last 6 months? Yes. Number of falls 1  PLOF: Independent  PATIENT GOALS: "I want my arm to be back at 100%."  NEXT MD VISIT: 01/23/23  OBJECTIVE:   HAND DOMINANCE: Right  ADLs: Overall ADLs: Pt is unable to dress or bath independently, requiring mod to max assist due to weakness, pain, and limitations of the RUE. Additionally at this time she is requiring total assist with all cooking, cleaning, and yard work.   FUNCTIONAL OUTCOME MEASURES: FOTO: Will complete next session  UPPER EXTREMITY ROM:       Assessed in supine, er/IR adducted  Passive ROM Right eval  Shoulder flexion 120  Shoulder abduction 87  Shoulder internal rotation 90  Shoulder external rotation 17  (Blank rows = not tested)    UPPER EXTREMITY MMT:     Assessed in seated, er/IR adducted  MMT Right eval  Shoulder flexion   Shoulder abduction   Shoulder internal rotation   Shoulder external rotation   Middle trapezius   Lower trapezius   (Blank rows = not tested)  SENSATION: Pt reports that her thumb is numb  EDEMA: Swelling noted in the elbow  OBSERVATIONS: mod to severe fascial restrictions along the biceps, deltoid, scapularis, and pectoralis   TODAY'S TREATMENT:                                                                                                                              DATE:   01/20/23 -Manual Therapy: myofascial release and trigger point applied to biceps, deltoid, scapularis, trapezius, in order to reduce pain and facial restrictions, as well as improving ROM.  -P/ROM: flexion, abduction, horizontal abduction, er/IR, x10 -Ball Rolls: flexion, abduction, x10 -Pendulums 2x30" -Elbow and Wrist A/ROM:  x10   PATIENT EDUCATION: Education details: Table Slides Person educated: Patient Education method: Explanation and Demonstration Education comprehension: verbalized understanding and returned demonstration  HOME EXERCISE PROGRAM: 7/19: Elbow, wrist, and hand ROM 7/22: Table Slides  GOALS: Goals reviewed with patient? Yes   SHORT TERM GOALS: Target date: 02/08/23  Pt will be provided with and educated on HEP to improve mobility in RUE required for use during ADL completion.   Goal status: IN  PROGRESS  2.  Pt will increase RUE P/ROM by 40 degrees to improve ability to use RUE during dressing tasks with minimal compensatory techniques.   Goal status: IN PROGRESS  3.  Pt will increase RUE strength to 3+/5 to improve ability to reach for items at waist to chest height during bathing and grooming tasks.   Goal status: IN PROGRESS   LONG TERM GOALS: Target date: 03/17/23  Pt will decrease pain in RUE to 3/10 or less to improve ability to sleep for 2+ consecutive hours without waking due to pain.   Goal status: IN PROGRESS  2.  Pt will decrease RUE fascial restrictions to min amounts or less to improve mobility required for functional reaching tasks.   Goal status: IN PROGRESS  3.  Pt will increase RUE A/ROM by 50 degrees to improve ability to use RUE when reaching overhead or behind back during dressing and bathing tasks.   Goal status: IN PROGRESS  4.  Pt will increase RUE strength to 4+/5 or greater to improve ability to use RUE when lifting or carrying items during meal preparation/housework/yardwork tasks.   Goal status: IN PROGRESS  5.  Pt will return to highest level of function using RUE as dominant during functional task completion.   Goal status: IN PROGRESS   ASSESSMENT:  CLINICAL IMPRESSION: This session, pt was seen for her first OT session, following phase 1 of her protocol. She had increased sharp pain with external rotation once hitting, -10 degrees.  Did well with stretching out during ball rolls, and maintained less than 5/10 pain throughout session. OT providing hands on therapy for most of the session and verbal/tactile cuing for positioning and technique with hands off tasks.   PERFORMANCE DEFICITS: in functional skills including in functional skills including ADLs, IADLs, coordination, tone, ROM, strength, pain, fascial restrictions, muscle spasms, and UE functional use.   PLAN:  OT FREQUENCY: 2x/week  OT DURATION: 8 weeks  PLANNED INTERVENTIONS: self care/ADL training, therapeutic exercise, therapeutic activity, neuromuscular re-education, manual therapy, passive range of motion, splinting, electrical stimulation, ultrasound, moist heat, cryotherapy, patient/family education, and DME and/or AE instructions  RECOMMENDED OTHER SERVICES: N/A  CONSULTED AND AGREED WITH PLAN OF CARE: Patient  PLAN FOR NEXT SESSION: Manual Therapy, P/ROM, Table Slides, Pendulums, Thumb Tacs  Trish Mage, OTR/L [facility] [phone] Kennyth Arnold, OT 01/20/2023, 12:35 PM

## 2023-01-20 NOTE — Patient Instructions (Signed)

## 2023-01-23 ENCOUNTER — Encounter (HOSPITAL_COMMUNITY): Payer: Self-pay | Admitting: Occupational Therapy

## 2023-01-23 ENCOUNTER — Ambulatory Visit (HOSPITAL_COMMUNITY): Payer: Medicare Other | Admitting: Occupational Therapy

## 2023-01-23 DIAGNOSIS — R29898 Other symptoms and signs involving the musculoskeletal system: Secondary | ICD-10-CM

## 2023-01-23 DIAGNOSIS — M25611 Stiffness of right shoulder, not elsewhere classified: Secondary | ICD-10-CM | POA: Diagnosis not present

## 2023-01-23 DIAGNOSIS — M19011 Primary osteoarthritis, right shoulder: Secondary | ICD-10-CM | POA: Diagnosis not present

## 2023-01-23 DIAGNOSIS — M25511 Pain in right shoulder: Secondary | ICD-10-CM | POA: Diagnosis not present

## 2023-01-23 NOTE — Therapy (Signed)
OUTPATIENT OCCUPATIONAL THERAPY ORTHO TREATMENT  Patient Name: Nicole Newman MRN: [id number] DOB:[birth year], 72 y.o., female Today's Date: 01/23/2023   END OF SESSION:  OT End of Session - 01/23/23 1100     Visit Number 3    Number of Visits 17    Date for OT Re-Evaluation 03/14/23   mini reassessment (02/08/23)   Authorization Type [employer] Medicare    Progress Note Due on Visit 10    OT Start Time [phone]    OT Stop Time 1029    OT Time Calculation (min) 43 min    Activity Tolerance Patient tolerated treatment well    Behavior During Therapy [facility] for tasks assessed/performed              Past Medical History:  Diagnosis Date   Anemia    Arthritis    Depression    Dysrhythmia    PVC'S 2017   Family history of adverse reaction to anesthesia    states mother had small MI during anesthesia   GERD (gastroesophageal reflux disease)    High cholesterol    Hypertension    states under control with med., has been on med. x 2.5 yr.   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   BREAST REDUCTION SURGERY     CHOLECYSTECTOMY N/A 05/06/2022   Procedure: LAPAROSCOPIC CHOLECYSTECTOMY;  Surgeon: Fritzi Mandes, MD;  Location: [facility];  Service: General;  Laterality: N/A;   CHONDROPLASTY Left 02/27/2016   Procedure: CHONDROPLASTY;  Surgeon: Salvatore Marvel, MD;  Location: [facility];  Service: Orthopedics;  Laterality: Left;   DIAGNOSTIC LAPAROSCOPY     ESOPHAGEAL DILATION     ESOPHAGOGASTRODUODENOSCOPY (EGD) WITH PROPOFOL N/A 04/01/2019   Procedure: ESOPHAGOGASTRODUODENOSCOPY (EGD) WITH PROPOFOL;  Surgeon: Carman Ching, MD;  Location: [facility];  Service: Endoscopy;  Laterality: N/A;   EXCISION MORTON'S NEUROMA Right    foot   KNEE ARTHROSCOPY WITH LATERAL MENISECTOMY Left 02/27/2016   Procedure: KNEE ARTHROSCOPY WITH LATERAL and MEDIAL MENISCECTOMY, left;  Surgeon: Salvatore Marvel, MD;  Location:  SURGERY CENTER;  Service: Orthopedics;  Laterality: Left;   LAPAROSCOPIC BILATERAL SALPINGO OOPHERECTOMY Bilateral 07/06/2018   Procedure: LAPAROSCOPIC BILATERAL SALPINGO OOPHORECTOMY;  Surgeon: Ward, Elenora Fender, MD;  Location: [facility];  Service: Gynecology;  Laterality: Bilateral;   LAPAROSCOPIC HYSTERECTOMY N/A 07/06/2018   Procedure: HYSTERECTOMY TOTAL LAPAROSCOPIC;  Surgeon: Ward, Elenora Fender, MD;  Location: [facility];  Service: Gynecology;  Laterality: N/A;   REDUCTION MAMMAPLASTY Bilateral    SAVORY DILATION N/A 04/01/2019   Procedure: SAVORY DILATION;  Surgeon: Carman Ching, MD;  Location: [facility];  Service: Endoscopy;  Laterality: N/A;   SKIN CANCER EXCISION     Patient Active Problem List   Diagnosis Date Noted   Hypertension    GERD (gastroesophageal reflux disease)    Family history of adverse reaction to anesthesia    Medial meniscus tear 01/30/2016   Lateral meniscal tear left knee 01/30/2016   Palpitations 07/31/2015   Ventricular premature beats 07/31/2015   Tachycardia, unspecified 07/31/2015    PCP: Margaretann Loveless, MD -  [facility] REFERRING PROVIDER: Ramond Marrow, MD  ONSET DATE: 01/15/23  REFERRING DIAG: R Reverse Total Shoulder  THERAPY DIAG:  Acute pain of right shoulder  Stiffness of right shoulder, not elsewhere classified  Other symptoms and signs involving the musculoskeletal system  Rationale for Evaluation and Treatment: Rehabilitation  SUBJECTIVE:   SUBJECTIVE STATEMENT: "The celebrex has been making me sick, so I've stopped taking it." Pt accompanied by: self  PERTINENT HISTORY: Pt had a fall 2 years ago resulting in torn ligaments and increased pain/lack of  mobility.   PRECAUTIONS: Shoulder  WEIGHT BEARING RESTRICTIONS: Yes >1lb  PAIN:  Are you having pain? Yes: NPRS scale: 5/10 Pain location: anterior shoulder girdle Pain description: aching and tender Aggravating factors: nothing Relieving factors: Pain Medications  FALLS: Has patient fallen in last 6 months? Yes. Number of falls 1  PLOF: Independent  PATIENT GOALS: "I want my arm to be back at 100%."  NEXT MD VISIT: 01/23/23  OBJECTIVE:   HAND DOMINANCE: Right  ADLs: Overall ADLs: Pt is unable to dress or bath independently, requiring mod to max assist due to weakness, pain, and limitations of the RUE. Additionally at this time she is requiring total assist with all cooking, cleaning, and yard work.   FUNCTIONAL OUTCOME MEASURES: FOTO: Will complete next session  UPPER EXTREMITY ROM:       Assessed in supine, er/IR adducted  Passive ROM Right eval  Shoulder flexion 120  Shoulder abduction 87  Shoulder internal rotation 90  Shoulder external rotation 17  (Blank rows = not tested)    UPPER EXTREMITY MMT:     Assessed in seated, er/IR adducted  MMT Right eval  Shoulder flexion   Shoulder abduction   Shoulder internal rotation   Shoulder external rotation   Middle trapezius   Lower trapezius   (Blank rows = not tested)  SENSATION: Pt reports that her thumb is numb  EDEMA: Swelling noted in the elbow  OBSERVATIONS: mod to severe fascial restrictions along the biceps, deltoid, scapularis, and pectoralis   TODAY'S TREATMENT:                                                                                                                              DATE:   01/23/23 -Manual Therapy: myofascial release and trigger point applied to biceps, deltoid, scapularis, trapezius, in order to reduce pain and facial restrictions, as well as improving ROM.  -P/ROM: flexion, abduction, horizontal abduction, er/IR, x10 -Low Level Wall Wash, 2x60" -Thumb tacs:  2x60" -Ball Rolls: flexion, abduction, x10  01/20/23 -Manual Therapy: myofascial release and trigger point applied to biceps, deltoid, scapularis, trapezius, in order to reduce pain and facial restrictions, as well as improving ROM.  -P/ROM: flexion, abduction, horizontal abduction, er/IR, x10 -Ball Rolls: flexion, abduction, x10 -Pendulums 2x30" -Elbow and Wrist A/ROM: x10   PATIENT EDUCATION: Education details: Continue HEP Person educated: Patient Education method: Medical illustrator Education comprehension: verbalized understanding and  returned demonstration  HOME EXERCISE PROGRAM: 7/19: Elbow, wrist, and hand ROM 7/22: Table Slides  GOALS: Goals reviewed with patient? Yes   SHORT TERM GOALS: Target date: 02/08/23  Pt will be provided with and educated on HEP to improve mobility in RUE required for use during ADL completion.   Goal status: IN PROGRESS  2.  Pt will increase RUE P/ROM by 40 degrees to improve ability to use RUE during dressing tasks with minimal compensatory techniques.   Goal status: IN PROGRESS  3.  Pt will increase RUE strength to 3+/5 to improve ability to reach for items at waist to chest height during bathing and grooming tasks.   Goal status: IN PROGRESS   LONG TERM GOALS: Target date: 03/17/23  Pt will decrease pain in RUE to 3/10 or less to improve ability to sleep for 2+ consecutive hours without waking due to pain.   Goal status: IN PROGRESS  2.  Pt will decrease RUE fascial restrictions to min amounts or less to improve mobility required for functional reaching tasks.   Goal status: IN PROGRESS  3.  Pt will increase RUE A/ROM by 50 degrees to improve ability to use RUE when reaching overhead or behind back during dressing and bathing tasks.   Goal status: IN PROGRESS  4.  Pt will increase RUE strength to 4+/5 or greater to improve ability to use RUE when lifting or carrying items during meal preparation/housework/yardwork  tasks.   Goal status: IN PROGRESS  5.  Pt will return to highest level of function using RUE as dominant during functional task completion.   Goal status: IN PROGRESS   ASSESSMENT:  CLINICAL IMPRESSION: Pt continuing to work on phase 1 of her protocol. With flexion and abduction, she is reaching with in 5-10 degrees of the maximum ROM following the protocol, with moderate pain. She was able to achieve neutral positioning for er/IR this session with mild to moderate pain. Pt reports completing her HEP 3+ times a day and stretching into them as best as able. OT providing verbal and tactile cuing for positioning and technique.   PERFORMANCE DEFICITS: in functional skills including in functional skills including ADLs, IADLs, coordination, tone, ROM, strength, pain, fascial restrictions, muscle spasms, and UE functional use.   PLAN:  OT FREQUENCY: 2x/week  OT DURATION: 8 weeks  PLANNED INTERVENTIONS: self care/ADL training, therapeutic exercise, therapeutic activity, neuromuscular re-education, manual therapy, passive range of motion, splinting, electrical stimulation, ultrasound, moist heat, cryotherapy, patient/family education, and DME and/or AE instructions  RECOMMENDED OTHER SERVICES: N/A  CONSULTED AND AGREED WITH PLAN OF CARE: Patient  PLAN FOR NEXT SESSION: Manual Therapy, P/ROM, Table Slides, Pendulums, Thumb Tacs  Trish Mage, OTR/L [facility] [phone] Yolande Skoda Rosemarie Beath, OT 01/23/2023, 11:00 AM

## 2023-01-28 ENCOUNTER — Ambulatory Visit (HOSPITAL_COMMUNITY): Payer: Medicare Other | Admitting: Occupational Therapy

## 2023-01-28 ENCOUNTER — Encounter (HOSPITAL_COMMUNITY): Payer: Self-pay | Admitting: Occupational Therapy

## 2023-01-28 DIAGNOSIS — R29898 Other symptoms and signs involving the musculoskeletal system: Secondary | ICD-10-CM

## 2023-01-28 DIAGNOSIS — M25611 Stiffness of right shoulder, not elsewhere classified: Secondary | ICD-10-CM | POA: Diagnosis not present

## 2023-01-28 DIAGNOSIS — M25511 Pain in right shoulder: Secondary | ICD-10-CM

## 2023-01-28 NOTE — Therapy (Signed)
OUTPATIENT OCCUPATIONAL THERAPY ORTHO TREATMENT  Patient Name: IDELLAR STOCKS MRN: [id number] DOB:04/07/1951, 72 y.o., female Today's Date: 01/28/2023   END OF SESSION:  OT End of Session - 01/28/23 0953     Visit Number 4    Number of Visits 17    Date for OT Re-Evaluation 03/14/23   mini reassessment (02/08/23)   Authorization Type BCBS Medicare    Progress Note Due on Visit 10    OT Start Time 0906    OT Stop Time [phone]    OT Time Calculation (min) 41 min    Activity Tolerance Patient tolerated treatment well    Behavior During Therapy [facility] for tasks assessed/performed             Past Medical History:  Diagnosis Date   Anemia    Arthritis    Depression    Dysrhythmia    PVC'S 2017   Family history of adverse reaction to anesthesia    states mother had small MI during anesthesia   GERD (gastroesophageal reflux disease)    High cholesterol    Hypertension    states under control with med., has been on med. x 2.5 yr.   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   BREAST REDUCTION SURGERY     CHOLECYSTECTOMY N/A 05/06/2022   Procedure: LAPAROSCOPIC CHOLECYSTECTOMY;  Surgeon: Fritzi Mandes, MD;  Location: [facility];  Service: General;  Laterality: N/A;   CHONDROPLASTY Left 02/27/2016   Procedure: CHONDROPLASTY;  Surgeon: Salvatore Marvel, MD;  Location: [facility];  Service: Orthopedics;  Laterality: Left;   DIAGNOSTIC LAPAROSCOPY     ESOPHAGEAL DILATION     ESOPHAGOGASTRODUODENOSCOPY (EGD) WITH PROPOFOL N/A 04/01/2019   Procedure: ESOPHAGOGASTRODUODENOSCOPY (EGD) WITH PROPOFOL;  Surgeon: Carman Ching, MD;  Location: [facility];  Service: Endoscopy;  Laterality: N/A;   EXCISION MORTON'S NEUROMA Right    foot   KNEE ARTHROSCOPY WITH LATERAL MENISECTOMY Left 02/27/2016   Procedure: KNEE ARTHROSCOPY WITH LATERAL and MEDIAL MENISCECTOMY, left;  Surgeon: Salvatore Marvel, MD;  Location: [facility];  Service: Orthopedics;  Laterality: Left;   LAPAROSCOPIC BILATERAL SALPINGO OOPHERECTOMY Bilateral 07/06/2018   Procedure: LAPAROSCOPIC BILATERAL SALPINGO OOPHORECTOMY;  Surgeon: Ward, Elenora Fender, MD;  Location: [facility];  Service: Gynecology;  Laterality: Bilateral;   LAPAROSCOPIC HYSTERECTOMY N/A 07/06/2018   Procedure: HYSTERECTOMY TOTAL LAPAROSCOPIC;  Surgeon: Ward, Elenora Fender, MD;  Location: [facility];  Service: Gynecology;  Laterality: N/A;   REDUCTION MAMMAPLASTY Bilateral    SAVORY DILATION N/A 04/01/2019   Procedure: SAVORY DILATION;  Surgeon: Carman Ching, MD;  Location: [facility];  Service: Endoscopy;  Laterality: N/A;   SKIN CANCER EXCISION     Patient Active Problem List   Diagnosis Date Noted   Hypertension    GERD (gastroesophageal reflux disease)    Family history of adverse reaction to anesthesia    Medial meniscus tear 01/30/2016   Lateral meniscal tear left knee 01/30/2016   Palpitations 07/31/2015   Ventricular premature beats 07/31/2015   Tachycardia, unspecified 07/31/2015    PCP: Margaretann Loveless, MD - [facility]  Internal Medicine REFERRING PROVIDER: Ramond Marrow, MD  ONSET DATE: 01/15/23  REFERRING DIAG: R Reverse Total Shoulder  THERAPY DIAG:  Acute pain of right shoulder  Stiffness of right shoulder, not elsewhere classified  Other symptoms and signs involving the musculoskeletal system  Rationale for Evaluation and Treatment: Rehabilitation  SUBJECTIVE:   SUBJECTIVE STATEMENT: "I think it's really getting better." Pt accompanied by: self  PERTINENT HISTORY: Pt had a fall 2 years ago resulting in torn ligaments and increased pain/lack of mobility.   PRECAUTIONS:  Shoulder  WEIGHT BEARING RESTRICTIONS: Yes >1lb  PAIN:  Are you having pain? Yes: NPRS scale: 2/10 Pain location: anterior shoulder girdle Pain description: aching and tender Aggravating factors: nothing Relieving factors: Pain Medications  FALLS: Has patient fallen in last 6 months? Yes. Number of falls 1  PLOF: Independent  PATIENT GOALS: "I want my arm to be back at 100%."  NEXT MD VISIT: 01/23/23  OBJECTIVE:   HAND DOMINANCE: Right  ADLs: Overall ADLs: Pt is unable to dress or bath independently, requiring mod to max assist due to weakness, pain, and limitations of the RUE. Additionally at this time she is requiring total assist with all cooking, cleaning, and yard work.   FUNCTIONAL OUTCOME MEASURES: FOTO: Will complete next session  UPPER EXTREMITY ROM:       Assessed in supine, er/IR adducted  Passive ROM Right eval  Shoulder flexion 120  Shoulder abduction 87  Shoulder internal rotation 90  Shoulder external rotation 17  (Blank rows = not tested)    UPPER EXTREMITY MMT:     Assessed in seated, er/IR adducted  MMT Right eval  Shoulder flexion   Shoulder abduction   Shoulder internal rotation   Shoulder external rotation   Middle trapezius   Lower trapezius   (Blank rows = not tested)  SENSATION: Pt reports that her thumb is numb  EDEMA: Swelling noted in the elbow  OBSERVATIONS: mod to severe fascial restrictions along the biceps, deltoid, scapularis, and pectoralis   TODAY'S TREATMENT:                                                                                                                              DATE:   01/28/23 -Manual Therapy: myofascial release and trigger point applied to biceps, deltoid, scapularis, trapezius, in order to reduce pain and facial restrictions, as well as improving ROM.  -P/ROM: flexion, abduction, horizontal abduction, er/IR, x10, 5-8" holds in end range -Low Level Wall Wash, 2x60" -Thumb tacs:  2x60" -Pendulums 2x30"  01/23/23 -Manual Therapy: myofascial release and trigger point applied to biceps, deltoid, scapularis, trapezius, in order to reduce pain and facial restrictions, as well as improving ROM.  -P/ROM: flexion, abduction, horizontal abduction, er/IR, x10 -Low Level Wall Wash, 2x60" -Thumb tacs: 2x60" -Ball Rolls: flexion, abduction, x10  01/20/23 -Manual Therapy: myofascial release and trigger point applied to biceps, deltoid, scapularis, trapezius, in order to reduce pain and facial restrictions, as well  as improving ROM.  -P/ROM: flexion, abduction, horizontal abduction, er/IR, x10 -Ball Rolls: flexion, abduction, x10 -Pendulums 2x30" -Elbow and Wrist A/ROM: x10   PATIENT EDUCATION: Education details: Continue HEP Person educated: Patient Education method: Medical illustrator Education comprehension: verbalized understanding and returned demonstration  HOME EXERCISE PROGRAM: 7/19: Elbow, wrist, and hand ROM 7/22: Table Slides  GOALS: Goals reviewed with patient? Yes   SHORT TERM GOALS: Target date: 02/08/23  Pt will be provided with and educated on HEP to improve mobility in RUE required for use during ADL completion.   Goal status: IN PROGRESS  2.  Pt will increase RUE P/ROM by 40 degrees to improve ability to use RUE during dressing tasks with minimal compensatory techniques.   Goal status: IN PROGRESS  3.  Pt will increase RUE strength to 3+/5 to improve ability to reach for items at waist to chest height during bathing and grooming tasks.   Goal status: IN PROGRESS   LONG TERM GOALS: Target date: 03/17/23  Pt will decrease pain in RUE to 3/10 or less to improve ability to sleep for 2+ consecutive hours without waking due to pain.   Goal status: IN PROGRESS  2.  Pt will decrease RUE fascial restrictions to min amounts or less to improve mobility required for functional reaching tasks.   Goal status: IN PROGRESS  3.  Pt will  increase RUE A/ROM by 50 degrees to improve ability to use RUE when reaching overhead or behind back during dressing and bathing tasks.   Goal status: IN PROGRESS  4.  Pt will increase RUE strength to 4+/5 or greater to improve ability to use RUE when lifting or carrying items during meal preparation/housework/yardwork tasks.   Goal status: IN PROGRESS  5.  Pt will return to highest level of function using RUE as dominant during functional task completion.   Goal status: IN PROGRESS   ASSESSMENT:  CLINICAL IMPRESSION: This session, pt demonstrating decreased pain and restrictions, allowing for further passive ROM. She is reaching all degrees outlined by the protocol in flexion, abduction, and external rotation. OT added stretching in the end ranges to work on decreasing pain in the end ranges. Verbal and visual cuing provided throughout session for positioning and technique.   PERFORMANCE DEFICITS: in functional skills including in functional skills including ADLs, IADLs, coordination, tone, ROM, strength, pain, fascial restrictions, muscle spasms, and UE functional use.   PLAN:  OT FREQUENCY: 2x/week  OT DURATION: 8 weeks  PLANNED INTERVENTIONS: self care/ADL training, therapeutic exercise, therapeutic activity, neuromuscular re-education, manual therapy, passive range of motion, splinting, electrical stimulation, ultrasound, moist heat, cryotherapy, patient/family education, and DME and/or AE instructions  RECOMMENDED OTHER SERVICES: N/A  CONSULTED AND AGREED WITH PLAN OF CARE: Patient  PLAN FOR NEXT SESSION: Manual Therapy, P/ROM, Table Slides, Pendulums, Thumb Tacs  Trish Mage, OTR/L [facility] [phone] Kennyth Arnold, OT 01/28/2023, 9:53 AM

## 2023-01-30 ENCOUNTER — Encounter (HOSPITAL_COMMUNITY): Payer: Self-pay | Admitting: Occupational Therapy

## 2023-01-30 ENCOUNTER — Ambulatory Visit (HOSPITAL_COMMUNITY): Payer: Medicare Other | Attending: Physician Assistant | Admitting: Occupational Therapy

## 2023-01-30 DIAGNOSIS — R29898 Other symptoms and signs involving the musculoskeletal system: Secondary | ICD-10-CM | POA: Insufficient documentation

## 2023-01-30 DIAGNOSIS — M25611 Stiffness of right shoulder, not elsewhere classified: Secondary | ICD-10-CM | POA: Insufficient documentation

## 2023-01-30 DIAGNOSIS — M25511 Pain in right shoulder: Secondary | ICD-10-CM | POA: Diagnosis not present

## 2023-01-30 NOTE — Therapy (Signed)
OUTPATIENT OCCUPATIONAL THERAPY ORTHO TREATMENT  Patient Name: Nicole Newman MRN: [id number] DOB:06-04-1951, 72 y.o., female Today's Date: 01/31/2023   END OF SESSION:  OT End of Session - 01/30/23 0945     Visit Number 5    Number of Visits 17    Date for OT Re-Evaluation 03/14/23   mini reassessment (02/08/23)   Authorization Type [employer]BS Medicare    Progress Note Due on Visit 10    OT Start Time 0908    OT Stop Time [phone]    OT Time Calculation (min) 40 min    Activity Tolerance Patient tolerated treatment well    Behavior During Therapy [facility] for tasks assessed/performed              Past Medical History:  Diagnosis Date   Anemia    Arthritis    Depression    Dysrhythmia    PVC'S 2017   Family history of adverse reaction to anesthesia    states mother had small MI during anesthesia   GERD (gastroesophageal reflux disease)    High cholesterol    Hypertension    states under control with med., has been on med. x 2.5 yr.   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   BREAST REDUCTION SURGERY     CHOLECYSTECTOMY N/A 05/06/2022   Procedure: LAPAROSCOPIC CHOLECYSTECTOMY;  Surgeon: Fritzi Mandes, MD;  Location: [facility];  Service: General;  Laterality: N/A;   CHONDROPLASTY Left 02/27/2016   Procedure: CHONDROPLASTY;  Surgeon: Salvatore Marvel, MD;  Location: [facility];  Service: Orthopedics;  Laterality: Left;   DIAGNOSTIC LAPAROSCOPY     ESOPHAGEAL DILATION     ESOPHAGOGASTRODUODENOSCOPY (EGD) WITH PROPOFOL N/A 04/01/2019   Procedure: ESOPHAGOGASTRODUODENOSCOPY (EGD) WITH PROPOFOL;  Surgeon: Carman Ching, MD;  Location: [facility];  Service: Endoscopy;  Laterality: N/A;   EXCISION MORTON'S NEUROMA Right    foot   KNEE ARTHROSCOPY WITH LATERAL MENISECTOMY Left 02/27/2016   Procedure: KNEE ARTHROSCOPY WITH LATERAL and MEDIAL MENISCECTOMY, left;  Surgeon: Salvatore Marvel, MD;  Location: [facility];  Service: Orthopedics;  Laterality: Left;   LAPAROSCOPIC BILATERAL SALPINGO OOPHERECTOMY Bilateral 07/06/2018   Procedure: LAPAROSCOPIC BILATERAL SALPINGO OOPHORECTOMY;  Surgeon: Ward, Elenora Fender, MD;  Location: [facility];  Service: Gynecology;  Laterality: Bilateral;   LAPAROSCOPIC HYSTERECTOMY N/A 07/06/2018   Procedure: HYSTERECTOMY TOTAL LAPAROSCOPIC;  Surgeon: Ward, Elenora Fender, MD;  Location: [facility];  Service: Gynecology;  Laterality: N/A;   REDUCTION MAMMAPLASTY Bilateral    SAVORY DILATION N/A 04/01/2019   Procedure: SAVORY DILATION;  Surgeon: Carman Ching, MD;  Location: [facility];  Service: Endoscopy;  Laterality: N/A;   SKIN CANCER EXCISION     Patient Active Problem List   Diagnosis Date Noted   Hypertension    GERD (gastroesophageal reflux disease)    Family history of adverse reaction to anesthesia    Medial meniscus tear 01/30/2016   Lateral meniscal tear left knee 01/30/2016   Palpitations 07/31/2015   Ventricular premature beats 07/31/2015   Tachycardia, unspecified 07/31/2015    PCP: Margaretann Loveless, MD -  [facility] REFERRING PROVIDER: Ramond Marrow, MD  ONSET DATE: 01/15/23  REFERRING DIAG: R Reverse Total Shoulder  THERAPY DIAG:  Acute pain of right shoulder  Stiffness of right shoulder, not elsewhere classified  Other symptoms and signs involving the musculoskeletal system  Rationale for Evaluation and Treatment: Rehabilitation  SUBJECTIVE:   SUBJECTIVE STATEMENT: "I think it's really getting better." Pt accompanied by: self  PERTINENT HISTORY: Pt had a fall 2 years ago resulting in torn ligaments and increased pain/lack of mobility.   PRECAUTIONS:  Shoulder  WEIGHT BEARING RESTRICTIONS: Yes >1lb  PAIN:  Are you having pain? Yes: NPRS scale: 2/10 Pain location: anterior shoulder girdle Pain description: aching and tender Aggravating factors: nothing Relieving factors: Pain Medications  FALLS: Has patient fallen in last 6 months? Yes. Number of falls 1  PLOF: Independent  PATIENT GOALS: "I want my arm to be back at 100%."  NEXT MD VISIT: 01/23/23  OBJECTIVE:   HAND DOMINANCE: Right  ADLs: Overall ADLs: Pt is unable to dress or bath independently, requiring mod to max assist due to weakness, pain, and limitations of the RUE. Additionally at this time she is requiring total assist with all cooking, cleaning, and yard work.   FUNCTIONAL OUTCOME MEASURES: FOTO: Will complete next session  UPPER EXTREMITY ROM:       Assessed in supine, er/IR adducted  Passive ROM Right eval  Shoulder flexion 120  Shoulder abduction 87  Shoulder internal rotation 90  Shoulder external rotation 17  (Blank rows = not tested)    UPPER EXTREMITY MMT:     Assessed in seated, er/IR adducted  MMT Right eval  Shoulder flexion   Shoulder abduction   Shoulder internal rotation   Shoulder external rotation   Middle trapezius   Lower trapezius   (Blank rows = not tested)  SENSATION: Pt reports that her thumb is numb  EDEMA: Swelling noted in the elbow  OBSERVATIONS: mod to severe fascial restrictions along the biceps, deltoid, scapularis, and pectoralis   TODAY'S TREATMENT:                                                                                                                              DATE:   01/30/23 -Manual Therapy: myofascial release and trigger point applied to biceps, deltoid, scapularis, trapezius, in order to reduce pain and facial restrictions, as well as improving ROM.  -P/ROM: flexion, abduction, horizontal abduction, er/IR, x10, 5-8" holds in end range -[facility]: flexion, abduction, 2x15 -elbow ROM  x10 -Thumb tacs: 2x60" -Low Level Wall Wash, 2x60"  01/28/23 -Manual Therapy: myofascial release and trigger point applied to biceps, deltoid, scapularis, trapezius, in order to reduce pain and facial restrictions, as well as improving ROM.  -P/ROM: flexion, abduction, horizontal abduction, er/IR, x10, 5-8" holds in end range -Low Level Wall Wash, 2x60" -Thumb tacs: 2x60" -Pendulums 2x30"  01/23/23 -Manual Therapy: myofascial release and trigger point applied to biceps, deltoid, scapularis, trapezius, in  order to reduce pain and facial restrictions, as well as improving ROM.  -P/ROM: flexion, abduction, horizontal abduction, er/IR, x10 -Low Level Wall Wash, 2x60" -Thumb tacs: 2x60" -Ball Rolls: flexion, abduction, x10   PATIENT EDUCATION: Education details: Continue HEP Person educated: Patient Education method: Medical illustrator Education comprehension: verbalized understanding and returned demonstration  HOME EXERCISE PROGRAM: 7/19: Elbow, wrist, and hand ROM 7/22: Table Slides  GOALS: Goals reviewed with patient? Yes   SHORT TERM GOALS: Target date: 02/08/23  Pt will be provided with and educated on HEP to improve mobility in RUE required for use during ADL completion.   Goal status: IN PROGRESS  2.  Pt will increase RUE P/ROM by 40 degrees to improve ability to use RUE during dressing tasks with minimal compensatory techniques.   Goal status: IN PROGRESS  3.  Pt will increase RUE strength to 3+/5 to improve ability to reach for items at waist to chest height during bathing and grooming tasks.   Goal status: IN PROGRESS   LONG TERM GOALS: Target date: 03/17/23  Pt will decrease pain in RUE to 3/10 or less to improve ability to sleep for 2+ consecutive hours without waking due to pain.   Goal status: IN PROGRESS  2.  Pt will decrease RUE fascial restrictions to min amounts or less to improve mobility required for functional reaching tasks.   Goal  status: IN PROGRESS  3.  Pt will increase RUE A/ROM by 50 degrees to improve ability to use RUE when reaching overhead or behind back during dressing and bathing tasks.   Goal status: IN PROGRESS  4.  Pt will increase RUE strength to 4+/5 or greater to improve ability to use RUE when lifting or carrying items during meal preparation/housework/yardwork tasks.   Goal status: IN PROGRESS  5.  Pt will return to highest level of function using RUE as dominant during functional task completion.   Goal status: IN PROGRESS   ASSESSMENT:  CLINICAL IMPRESSION: Pt continuing to demonstrate improving ROM passively. She is able to complete all HEP multiple times daily with no difficulty. Pt is still following phase 1 of her protocol, reaching 120 degrees in flexion, 90-100 degrees in abduction, and 10 degrees of external rotation. With external rotation, pt having increased pain along her elbow, which only occurs once reaching neutral and dissipates when returning to internal rotation. OT providing verbal and tactile cuing throughout for positioning and technique.   PERFORMANCE DEFICITS: in functional skills including in functional skills including ADLs, IADLs, coordination, tone, ROM, strength, pain, fascial restrictions, muscle spasms, and UE functional use.   PLAN:  OT FREQUENCY: 2x/week  OT DURATION: 8 weeks  PLANNED INTERVENTIONS: self care/ADL training, therapeutic exercise, therapeutic activity, neuromuscular re-education, manual therapy, passive range of motion, splinting, electrical stimulation, ultrasound, moist heat, cryotherapy, patient/family education, and DME and/or AE instructions  RECOMMENDED OTHER SERVICES: N/A  CONSULTED AND AGREED WITH PLAN OF CARE: Patient  PLAN FOR NEXT SESSION: Manual Therapy, P/ROM, Table Slides, Pendulums, Thumb Tacs  Trish Mage, OTR/L [facility] [phone] Kennyth Arnold, OT 01/31/2023, 8:37 AM

## 2023-02-04 ENCOUNTER — Ambulatory Visit (HOSPITAL_COMMUNITY): Payer: Medicare Other | Admitting: Occupational Therapy

## 2023-02-04 ENCOUNTER — Encounter (HOSPITAL_COMMUNITY): Payer: Self-pay | Admitting: Occupational Therapy

## 2023-02-04 DIAGNOSIS — R29898 Other symptoms and signs involving the musculoskeletal system: Secondary | ICD-10-CM | POA: Diagnosis not present

## 2023-02-04 DIAGNOSIS — M25511 Pain in right shoulder: Secondary | ICD-10-CM

## 2023-02-04 DIAGNOSIS — M25611 Stiffness of right shoulder, not elsewhere classified: Secondary | ICD-10-CM | POA: Diagnosis not present

## 2023-02-04 NOTE — Therapy (Signed)
OUTPATIENT OCCUPATIONAL THERAPY ORTHO TREATMENT  Patient Name: Nicole Newman MRN: [id number] DOB:1951/04/01, 72 y.o., female Today's Date: 02/04/2023   END OF SESSION:  OT End of Session - 02/04/23 1438     Visit Number 6    Number of Visits 17    Date for OT Re-Evaluation 03/14/23   mini reassessment (02/08/23)   Authorization Type [employer] Medicare    Progress Note Due on Visit 10    OT Start Time 1344    OT Stop Time 1425    OT Time Calculation (min) 41 min    Activity Tolerance Patient tolerated treatment well    Behavior During Therapy WFL for tasks assessed/performed               Past Medical History:  Diagnosis Date   Anemia    Arthritis    Depression    Dysrhythmia    PVC'S 2017   Family history of adverse reaction to anesthesia    states mother had small MI during anesthesia   GERD (gastroesophageal reflux disease)    High cholesterol    Hypertension    states under control with med., has been on med. x 2.5 yr.   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   BREAST REDUCTION SURGERY     CHOLECYSTECTOMY N/A 05/06/2022   Procedure: LAPAROSCOPIC CHOLECYSTECTOMY;  Surgeon: Fritzi Mandes, MD;  Location: [facility];  Service: General;  Laterality: N/A;   CHONDROPLASTY Left 02/27/2016   Procedure: CHONDROPLASTY;  Surgeon: Salvatore Marvel, MD;  Location: [facility];  Service: Orthopedics;  Laterality: Left;   DIAGNOSTIC LAPAROSCOPY     ESOPHAGEAL DILATION     ESOPHAGOGASTRODUODENOSCOPY (EGD) WITH PROPOFOL N/A 04/01/2019   Procedure: ESOPHAGOGASTRODUODENOSCOPY (EGD) WITH PROPOFOL;  Surgeon: Carman Ching, MD;  Location: [facility];  Service: Endoscopy;  Laterality: N/A;   EXCISION MORTON'S NEUROMA Right    foot   KNEE ARTHROSCOPY WITH LATERAL MENISECTOMY Left 02/27/2016   Procedure: KNEE ARTHROSCOPY WITH LATERAL and MEDIAL MENISCECTOMY, left;  Surgeon: Salvatore Marvel, MD;  Location: [facility];  Service: Orthopedics;  Laterality: Left;   LAPAROSCOPIC BILATERAL SALPINGO OOPHERECTOMY Bilateral 07/06/2018   Procedure: LAPAROSCOPIC BILATERAL SALPINGO OOPHORECTOMY;  Surgeon: Ward, Elenora Fender, MD;  Location: [facility];  Service: Gynecology;  Laterality: Bilateral;   LAPAROSCOPIC HYSTERECTOMY N/A 07/06/2018   Procedure: HYSTERECTOMY TOTAL LAPAROSCOPIC;  Surgeon: Ward, Elenora Fender, MD;  Location: [facility];  Service: Gynecology;  Laterality: N/A;   REDUCTION MAMMAPLASTY Bilateral    SAVORY DILATION N/A 04/01/2019   Procedure: SAVORY DILATION;  Surgeon: Carman Ching, MD;  Location: [facility];  Service: Endoscopy;  Laterality: N/A;   SKIN CANCER EXCISION     Patient Active Problem List   Diagnosis Date Noted   Hypertension    GERD (gastroesophageal reflux disease)    Family history of adverse reaction to anesthesia    Medial meniscus tear 01/30/2016   Lateral meniscal tear left knee 01/30/2016   Palpitations 07/31/2015   Ventricular premature beats 07/31/2015   Tachycardia, unspecified 07/31/2015    PCP: Margaretann Loveless, MD -  [facility] REFERRING PROVIDER: Ramond Marrow, MD  ONSET DATE: 01/15/23  REFERRING DIAG: R Reverse Total Shoulder  THERAPY DIAG:  Acute pain of right shoulder  Stiffness of right shoulder, not elsewhere classified  Other symptoms and signs involving the musculoskeletal system  Rationale for Evaluation and Treatment: Rehabilitation  SUBJECTIVE:   SUBJECTIVE STATEMENT: S: I took a shower and got myself dressed today by myself.   PERTINENT HISTORY: Pt had a fall 2 years ago resulting in torn ligaments and increased pain/lack of mobility. Pt had right reverse TSA on  01/15/23.   PRECAUTIONS: Shoulder-see protocol  WEIGHT BEARING RESTRICTIONS: Yes >1lb  PAIN:  Are you having pain? Yes: NPRS scale: 2/10 Pain location: anterior shoulder girdle Pain description: aching and tender Aggravating factors: nothing Relieving factors: Pain Medications  FALLS: Has patient fallen in last 6 months? Yes. Number of falls 1  PLOF: Independent  PATIENT GOALS: "I want my arm to be back at 100%."  NEXT MD VISIT: 02/14/23  OBJECTIVE:   HAND DOMINANCE: Right  ADLs: Overall ADLs: Pt is unable to dress or bath independently, requiring mod to max assist due to weakness, pain, and limitations of the RUE. Additionally at this time she is requiring total assist with all cooking, cleaning, and yard work.   FUNCTIONAL OUTCOME MEASURES: FOTO: Will complete next session  UPPER EXTREMITY ROM:       Assessed in supine, er/IR adducted  Passive ROM Right eval  Shoulder flexion 120  Shoulder abduction 87  Shoulder internal rotation 90  Shoulder external rotation 17  (Blank rows = not tested)    UPPER EXTREMITY MMT:     Assessed in seated, er/IR adducted  MMT Right eval  Shoulder flexion   Shoulder abduction   Shoulder internal rotation   Shoulder external rotation   Middle trapezius   Lower trapezius   (Blank rows = not tested)  SENSATION: Pt reports that her thumb is numb  EDEMA: Swelling noted in the elbow  OBSERVATIONS: mod to severe fascial restrictions along the biceps, deltoid, scapularis, and pectoralis   TODAY'S TREATMENT:                                                                                                                              DATE:  02/04/23 -Manual Therapy: myofascial release and trigger point applied to biceps, deltoid, scapularis, trapezius, in order to reduce pain and facial restrictions, as well as improving ROM.  -P/ROM: flexion, abduction, horizontal abduction, er/IR, x5 -AA/ROM: supine-protraction, flexion,  horizontal abduction, er/IR, abduction, 10 reps -Wall wash: 1' flexion -Pulleys: 1' flexion, 1' abduction  01/30/23 -Manual Therapy: myofascial release and trigger point applied to biceps, deltoid, scapularis, trapezius, in order to reduce pain and facial restrictions, as well as improving ROM.  -P/ROM: flexion, abduction, horizontal abduction, er/IR, x10, 5-8" holds in end range [facility]: flexion, abduction, 2x15 -elbow ROM x10 -Thumb tacs: 2x60" -Low Level Wall Wash, 2x60"  01/28/23 -Manual  Therapy: myofascial release and trigger point applied to biceps, deltoid, scapularis, trapezius, in order to reduce pain and facial restrictions, as well as improving ROM.  -P/ROM: flexion, abduction, horizontal abduction, er/IR, x10, 5-8" holds in end range -Low Level Wall Wash, 2x60" -Thumb tacs: 2x60" -Pendulums 2x30"   PATIENT EDUCATION: Education details: AA/ROM Person educated: Patient Education method: Medical illustrator Education comprehension: verbalized understanding and returned demonstration  HOME EXERCISE PROGRAM: 7/19: Elbow, wrist, and hand ROM 7/22: Table Slides 8/6: AA/ROM  GOALS: Goals reviewed with patient? Yes   SHORT TERM GOALS: Target date: 02/08/23  Pt will be provided with and educated on HEP to improve mobility in RUE required for use during ADL completion.   Goal status: IN PROGRESS  2.  Pt will increase RUE P/ROM by 40 degrees to improve ability to use RUE during dressing tasks with minimal compensatory techniques.   Goal status: IN PROGRESS  3.  Pt will increase RUE strength to 3+/5 to improve ability to reach for items at waist to chest height during bathing and grooming tasks.   Goal status: IN PROGRESS   LONG TERM GOALS: Target date: 03/17/23  Pt will decrease pain in RUE to 3/10 or less to improve ability to sleep for 2+ consecutive hours without waking due to pain.   Goal status: IN PROGRESS  2.  Pt will decrease RUE fascial  restrictions to min amounts or less to improve mobility required for functional reaching tasks.   Goal status: IN PROGRESS  3.  Pt will increase RUE A/ROM by 50 degrees to improve ability to use RUE when reaching overhead or behind back during dressing and bathing tasks.   Goal status: IN PROGRESS  4.  Pt will increase RUE strength to 4+/5 or greater to improve ability to use RUE when lifting or carrying items during meal preparation/housework/yardwork tasks.   Goal status: IN PROGRESS  5.  Pt will return to highest level of function using RUE as dominant during functional task completion.   Goal status: IN PROGRESS   ASSESSMENT:  CLINICAL IMPRESSION: Pt reports she is completing HEP and exercises are going well. Continued with manual techniques and passive stretching, pt tolerating stretch to approximately 65% flexion, 50% abduction, and to neutral for er. Progressed to AA/ROM in supine and standing, pt doing well with new tasks, achieving ROM to greater than 50% for flexion and abduction, approximately 25% for er. Progressed to full wall wash and added pulleys today. Verbal cuing for form and technique.   PERFORMANCE DEFICITS: in functional skills including in functional skills including ADLs, IADLs, coordination, tone, ROM, strength, pain, fascial restrictions, muscle spasms, and UE functional use.   PLAN:  OT FREQUENCY: 2x/week  OT DURATION: 8 weeks  PLANNED INTERVENTIONS: self care/ADL training, therapeutic exercise, therapeutic activity, neuromuscular re-education, manual therapy, passive range of motion, splinting, electrical stimulation, ultrasound, moist heat, cryotherapy, patient/family education, and DME and/or AE instructions  CONSULTED AND AGREED WITH PLAN OF CARE: Patient  PLAN FOR NEXT SESSION: Manual Therapy, P/ROM, Table Slides, Pendulums, Thumb Tacs    Ezra Sites, OTR/L  [phone] 02/04/2023, 2:39 PM

## 2023-02-04 NOTE — Patient Instructions (Signed)

## 2023-02-06 ENCOUNTER — Ambulatory Visit (HOSPITAL_COMMUNITY): Payer: Medicare Other | Admitting: Occupational Therapy

## 2023-02-06 ENCOUNTER — Encounter (HOSPITAL_COMMUNITY): Payer: Self-pay | Admitting: Occupational Therapy

## 2023-02-06 DIAGNOSIS — M25511 Pain in right shoulder: Secondary | ICD-10-CM

## 2023-02-06 DIAGNOSIS — M25611 Stiffness of right shoulder, not elsewhere classified: Secondary | ICD-10-CM | POA: Diagnosis not present

## 2023-02-06 DIAGNOSIS — R29898 Other symptoms and signs involving the musculoskeletal system: Secondary | ICD-10-CM | POA: Diagnosis not present

## 2023-02-06 NOTE — Therapy (Signed)
OUTPATIENT OCCUPATIONAL THERAPY ORTHO TREATMENT  Patient Name: Nicole Newman MRN: [id number] DOB:23-Sep-1950, 72 y.o., female Today's Date: 02/06/2023   Progress Note Reporting Period 01/17/23 to 02/06/23  See note below for Objective Data and Assessment of Progress/Goals.      END OF SESSION:  OT End of Session - 02/06/23 1009     Visit Number 7    Number of Visits 17    Date for OT Re-Evaluation 03/14/23    Authorization Type BCBS Medicare    Progress Note Due on Visit 10    OT Start Time [phone]    OT Stop Time 1023    OT Time Calculation (min) 40 min    Activity Tolerance Patient tolerated treatment well    Behavior During Therapy [facility] for tasks assessed/performed                Past Medical History:  Diagnosis Date   Anemia    Arthritis    Depression    Dysrhythmia    PVC'S 2017   Family history of adverse reaction to anesthesia    states mother had small MI during anesthesia   GERD (gastroesophageal reflux disease)    High cholesterol    Hypertension    states under control with med., has been on med. x 2.5 yr.   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   BREAST REDUCTION SURGERY     CHOLECYSTECTOMY N/A 05/06/2022   Procedure: LAPAROSCOPIC CHOLECYSTECTOMY;  Surgeon: Fritzi Mandes, MD;  Location: [facility];  Service: General;  Laterality: N/A;   CHONDROPLASTY Left 02/27/2016   Procedure: CHONDROPLASTY;  Surgeon: Salvatore Marvel, MD;  Location: [facility];  Service: Orthopedics;  Laterality: Left;   DIAGNOSTIC LAPAROSCOPY     ESOPHAGEAL DILATION     ESOPHAGOGASTRODUODENOSCOPY (EGD) WITH PROPOFOL N/A 04/01/2019    Procedure: ESOPHAGOGASTRODUODENOSCOPY (EGD) WITH PROPOFOL;  Surgeon: Carman Ching, MD;  Location: [facility];  Service: Endoscopy;  Laterality: N/A;   EXCISION MORTON'S NEUROMA Right    foot   KNEE ARTHROSCOPY WITH LATERAL MENISECTOMY Left 02/27/2016   Procedure: KNEE ARTHROSCOPY WITH LATERAL and MEDIAL MENISCECTOMY, left;  Surgeon: Salvatore Marvel, MD;  Location: [facility];  Service: Orthopedics;  Laterality: Left;   LAPAROSCOPIC BILATERAL SALPINGO OOPHERECTOMY Bilateral 07/06/2018   Procedure: LAPAROSCOPIC BILATERAL SALPINGO OOPHORECTOMY;  Surgeon: Ward, Elenora Fender, MD;  Location: [facility];  Service: Gynecology;  Laterality: Bilateral;   LAPAROSCOPIC HYSTERECTOMY N/A 07/06/2018   Procedure: HYSTERECTOMY TOTAL LAPAROSCOPIC;  Surgeon: Ward, Elenora Fender, MD;  Location: [facility];  Service: Gynecology;  Laterality: N/A;   REDUCTION MAMMAPLASTY Bilateral    SAVORY DILATION N/A 04/01/2019   Procedure: SAVORY DILATION;  Surgeon: Carman Ching, MD;  Location: [facility];  Service: Endoscopy;  Laterality: N/A;   SKIN CANCER EXCISION     Patient Active Problem List   Diagnosis Date Noted   Hypertension    GERD (gastroesophageal reflux disease)    Family history of adverse reaction to anesthesia    Medial meniscus tear 01/30/2016   Lateral meniscal tear left knee 01/30/2016  Palpitations 07/31/2015   Ventricular premature beats 07/31/2015   Tachycardia, unspecified 07/31/2015    PCP: Margaretann Loveless, MD [facility] REFERRING PROVIDER: Ramond Marrow, MD  ONSET DATE: 01/15/23  REFERRING DIAG: R Reverse Total Shoulder  THERAPY DIAG:  Acute pain of right shoulder  Stiffness of right shoulder, not elsewhere classified  Other symptoms and signs involving the musculoskeletal system  Rationale for Evaluation and Treatment: Rehabilitation  SUBJECTIVE:   SUBJECTIVE STATEMENT: S: I'm sleeping better without the pillow.   PERTINENT HISTORY: Pt had a fall 2 years ago  resulting in torn ligaments and increased pain/lack of mobility. Pt had right reverse TSA on 01/15/23.   PRECAUTIONS: Shoulder-see protocol  WEIGHT BEARING RESTRICTIONS: Yes >1lb  PAIN:  Are you having pain? Yes: NPRS scale: 2/10 Pain location: anterior shoulder girdle Pain description: aching and tender Aggravating factors: nothing Relieving factors: Pain Medications  FALLS: Has patient fallen in last 6 months? Yes. Number of falls 1  PLOF: Independent  PATIENT GOALS: "I want my arm to be back at 100%."  NEXT MD VISIT: 02/14/23  OBJECTIVE:   HAND DOMINANCE: Right  ADLs: Overall ADLs: Pt is unable to dress or bath independently, requiring mod to max assist due to weakness, pain, and limitations of the RUE. Additionally at this time she is requiring total assist with all cooking, cleaning, and yard work.   FUNCTIONAL OUTCOME MEASURES: FOTO: Will complete next session  UPPER EXTREMITY ROM:       Assessed in supine, er/IR adducted  Passive ROM Right eval Right 02/06/23  Shoulder flexion 120 129  Shoulder abduction 87 95  Shoulder internal rotation 90 90  Shoulder external rotation 17 20  (Blank rows = not tested)    UPPER EXTREMITY MMT:     Assessed in seated, er/IR adducted  MMT Right eval  Shoulder flexion   Shoulder abduction   Shoulder internal rotation   Shoulder external rotation   Middle trapezius   Lower trapezius   (Blank rows = not tested)  SENSATION: Pt reports that her thumb is numb  EDEMA: Swelling noted in the elbow  OBSERVATIONS: mod to severe fascial restrictions along the biceps, deltoid, scapularis, and pectoralis   TODAY'S TREATMENT:                                                                                                                              DATE:  02/06/23 -Manual Therapy: myofascial release and trigger point applied to biceps, deltoid, scapularis, trapezius, in order to reduce pain and facial restrictions, as well as  improving ROM.  -P/ROM: flexion, abduction, horizontal abduction, er/IR, x5 -AA/ROM: supine-protraction, flexion, horizontal abduction, er/IR, abduction, 10 reps -AA/ROM: standing-protraction, flexion, horizontal abduction, er/IR, abduction, 10 reps -PVC Pipe Slide: 10X flexion -Wall wash: 1' flexion -Pulleys: 1' flexion, 1' abduction  02/04/23 -Manual Therapy: myofascial release and trigger point applied to biceps, deltoid, scapularis, trapezius, in order to reduce pain and facial restrictions, as well  as improving ROM.  -P/ROM: flexion, abduction, horizontal abduction, er/IR, x5 -AA/ROM: supine-protraction, flexion, horizontal abduction, er/IR, abduction, 10 reps -Wall wash: 1' flexion -Pulleys: 1' flexion, 1' abduction  01/30/23 -Manual Therapy: myofascial release and trigger point applied to biceps, deltoid, scapularis, trapezius, in order to reduce pain and facial restrictions, as well as improving ROM.  -P/ROM: flexion, abduction, horizontal abduction, er/IR, x10, 5-8" holds in end range -[facility]: flexion, abduction, 2x15 -elbow ROM x10 -Thumb tacs: 2x60" -Low Level Wall Wash, 2x60"   PATIENT EDUCATION: Education details: reviewed HEP Person educated: Patient Education method: Medical illustrator Education comprehension: verbalized understanding and returned demonstration  HOME EXERCISE PROGRAM: 7/19: Elbow, wrist, and hand ROM 7/22: Table Slides 8/6: AA/ROM  GOALS: Goals reviewed with patient? Yes   SHORT TERM GOALS: Target date: 02/08/23  Pt will be provided with and educated on HEP to improve mobility in RUE required for use during ADL completion.   Goal status: IN PROGRESS  2.  Pt will increase RUE P/ROM by 40 degrees to improve ability to use RUE during dressing tasks with minimal compensatory techniques.   Goal status: IN PROGRESS  3.  Pt will increase RUE strength to 3+/5 to improve ability to reach for items at waist to chest height during  bathing and grooming tasks.   Goal status: IN PROGRESS   LONG TERM GOALS: Target date: 03/17/23  Pt will decrease pain in RUE to 3/10 or less to improve ability to sleep for 2+ consecutive hours without waking due to pain.   Goal status: IN PROGRESS  2.  Pt will decrease RUE fascial restrictions to min amounts or less to improve mobility required for functional reaching tasks.   Goal status: IN PROGRESS  3.  Pt will increase RUE A/ROM by 50 degrees to improve ability to use RUE when reaching overhead or behind back during dressing and bathing tasks.   Goal status: IN PROGRESS  4.  Pt will increase RUE strength to 4+/5 or greater to improve ability to use RUE when lifting or carrying items during meal preparation/housework/yardwork tasks.   Goal status: IN PROGRESS  5.  Pt will return to highest level of function using RUE as dominant during functional task completion.   Goal status: IN PROGRESS   ASSESSMENT:  CLINICAL IMPRESSION: Pt reports she is completing HEP and exercises are going well, she has removed the pillow from the sling and is sleeping better. Continued with manual techniques and passive stretching, pt tolerating stretch to approximately 65% flexion, 50% abduction, and to neutral for er, measurements taken for progress note and upcoming MD appt. Continued with AA/ROM in supine and standing, pt doing well with new tasks, achieving ROM to greater than 50% for flexion and abduction, approximately 25% for er. Added PVC pipe slide today. Pt with intermittent catching in flexion plane today. Verbal cuing for form and technique.   PERFORMANCE DEFICITS: in functional skills including in functional skills including ADLs, IADLs, coordination, tone, ROM, strength, pain, fascial restrictions, muscle spasms, and UE functional use.   PLAN:  OT FREQUENCY: 2x/week  OT DURATION: 8 weeks  PLANNED INTERVENTIONS: self care/ADL training, therapeutic exercise, therapeutic activity,  neuromuscular re-education, manual therapy, passive range of motion, splinting, electrical stimulation, ultrasound, moist heat, cryotherapy, patient/family education, and DME and/or AE instructions  CONSULTED AND AGREED WITH PLAN OF CARE: Patient  PLAN FOR NEXT SESSION: AA/ROM, wall wash, pvc pipe slide    Ezra Sites, OTR/L  [phone] 02/06/2023, 10:27 AM

## 2023-02-10 ENCOUNTER — Encounter (HOSPITAL_COMMUNITY): Payer: Medicare Other | Admitting: Occupational Therapy

## 2023-02-11 ENCOUNTER — Encounter (HOSPITAL_COMMUNITY): Payer: Self-pay | Admitting: Occupational Therapy

## 2023-02-11 ENCOUNTER — Ambulatory Visit (HOSPITAL_COMMUNITY): Payer: Medicare Other | Admitting: Occupational Therapy

## 2023-02-11 DIAGNOSIS — M25611 Stiffness of right shoulder, not elsewhere classified: Secondary | ICD-10-CM

## 2023-02-11 DIAGNOSIS — M25511 Pain in right shoulder: Secondary | ICD-10-CM | POA: Diagnosis not present

## 2023-02-11 DIAGNOSIS — R29898 Other symptoms and signs involving the musculoskeletal system: Secondary | ICD-10-CM

## 2023-02-11 NOTE — Therapy (Signed)
OUTPATIENT OCCUPATIONAL THERAPY ORTHO TREATMENT  Patient Name: Nicole Newman MRN: [id number] DOB:[birth year], 72 y.o., female Today's Date: 02/11/2023   END OF SESSION:  OT End of Session - 02/11/23 0946     Visit Number 8    Number of Visits 17    Date for OT Re-Evaluation 03/14/23    Authorization Type BCBS Medicare    Progress Note Due on Visit 10    OT Start Time 0904    OT Stop Time 0945    OT Time Calculation (min) 41 min    Activity Tolerance Patient tolerated treatment well    Behavior During Therapy [facility] for tasks assessed/performed                 Past Medical History:  Diagnosis Date   Anemia    Arthritis    Depression    Dysrhythmia    PVC'S 2017   Family history of adverse reaction to anesthesia    states mother had small MI during anesthesia   GERD (gastroesophageal reflux disease)    High cholesterol    Hypertension    states under control with med., has been on med. x 2.5 yr.   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   BREAST REDUCTION SURGERY     CHOLECYSTECTOMY N/A 05/06/2022   Procedure: LAPAROSCOPIC CHOLECYSTECTOMY;  Surgeon: Fritzi Mandes, MD;  Location: [facility];  Service: General;  Laterality: N/A;   CHONDROPLASTY Left 02/27/2016   Procedure: CHONDROPLASTY;  Surgeon: Salvatore Marvel, MD;  Location: [facility];  Service: Orthopedics;  Laterality: Left;   DIAGNOSTIC LAPAROSCOPY     ESOPHAGEAL DILATION     ESOPHAGOGASTRODUODENOSCOPY (EGD) WITH PROPOFOL N/A 04/01/2019   Procedure: ESOPHAGOGASTRODUODENOSCOPY (EGD) WITH PROPOFOL;  Surgeon: Carman Ching, MD;  Location: [facility];  Service:  Endoscopy;  Laterality: N/A;   EXCISION MORTON'S NEUROMA Right    foot   KNEE ARTHROSCOPY WITH LATERAL MENISECTOMY Left 02/27/2016   Procedure: KNEE ARTHROSCOPY WITH LATERAL and MEDIAL MENISCECTOMY, left;  Surgeon: Salvatore Marvel, MD;  Location: [facility];  Service: Orthopedics;  Laterality: Left;   LAPAROSCOPIC BILATERAL SALPINGO OOPHERECTOMY Bilateral 07/06/2018   Procedure: LAPAROSCOPIC BILATERAL SALPINGO OOPHORECTOMY;  Surgeon: Ward, Elenora Fender, MD;  Location: [facility];  Service: Gynecology;  Laterality: Bilateral;   LAPAROSCOPIC HYSTERECTOMY N/A 07/06/2018   Procedure: HYSTERECTOMY TOTAL LAPAROSCOPIC;  Surgeon: Ward, Elenora Fender, MD;  Location: [facility];  Service: Gynecology;  Laterality: N/A;   REDUCTION MAMMAPLASTY Bilateral    SAVORY DILATION N/A 04/01/2019   Procedure: SAVORY DILATION;  Surgeon: Carman Ching, MD;  Location: [facility];  Service: Endoscopy;  Laterality: N/A;   SKIN CANCER EXCISION     Patient Active Problem List   Diagnosis Date Noted   Hypertension    GERD (gastroesophageal reflux disease)    Family history of adverse reaction to anesthesia    Medial meniscus tear 01/30/2016   Lateral meniscal tear left knee 01/30/2016   Palpitations 07/31/2015   Ventricular premature beats 07/31/2015   Tachycardia, unspecified 07/31/2015    PCP: Margaretann Loveless, MD - [facility]  Internal Medicine REFERRING PROVIDER: Ramond Marrow, MD  ONSET DATE: 01/15/23  REFERRING DIAG: R Reverse Total Shoulder  THERAPY DIAG:  Acute pain of right shoulder  Stiffness of right shoulder, not elsewhere classified  Other symptoms and signs involving the musculoskeletal system  Rationale for Evaluation and Treatment: Rehabilitation  SUBJECTIVE:   SUBJECTIVE STATEMENT: S:   PERTINENT HISTORY: Pt had a fall 2 years ago resulting in torn ligaments and increased pain/lack of mobility. Pt had right reverse TSA on 01/15/23.   PRECAUTIONS: Shoulder-see protocol  WEIGHT BEARING  RESTRICTIONS: Yes >1lb  PAIN:  Are you having pain? Yes: NPRS scale: 2/10 Pain location: anterior shoulder girdle Pain description: aching and tender Aggravating factors: nothing Relieving factors: Pain Medications  FALLS: Has patient fallen in last 6 months? Yes. Number of falls 1  PLOF: Independent  PATIENT GOALS: "I want my arm to be back at 100%."  NEXT MD VISIT: 02/14/23  OBJECTIVE:   HAND DOMINANCE: Right  ADLs: Overall ADLs: Pt is unable to dress or bath independently, requiring mod to max assist due to weakness, pain, and limitations of the RUE. Additionally at this time she is requiring total assist with all cooking, cleaning, and yard work.   FUNCTIONAL OUTCOME MEASURES: FOTO: Will complete next session  UPPER EXTREMITY ROM:       Assessed in supine, er/IR adducted  Passive ROM Right eval Right 02/06/23  Shoulder flexion 120 129  Shoulder abduction 87 95  Shoulder internal rotation 90 90  Shoulder external rotation 17 20  (Blank rows = not tested)    UPPER EXTREMITY MMT:     Assessed in seated, er/IR adducted  MMT Right eval  Shoulder flexion   Shoulder abduction   Shoulder internal rotation   Shoulder external rotation   Middle trapezius   Lower trapezius   (Blank rows = not tested)  SENSATION: Pt reports that her thumb is numb  EDEMA: Swelling noted in the elbow  OBSERVATIONS: mod to severe fascial restrictions along the biceps, deltoid, scapularis, and pectoralis   TODAY'S TREATMENT:                                                                                                                              DATE:  02/11/23 -Manual Therapy: myofascial release and trigger point applied to biceps, deltoid, scapularis, trapezius, in order to reduce pain and facial restrictions, as well as improving ROM.  -P/ROM: flexion, abduction, horizontal abduction, er/IR, x5 -AA/ROM: supine-protraction, flexion, horizontal abduction, er/IR, abduction, 10  reps -AA/ROM: standing-protraction, flexion, horizontal abduction, er/IR, abduction, 10 reps -PVC Pipe Slide: 10X flexion -Pulleys: 1' flexion, 1' abduction  02/06/23 -Manual Therapy: myofascial release and trigger point applied to biceps, deltoid, scapularis, trapezius, in order to reduce pain and facial restrictions, as well as improving ROM.  -P/ROM: flexion, abduction, horizontal abduction, er/IR, x5 -AA/ROM: supine-protraction, flexion, horizontal abduction, er/IR, abduction, 10 reps -AA/ROM: standing-protraction, flexion, horizontal abduction, er/IR, abduction, 10 reps -PVC Pipe  Slide: 10X flexion -Wall wash: 1' flexion -Pulleys: 1' flexion, 1' abduction  02/04/23 -Manual Therapy: myofascial release and trigger point applied to biceps, deltoid, scapularis, trapezius, in order to reduce pain and facial restrictions, as well as improving ROM.  -P/ROM: flexion, abduction, horizontal abduction, er/IR, x5 -AA/ROM: supine-protraction, flexion, horizontal abduction, er/IR, abduction, 10 reps -Wall wash: 1' flexion -Pulleys: 1' flexion, 1' abduction    PATIENT EDUCATION: Education details: reviewed HEP Person educated: Patient Education method: Explanation and Demonstration Education comprehension: verbalized understanding and returned demonstration  HOME EXERCISE PROGRAM: 7/19: Elbow, wrist, and hand ROM 7/22: Table Slides 8/6: AA/ROM  GOALS: Goals reviewed with patient? Yes   SHORT TERM GOALS: Target date: 02/08/23  Pt will be provided with and educated on HEP to improve mobility in RUE required for use during ADL completion.   Goal status: IN PROGRESS  2.  Pt will increase RUE P/ROM by 40 degrees to improve ability to use RUE during dressing tasks with minimal compensatory techniques.   Goal status: IN PROGRESS  3.  Pt will increase RUE strength to 3+/5 to improve ability to reach for items at waist to chest height during bathing and grooming tasks.   Goal status: IN  PROGRESS   LONG TERM GOALS: Target date: 03/17/23  Pt will decrease pain in RUE to 3/10 or less to improve ability to sleep for 2+ consecutive hours without waking due to pain.   Goal status: IN PROGRESS  2.  Pt will decrease RUE fascial restrictions to min amounts or less to improve mobility required for functional reaching tasks.   Goal status: IN PROGRESS  3.  Pt will increase RUE A/ROM by 50 degrees to improve ability to use RUE when reaching overhead or behind back during dressing and bathing tasks.   Goal status: IN PROGRESS  4.  Pt will increase RUE strength to 4+/5 or greater to improve ability to use RUE when lifting or carrying items during meal preparation/housework/yardwork tasks.   Goal status: IN PROGRESS  5.  Pt will return to highest level of function using RUE as dominant during functional task completion.   Goal status: IN PROGRESS   ASSESSMENT:  CLINICAL IMPRESSION: Pt reports HEP is going well, she is sleeping on her back. Continued with manual techniques and passive stretching, AA/ROM. Continued with pulleys, pt is achieving more ROM, today at approximately 65-75% ROM. Continues to have intermittent pain and catching in abduction, resolves quickly. Verbal cuing for form and technique.   PERFORMANCE DEFICITS: in functional skills including in functional skills including ADLs, IADLs, coordination, tone, ROM, strength, pain, fascial restrictions, muscle spasms, and UE functional use.   PLAN:  OT FREQUENCY: 2x/week  OT DURATION: 8 weeks  PLANNED INTERVENTIONS: self care/ADL training, therapeutic exercise, therapeutic activity, neuromuscular re-education, manual therapy, passive range of motion, splinting, electrical stimulation, ultrasound, moist heat, cryotherapy, patient/family education, and DME and/or AE instructions  CONSULTED AND AGREED WITH PLAN OF CARE: Patient  PLAN FOR NEXT SESSION: Measure for MD appt, AA/ROM, wall wash, pvc pipe  slide    Ezra Sites, OTR/L  [phone] 02/11/2023, 9:46 AM

## 2023-02-13 ENCOUNTER — Encounter (HOSPITAL_COMMUNITY): Payer: Self-pay | Admitting: Occupational Therapy

## 2023-02-13 ENCOUNTER — Ambulatory Visit (HOSPITAL_COMMUNITY): Payer: Medicare Other | Admitting: Occupational Therapy

## 2023-02-13 DIAGNOSIS — M25611 Stiffness of right shoulder, not elsewhere classified: Secondary | ICD-10-CM | POA: Diagnosis not present

## 2023-02-13 DIAGNOSIS — M25511 Pain in right shoulder: Secondary | ICD-10-CM | POA: Diagnosis not present

## 2023-02-13 DIAGNOSIS — R29898 Other symptoms and signs involving the musculoskeletal system: Secondary | ICD-10-CM

## 2023-02-13 NOTE — Therapy (Signed)
OUTPATIENT OCCUPATIONAL THERAPY ORTHO TREATMENT  Patient Name: Nicole Newman MRN: [id number] DOB:1951/05/13, 72 y.o., female Today's Date: 02/13/2023   END OF SESSION:  OT End of Session - 02/13/23 0951     Visit Number 9    Number of Visits 17    Date for OT Re-Evaluation 03/14/23    Authorization Type BCBS Medicare    Progress Note Due on Visit 10    OT Start Time 0905    OT Stop Time 0947    OT Time Calculation (min) 42 min    Activity Tolerance Patient tolerated treatment well    Behavior During Therapy [facility] for tasks assessed/performed             Past Medical History:  Diagnosis Date   Anemia    Arthritis    Depression    Dysrhythmia    PVC'S 2017   Family history of adverse reaction to anesthesia    states mother had small MI during anesthesia   GERD (gastroesophageal reflux disease)    High cholesterol    Hypertension    states under control with med., has been on med. x 2.5 yr.   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   BREAST REDUCTION SURGERY     CHOLECYSTECTOMY N/A 05/06/2022   Procedure: LAPAROSCOPIC CHOLECYSTECTOMY;  Surgeon: Fritzi Mandes, MD;  Location: [facility];  Service: General;  Laterality: N/A;   CHONDROPLASTY Left 02/27/2016   Procedure: CHONDROPLASTY;  Surgeon: Salvatore Marvel, MD;  Location: [facility];  Service: Orthopedics;  Laterality: Left;   DIAGNOSTIC LAPAROSCOPY     ESOPHAGEAL DILATION     ESOPHAGOGASTRODUODENOSCOPY (EGD) WITH PROPOFOL N/A 04/01/2019   Procedure: ESOPHAGOGASTRODUODENOSCOPY (EGD) WITH PROPOFOL;  Surgeon: Carman Ching, MD;  Location: [facility];  Service: Endoscopy;   Laterality: N/A;   EXCISION MORTON'S NEUROMA Right    foot   KNEE ARTHROSCOPY WITH LATERAL MENISECTOMY Left 02/27/2016   Procedure: KNEE ARTHROSCOPY WITH LATERAL and MEDIAL MENISCECTOMY, left;  Surgeon: Salvatore Marvel, MD;  Location: [facility];  Service: Orthopedics;  Laterality: Left;   LAPAROSCOPIC BILATERAL SALPINGO OOPHERECTOMY Bilateral 07/06/2018   Procedure: LAPAROSCOPIC BILATERAL SALPINGO OOPHORECTOMY;  Surgeon: Ward, Elenora Fender, MD;  Location: [facility];  Service: Gynecology;  Laterality: Bilateral;   LAPAROSCOPIC HYSTERECTOMY N/A 07/06/2018   Procedure: HYSTERECTOMY TOTAL LAPAROSCOPIC;  Surgeon: Ward, Elenora Fender, MD;  Location: [facility];  Service: Gynecology;  Laterality: N/A;   REDUCTION MAMMAPLASTY Bilateral    SAVORY DILATION N/A 04/01/2019   Procedure: SAVORY DILATION;  Surgeon: Carman Ching, MD;  Location: [facility];  Service: Endoscopy;  Laterality: N/A;   SKIN CANCER EXCISION     Patient Active Problem List   Diagnosis Date Noted   Hypertension    GERD (gastroesophageal reflux disease)    Family history of adverse reaction to anesthesia    Medial meniscus tear 01/30/2016   Lateral meniscal tear left knee 01/30/2016   Palpitations 07/31/2015   Ventricular premature beats 07/31/2015   Tachycardia, unspecified 07/31/2015    PCP: Margaretann Loveless, MD [facility] REFERRING PROVIDER:  Ramond Marrow, MD  ONSET DATE: 01/15/23  REFERRING DIAG: R Reverse Total Shoulder  THERAPY DIAG:  Acute pain of right shoulder  Stiffness of right shoulder, not elsewhere classified  Other symptoms and signs involving the musculoskeletal system  Rationale for Evaluation and Treatment: Rehabilitation  SUBJECTIVE:   SUBJECTIVE STATEMENT: S:   PERTINENT HISTORY: Pt had a fall 2 years ago resulting in torn ligaments and increased pain/lack of mobility. Pt had right reverse TSA on 01/15/23.   PRECAUTIONS: Shoulder-see protocol  WEIGHT BEARING RESTRICTIONS: Yes  >1lb  PAIN:  Are you having pain? Yes: NPRS scale: 2/10 Pain location: anterior shoulder girdle Pain description: aching and tender Aggravating factors: nothing Relieving factors: Pain Medications  FALLS: Has patient fallen in last 6 months? Yes. Number of falls 1  PLOF: Independent  PATIENT GOALS: "I want my arm to be back at 100%."  NEXT MD VISIT: 02/14/23  OBJECTIVE:   HAND DOMINANCE: Right  ADLs: Overall ADLs: Pt is unable to dress or bath independently, requiring mod to max assist due to weakness, pain, and limitations of the RUE. Additionally at this time she is requiring total assist with all cooking, cleaning, and yard work.   FUNCTIONAL OUTCOME MEASURES: FOTO: Will complete next session  UPPER EXTREMITY ROM:       Assessed in supine, er/IR adducted  Passive ROM Right eval Right 02/06/23  Shoulder flexion 120 129  Shoulder abduction 87 95  Shoulder internal rotation 90 90  Shoulder external rotation 17 20  (Blank rows = not tested)    UPPER EXTREMITY MMT:     Assessed in seated, er/IR adducted  MMT Right eval  Shoulder flexion   Shoulder abduction   Shoulder internal rotation   Shoulder external rotation   Middle trapezius   Lower trapezius   (Blank rows = not tested)  SENSATION: Pt reports that her thumb is numb  EDEMA: Swelling noted in the elbow  OBSERVATIONS: mod to severe fascial restrictions along the biceps, deltoid, scapularis, and pectoralis   TODAY'S TREATMENT:                                                                                                                              DATE:  02/13/23 -Manual Therapy: myofascial release and trigger point applied to biceps, deltoid, scapularis, trapezius, in order to reduce pain and facial restrictions, as well as improving ROM.  -P/ROM: flexion, abduction, horizontal abduction, er/IR, x8 -AA/ROM: supine-protraction, flexion, horizontal abduction, er/IR, abduction, x12 -A/ROM: supine,  protraction, flexion, horizontal abduction, x6 -Wall Slides: flexion, abduction, x10 -AA/ROM: standing-protraction, flexion, horizontal abduction, er/IR, abduction, 10 reps  02/11/23 -Manual Therapy: myofascial release and trigger point applied to biceps, deltoid, scapularis, trapezius, in order to reduce pain and facial restrictions, as well as improving ROM.  -P/ROM: flexion, abduction, horizontal abduction, er/IR, x5 -AA/ROM: supine-protraction, flexion, horizontal abduction, er/IR, abduction, 10 reps -AA/ROM: standing-protraction, flexion, horizontal abduction, er/IR, abduction, 10 reps -PVC Pipe Slide: 10X flexion -  Pulleys: 1' flexion, 1' abduction  02/06/23 -Manual Therapy: myofascial release and trigger point applied to biceps, deltoid, scapularis, trapezius, in order to reduce pain and facial restrictions, as well as improving ROM.  -P/ROM: flexion, abduction, horizontal abduction, er/IR, x5 -AA/ROM: supine-protraction, flexion, horizontal abduction, er/IR, abduction, 10 reps -AA/ROM: standing-protraction, flexion, horizontal abduction, er/IR, abduction, 10 reps -PVC Pipe Slide: 10X flexion -Wall wash: 1' flexion -Pulleys: 1' flexion, 1' abduction   PATIENT EDUCATION: Education details: reviewed HEP Person educated: Patient Education method: Explanation and Demonstration Education comprehension: verbalized understanding and returned demonstration  HOME EXERCISE PROGRAM: 7/19: Elbow, wrist, and hand ROM 7/22: Table Slides 8/6: AA/ROM  GOALS: Goals reviewed with patient? Yes   SHORT TERM GOALS: Target date: 02/08/23  Pt will be provided with and educated on HEP to improve mobility in RUE required for use during ADL completion.   Goal status: IN PROGRESS  2.  Pt will increase RUE P/ROM by 40 degrees to improve ability to use RUE during dressing tasks with minimal compensatory techniques.   Goal status: IN PROGRESS  3.  Pt will increase RUE strength to 3+/5 to improve  ability to reach for items at waist to chest height during bathing and grooming tasks.   Goal status: IN PROGRESS   LONG TERM GOALS: Target date: 03/17/23  Pt will decrease pain in RUE to 3/10 or less to improve ability to sleep for 2+ consecutive hours without waking due to pain.   Goal status: IN PROGRESS  2.  Pt will decrease RUE fascial restrictions to min amounts or less to improve mobility required for functional reaching tasks.   Goal status: IN PROGRESS  3.  Pt will increase RUE A/ROM by 50 degrees to improve ability to use RUE when reaching overhead or behind back during dressing and bathing tasks.   Goal status: IN PROGRESS  4.  Pt will increase RUE strength to 4+/5 or greater to improve ability to use RUE when lifting or carrying items during meal preparation/housework/yardwork tasks.   Goal status: IN PROGRESS  5.  Pt will return to highest level of function using RUE as dominant during functional task completion.   Goal status: IN PROGRESS   ASSESSMENT:  CLINICAL IMPRESSION: This session, pt continues to make improvements with overall ROM and movement pattern. She reported no pops this session, however with AA/ROM at home, she continues to have some mild popping with no pain. OT started pt on limited A/ROM, which she completed well with no pain, and wall slides to help with gentle/passive stretching into flexion and abduction. OT providing verbal and tactile cuing for positioning and technique, as well as physically stopping her arm from going too far behind her with new exercises.    PERFORMANCE DEFICITS: in functional skills including in functional skills including ADLs, IADLs, coordination, tone, ROM, strength, pain, fascial restrictions, muscle spasms, and UE functional use.   PLAN:  OT FREQUENCY: 2x/week  OT DURATION: 8 weeks  PLANNED INTERVENTIONS: self care/ADL training, therapeutic exercise, therapeutic activity, neuromuscular re-education, manual  therapy, passive range of motion, splinting, electrical stimulation, ultrasound, moist heat, cryotherapy, patient/family education, and DME and/or AE instructions  CONSULTED AND AGREED WITH PLAN OF CARE: Patient  PLAN FOR NEXT SESSION: Manual Therapy, AA/ROM, A/ROM, wall slides    Trish Mage, OTR/L [phone] 02/13/2023, 9:52 AM

## 2023-02-14 DIAGNOSIS — M19011 Primary osteoarthritis, right shoulder: Secondary | ICD-10-CM | POA: Diagnosis not present

## 2023-02-17 ENCOUNTER — Encounter (HOSPITAL_COMMUNITY): Payer: Self-pay | Admitting: Occupational Therapy

## 2023-02-17 ENCOUNTER — Ambulatory Visit (HOSPITAL_COMMUNITY): Payer: Medicare Other | Admitting: Occupational Therapy

## 2023-02-17 DIAGNOSIS — M25611 Stiffness of right shoulder, not elsewhere classified: Secondary | ICD-10-CM

## 2023-02-17 DIAGNOSIS — R29898 Other symptoms and signs involving the musculoskeletal system: Secondary | ICD-10-CM | POA: Diagnosis not present

## 2023-02-17 DIAGNOSIS — M25511 Pain in right shoulder: Secondary | ICD-10-CM | POA: Diagnosis not present

## 2023-02-17 NOTE — Therapy (Signed)
OUTPATIENT OCCUPATIONAL THERAPY ORTHO TREATMENT  Patient Name: Nicole Newman MRN: [id number] DOB:1950/09/24, 72 y.o., female Today's Date: 02/17/2023   END OF SESSION:  OT End of Session - 02/17/23 0945     Visit Number 10    Number of Visits 17    Date for OT Re-Evaluation 03/14/23    Authorization Type [employer] Medicare    Progress Note Due on Visit 10    OT Start Time 0902    OT Stop Time 0946    OT Time Calculation (min) 44 min    Activity Tolerance Patient tolerated treatment well    Behavior During Therapy [facility] for tasks assessed/performed             Past Medical History:  Diagnosis Date   Anemia    Arthritis    Depression    Dysrhythmia    PVC'S 2017   Family history of adverse reaction to anesthesia    states mother had small MI during anesthesia   GERD (gastroesophageal reflux disease)    High cholesterol    Hypertension    states under control with med., has been on med. x 2.5 yr.   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   BREAST REDUCTION SURGERY     CHOLECYSTECTOMY N/A 05/06/2022   Procedure: LAPAROSCOPIC CHOLECYSTECTOMY;  Surgeon: Fritzi Mandes, MD;  Location: [facility];  Service: General;  Laterality: N/A;   CHONDROPLASTY Left 02/27/2016   Procedure: CHONDROPLASTY;  Surgeon: Salvatore Marvel, MD;  Location: [facility];  Service: Orthopedics;  Laterality: Left;   DIAGNOSTIC LAPAROSCOPY     ESOPHAGEAL DILATION     ESOPHAGOGASTRODUODENOSCOPY (EGD) WITH PROPOFOL N/A 04/01/2019   Procedure: ESOPHAGOGASTRODUODENOSCOPY (EGD) WITH PROPOFOL;  Surgeon: Carman Ching, MD;  Location: [facility];  Service: Endoscopy;   Laterality: N/A;   EXCISION MORTON'S NEUROMA Right    foot   KNEE ARTHROSCOPY WITH LATERAL MENISECTOMY Left 02/27/2016   Procedure: KNEE ARTHROSCOPY WITH LATERAL and MEDIAL MENISCECTOMY, left;  Surgeon: Salvatore Marvel, MD;  Location: [facility];  Service: Orthopedics;  Laterality: Left;   LAPAROSCOPIC BILATERAL SALPINGO OOPHERECTOMY Bilateral 07/06/2018   Procedure: LAPAROSCOPIC BILATERAL SALPINGO OOPHORECTOMY;  Surgeon: Ward, Elenora Fender, MD;  Location: [facility];  Service: Gynecology;  Laterality: Bilateral;   LAPAROSCOPIC HYSTERECTOMY N/A 07/06/2018   Procedure: HYSTERECTOMY TOTAL LAPAROSCOPIC;  Surgeon: Ward, Elenora Fender, MD;  Location: [facility];  Service: Gynecology;  Laterality: N/A;   REDUCTION MAMMAPLASTY Bilateral    SAVORY DILATION N/A 04/01/2019   Procedure: SAVORY DILATION;  Surgeon: Carman Ching, MD;  Location: [facility];  Service: Endoscopy;  Laterality: N/A;   SKIN CANCER EXCISION     Patient Active Problem List   Diagnosis Date Noted   Hypertension    GERD (gastroesophageal reflux disease)    Family history of adverse reaction to anesthesia    Medial meniscus tear 01/30/2016   Lateral meniscal tear left knee 01/30/2016   Palpitations 07/31/2015   Ventricular premature beats 07/31/2015   Tachycardia, unspecified 07/31/2015    PCP: Margaretann Loveless, MD [facility] Internal Medicine REFERRING PROVIDER:  Ramond Marrow, MD  ONSET DATE: 01/15/23  REFERRING DIAG: R Reverse Total Shoulder  THERAPY DIAG:  Acute pain of right shoulder  Other symptoms and signs involving the musculoskeletal system  Stiffness of right shoulder, not elsewhere classified  Rationale for Evaluation and Treatment: Rehabilitation  SUBJECTIVE:   SUBJECTIVE STATEMENT: S: "I am using it a lot more at home"  PERTINENT HISTORY: Pt had a fall 2 years ago resulting in torn ligaments and increased pain/lack of mobility. Pt had right reverse TSA on 01/15/23.   PRECAUTIONS: Shoulder-see  protocol  WEIGHT BEARING RESTRICTIONS: Yes >1lb  PAIN:  Are you having pain? Yes: NPRS scale: 2/10 Pain location: anterior shoulder girdle Pain description: aching and tender Aggravating factors: nothing Relieving factors: Pain Medications  FALLS: Has patient fallen in last 6 months? Yes. Number of falls 1  PLOF: Independent  PATIENT GOALS: "I want my arm to be back at 100%."  NEXT MD VISIT: 02/14/23  OBJECTIVE:   HAND DOMINANCE: Right  ADLs: Overall ADLs: Pt is unable to dress or bath independently, requiring mod to max assist due to weakness, pain, and limitations of the RUE. Additionally at this time she is requiring total assist with all cooking, cleaning, and yard work.   FUNCTIONAL OUTCOME MEASURES: FOTO: Will complete next session  UPPER EXTREMITY ROM:       Assessed in supine, er/IR adducted  Passive ROM Right eval Right 02/06/23  Shoulder flexion 120 129  Shoulder abduction 87 95  Shoulder internal rotation 90 90  Shoulder external rotation 17 20  (Blank rows = not tested)    UPPER EXTREMITY MMT:     Assessed in seated, er/IR adducted  MMT Right eval  Shoulder flexion   Shoulder abduction   Shoulder internal rotation   Shoulder external rotation   Middle trapezius   Lower trapezius   (Blank rows = not tested)  SENSATION: Pt reports that her thumb is numb  EDEMA: Swelling noted in the elbow  OBSERVATIONS: mod to severe fascial restrictions along the biceps, deltoid, scapularis, and pectoralis   TODAY'S TREATMENT:                                                                                                                              DATE:   02/17/23 -Manual Therapy: myofascial release and trigger point applied to biceps, deltoid, scapularis, trapezius, in order to reduce pain and facial restrictions, as well as improving ROM.  -AA/ROM: 2lb dowel, standing, flexion, abduction, protraction, horizontal abduction, er/IR, x15 -A/ROM: standing,  flexion, abduction, protraction, horizontal abduction, er/IR, x10 -Proximal shoulder exercise: paddles, criss cross, circles both directions, x10 each -Wall Slides: flexion, abduction, x10 -Overhead Lacing  02/13/23 -Manual Therapy: myofascial release and trigger point applied to biceps, deltoid, scapularis, trapezius, in order to reduce pain and facial restrictions, as well as improving ROM.  -P/ROM: flexion, abduction, horizontal abduction, er/IR, x8 -AA/ROM: supine-protraction, flexion, horizontal abduction, er/IR, abduction, x12 -A/ROM: supine, protraction, flexion,  horizontal abduction, x6 -Wall Slides: flexion, abduction, x10 -AA/ROM: standing-protraction, flexion, horizontal abduction, er/IR, abduction, 10 reps  02/11/23 -Manual Therapy: myofascial release and trigger point applied to biceps, deltoid, scapularis, trapezius, in order to reduce pain and facial restrictions, as well as improving ROM.  -P/ROM: flexion, abduction, horizontal abduction, er/IR, x5 -AA/ROM: supine-protraction, flexion, horizontal abduction, er/IR, abduction, 10 reps -AA/ROM: standing-protraction, flexion, horizontal abduction, er/IR, abduction, 10 reps -PVC Pipe Slide: 10X flexion -Pulleys: 1' flexion, 1' abduction   PATIENT EDUCATION: Education details: A/ROM Person educated: Patient Education method: Explanation and Demonstration Education comprehension: verbalized understanding and returned demonstration  HOME EXERCISE PROGRAM: 7/19: Elbow, wrist, and hand ROM 7/22: Table Slides 8/6: AA/ROM 8/19: A/ROM  GOALS: Goals reviewed with patient? Yes   SHORT TERM GOALS: Target date: 02/08/23  Pt will be provided with and educated on HEP to improve mobility in RUE required for use during ADL completion.   Goal status: IN PROGRESS  2.  Pt will increase RUE P/ROM by 40 degrees to improve ability to use RUE during dressing tasks with minimal compensatory techniques.   Goal status: IN PROGRESS  3.   Pt will increase RUE strength to 3+/5 to improve ability to reach for items at waist to chest height during bathing and grooming tasks.   Goal status: IN PROGRESS   LONG TERM GOALS: Target date: 03/17/23  Pt will decrease pain in RUE to 3/10 or less to improve ability to sleep for 2+ consecutive hours without waking due to pain.   Goal status: IN PROGRESS  2.  Pt will decrease RUE fascial restrictions to min amounts or less to improve mobility required for functional reaching tasks.   Goal status: IN PROGRESS  3.  Pt will increase RUE A/ROM by 50 degrees to improve ability to use RUE when reaching overhead or behind back during dressing and bathing tasks.   Goal status: IN PROGRESS  4.  Pt will increase RUE strength to 4+/5 or greater to improve ability to use RUE when lifting or carrying items during meal preparation/housework/yardwork tasks.   Goal status: IN PROGRESS  5.  Pt will return to highest level of function using RUE as dominant during functional task completion.   Goal status: IN PROGRESS   ASSESSMENT:  CLINICAL IMPRESSION: This session, pt presented to therapy with no sling, reporting overall improved ROM and decreased pain. She continues to have decreased endurance, requiring frequent rest breaks, however they are becoming shorter in time. OT initiated multiple endurance based activities this session, as well as proximal shoulder exercises to target and improve her overall activity tolerance. Verbal and visual cuing provided throughout session for positioning and technique.   PERFORMANCE DEFICITS: in functional skills including in functional skills including ADLs, IADLs, coordination, tone, ROM, strength, pain, fascial restrictions, muscle spasms, and UE functional use.   PLAN:  OT FREQUENCY: 2x/week  OT DURATION: 8 weeks  PLANNED INTERVENTIONS: self care/ADL training, therapeutic exercise, therapeutic activity, neuromuscular re-education, manual therapy,  passive range of motion, splinting, electrical stimulation, ultrasound, moist heat, cryotherapy, patient/family education, and DME and/or AE instructions  CONSULTED AND AGREED WITH PLAN OF CARE: Patient  PLAN FOR NEXT SESSION: Manual Therapy, AA/ROM, A/ROM, wall slides    Trish Mage, OTR/L [phone] 02/17/2023, 9:47 AM

## 2023-02-17 NOTE — Patient Instructions (Signed)

## 2023-02-20 ENCOUNTER — Ambulatory Visit (HOSPITAL_COMMUNITY): Payer: Medicare Other | Admitting: Occupational Therapy

## 2023-02-20 ENCOUNTER — Encounter (HOSPITAL_COMMUNITY): Payer: Self-pay | Admitting: Occupational Therapy

## 2023-02-20 DIAGNOSIS — R29898 Other symptoms and signs involving the musculoskeletal system: Secondary | ICD-10-CM

## 2023-02-20 DIAGNOSIS — M25611 Stiffness of right shoulder, not elsewhere classified: Secondary | ICD-10-CM

## 2023-02-20 DIAGNOSIS — M25511 Pain in right shoulder: Secondary | ICD-10-CM

## 2023-02-20 NOTE — Therapy (Signed)
OUTPATIENT OCCUPATIONAL THERAPY ORTHO TREATMENT NOTE AND PROGRESS NOTE  Patient Name: Nicole Newman MRN: [id number] DOB:1950/09/10, 72 y.o., female Today's Date: 02/20/2023  Progress Note Reporting Period 02/06/23 to 02/20/23  See note below for Objective Data and Assessment of Progress/Goals.    END OF SESSION:  OT End of Session - 02/20/23 1447     Visit Number 11    Number of Visits 17    Date for OT Re-Evaluation 03/14/23    Authorization Type [employer] Medicare    Progress Note Due on Visit 10    OT Start Time 0905    OT Stop Time 0946    OT Time Calculation (min) 41 min    Activity Tolerance Patient tolerated treatment well    Behavior During Therapy [facility] for tasks assessed/performed             Past Medical History:  Diagnosis Date   Anemia    Arthritis    Depression    Dysrhythmia    PVC'S 2017   Family history of adverse reaction to anesthesia    states mother had small MI during anesthesia   GERD (gastroesophageal reflux disease)    High cholesterol    Hypertension    states under control with med., has been on med. x 2.5 yr.   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   BREAST REDUCTION SURGERY     CHOLECYSTECTOMY N/A 05/06/2022   Procedure: LAPAROSCOPIC CHOLECYSTECTOMY;  Surgeon: Fritzi Mandes, MD;  Location: [facility];  Service: General;  Laterality: N/A;   CHONDROPLASTY Left 02/27/2016   Procedure: CHONDROPLASTY;  Surgeon: Salvatore Marvel, MD;  Location: [facility];  Service: Orthopedics;  Laterality: Left;   DIAGNOSTIC LAPAROSCOPY     ESOPHAGEAL DILATION     ESOPHAGOGASTRODUODENOSCOPY (EGD) WITH PROPOFOL N/A  04/01/2019   Procedure: ESOPHAGOGASTRODUODENOSCOPY (EGD) WITH PROPOFOL;  Surgeon: Carman Ching, MD;  Location: [facility];  Service: Endoscopy;  Laterality: N/A;   EXCISION MORTON'S NEUROMA Right    foot   KNEE ARTHROSCOPY WITH LATERAL MENISECTOMY Left 02/27/2016   Procedure: KNEE ARTHROSCOPY WITH LATERAL and MEDIAL MENISCECTOMY, left;  Surgeon: Salvatore Marvel, MD;  Location: [facility];  Service: Orthopedics;  Laterality: Left;   LAPAROSCOPIC BILATERAL SALPINGO OOPHERECTOMY Bilateral 07/06/2018   Procedure: LAPAROSCOPIC BILATERAL SALPINGO OOPHORECTOMY;  Surgeon: Ward, Elenora Fender, MD;  Location: [facility];  Service: Gynecology;  Laterality: Bilateral;   LAPAROSCOPIC HYSTERECTOMY N/A 07/06/2018   Procedure: HYSTERECTOMY TOTAL LAPAROSCOPIC;  Surgeon: Ward, Elenora Fender, MD;  Location: [facility];  Service: Gynecology;  Laterality: N/A;   REDUCTION MAMMAPLASTY Bilateral    SAVORY DILATION N/A 04/01/2019   Procedure: SAVORY DILATION;  Surgeon: Carman Ching, MD;  Location: [facility];  Service: Endoscopy;  Laterality: N/A;   SKIN CANCER EXCISION     Patient Active Problem List   Diagnosis Date Noted   Hypertension    GERD (gastroesophageal reflux disease)    Family history of adverse reaction to anesthesia    Medial meniscus tear 01/30/2016   Lateral meniscal tear left knee 01/30/2016   Palpitations  07/31/2015   Ventricular premature beats 07/31/2015   Tachycardia, unspecified 07/31/2015    PCP: Margaretann Loveless, MD [facility] Internal Medicine REFERRING PROVIDER: Ramond Marrow, MD  ONSET DATE: 01/15/23  REFERRING DIAG: R Reverse Total Shoulder  THERAPY DIAG:  Acute pain of right shoulder  Other symptoms and signs involving the musculoskeletal system  Stiffness of right shoulder, not elsewhere classified  Rationale for Evaluation and Treatment: Rehabilitation  SUBJECTIVE:   SUBJECTIVE STATEMENT: S: "I'm trying to reach up with it more"  PERTINENT HISTORY: Pt had a fall 2 years  ago resulting in torn ligaments and increased pain/lack of mobility. Pt had right reverse TSA on 01/15/23.   PRECAUTIONS: Shoulder-see protocol  WEIGHT BEARING RESTRICTIONS: Yes >1lb  PAIN:  Are you having pain? No  FALLS: Has patient fallen in last 6 months? Yes. Number of falls 1  PLOF: Independent  PATIENT GOALS: "I want my arm to be back at 100%."  NEXT MD VISIT: 02/14/23  OBJECTIVE:   HAND DOMINANCE: Right  ADLs: Overall ADLs: Pt is unable to dress or bath independently, requiring mod to max assist due to weakness, pain, and limitations of the RUE. Additionally at this time she is requiring total assist with all cooking, cleaning, and yard work.   FUNCTIONAL OUTCOME MEASURES: FOTO: Will complete next session  UPPER EXTREMITY ROM:       Assessed in supine, er/IR adducted  Passive ROM Right eval Right 02/06/23  Shoulder flexion 120 129  Shoulder abduction 87 95  Shoulder internal rotation 90 90  Shoulder external rotation 17 20  (Blank rows = not tested)     Active ROM Right 02/20/23  Shoulder flexion 110  Shoulder abduction 98  Shoulder internal rotation 90  Shoulder external rotation 44  (Blank rows = not tested)  UPPER EXTREMITY MMT:     Assessed in seated, er/IR adducted  MMT Right eval  Shoulder flexion   Shoulder abduction   Shoulder internal rotation   Shoulder external rotation   Middle trapezius   Lower trapezius   (Blank rows = not tested)  SENSATION: Pt reports that her thumb is numb  EDEMA: Swelling noted in the elbow  OBSERVATIONS: mod to severe fascial restrictions along the biceps, deltoid, scapularis, and pectoralis   TODAY'S TREATMENT:                                                                                                                              DATE:   02/20/23 -Manual Therapy: myofascial release and trigger point applied to biceps, deltoid, scapularis, trapezius, in order to reduce pain and facial restrictions, as  well as improving ROM.  -AA/ROM: 2lb dowel, standing, flexion, abduction, protraction, horizontal abduction, er/IR, x15 -A/ROM: standing, flexion, abduction, protraction, horizontal abduction, er/IR, x10 -ABC on the Wall - red weighted ball -Functional Reaching: x10 cones, counter to shoulder height, shoulder height to head height, head  height to overhead, up in flexion, down in abduction -Overhead  Lacing  02/17/23 -Manual Therapy: myofascial release and trigger point applied to biceps, deltoid, scapularis, trapezius, in order to reduce pain and facial restrictions, as well as improving ROM.  -AA/ROM: 2lb dowel, standing, flexion, abduction, protraction, horizontal abduction, er/IR, x15 -A/ROM: standing, flexion, abduction, protraction, horizontal abduction, er/IR, x10 -Proximal shoulder exercise: paddles, criss cross, circles both directions, x10 each -Wall Slides: flexion, abduction, x10 -Overhead Lacing  02/13/23 -Manual Therapy: myofascial release and trigger point applied to biceps, deltoid, scapularis, trapezius, in order to reduce pain and facial restrictions, as well as improving ROM.  -P/ROM: flexion, abduction, horizontal abduction, er/IR, x8 -AA/ROM: supine-protraction, flexion, horizontal abduction, er/IR, abduction, x12 -A/ROM: supine, protraction, flexion, horizontal abduction, x6 -Wall Slides: flexion, abduction, x10 -AA/ROM: standing-protraction, flexion, horizontal abduction, er/IR, abduction, 10 reps   PATIENT EDUCATION: Education details: Functional Reaching Person educated: Patient Education method: Explanation and Demonstration Education comprehension: verbalized understanding and returned demonstration  HOME EXERCISE PROGRAM: 7/19: Elbow, wrist, and hand ROM 7/22: Table Slides 8/6: AA/ROM 8/19: A/ROM  GOALS: Goals reviewed with patient? Yes   SHORT TERM GOALS: Target date: 02/08/23  Pt will be provided with and educated on HEP to improve mobility in RUE  required for use during ADL completion.   Goal status: IN PROGRESS  2.  Pt will increase RUE P/ROM by 40 degrees to improve ability to use RUE during dressing tasks with minimal compensatory techniques.   Goal status: IN PROGRESS  3.  Pt will increase RUE strength to 3+/5 to improve ability to reach for items at waist to chest height during bathing and grooming tasks.   Goal status: IN PROGRESS   LONG TERM GOALS: Target date: 03/17/23  Pt will decrease pain in RUE to 3/10 or less to improve ability to sleep for 2+ consecutive hours without waking due to pain.   Goal status: IN PROGRESS  2.  Pt will decrease RUE fascial restrictions to min amounts or less to improve mobility required for functional reaching tasks.   Goal status: IN PROGRESS  3.  Pt will increase RUE A/ROM by 50 degrees to improve ability to use RUE when reaching overhead or behind back during dressing and bathing tasks.   Goal status: IN PROGRESS  4.  Pt will increase RUE strength to 4+/5 or greater to improve ability to use RUE when lifting or carrying items during meal preparation/housework/yardwork tasks.   Goal status: IN PROGRESS  5.  Pt will return to highest level of function using RUE as dominant during functional task completion.   Goal status: IN PROGRESS   ASSESSMENT:  CLINICAL IMPRESSION: This session, pt was reassessed for her progress note. She is making good improvements with her ROM, reaching approximately 65% of full ROM actively. She continues to demonstrate from using AA/ROM to stretch out her shoulder before completing active movements. OT had pt working on functional reaching this session, which she demonstrated with good movement pattern and overall good tolerance. OT providing verbal and tactile cuing throughout session for positioning and technique.   PERFORMANCE DEFICITS: in functional skills including in functional skills including ADLs, IADLs, coordination, tone, ROM, strength, pain,  fascial restrictions, muscle spasms, and UE functional use.   PLAN:  OT FREQUENCY: 2x/week  OT DURATION: 8 weeks  PLANNED INTERVENTIONS: self care/ADL training, therapeutic exercise, therapeutic activity, neuromuscular re-education, manual therapy, passive range of motion, splinting, electrical stimulation, ultrasound, moist heat, cryotherapy, patient/family education, and DME and/or AE instructions  CONSULTED AND AGREED WITH PLAN OF CARE: Patient  PLAN FOR  NEXT SESSION: Manual Therapy, AA/ROM, A/ROM, wall slides    Trish Mage, OTR/L [phone] 02/20/2023, 2:48 PM

## 2023-02-24 ENCOUNTER — Ambulatory Visit (HOSPITAL_COMMUNITY): Payer: Medicare Other | Admitting: Occupational Therapy

## 2023-02-24 ENCOUNTER — Encounter (HOSPITAL_COMMUNITY): Payer: Self-pay | Admitting: Occupational Therapy

## 2023-02-24 DIAGNOSIS — M25511 Pain in right shoulder: Secondary | ICD-10-CM

## 2023-02-24 DIAGNOSIS — M25611 Stiffness of right shoulder, not elsewhere classified: Secondary | ICD-10-CM

## 2023-02-24 DIAGNOSIS — R29898 Other symptoms and signs involving the musculoskeletal system: Secondary | ICD-10-CM | POA: Diagnosis not present

## 2023-02-24 NOTE — Therapy (Signed)
OUTPATIENT OCCUPATIONAL THERAPY ORTHO TREATMENT NOTE   Patient Name: Nicole Newman MRN: [id number] DOB:[birth year], 72 y.o., female Today's Date: 02/24/2023   END OF SESSION:  OT End of Session - 02/24/23 0943     Visit Number 12    Number of Visits 17    Date for OT Re-Evaluation 03/14/23    Authorization Type BCBS Medicare    Progress Note Due on Visit 10    OT Start Time 0900    OT Stop Time 0941    OT Time Calculation (min) 41 min    Activity Tolerance Patient tolerated treatment well    Behavior During Therapy [facility] for tasks assessed/performed             Past Medical History:  Diagnosis Date   Anemia    Arthritis    Depression    Dysrhythmia    PVC'S 2017   Family history of adverse reaction to anesthesia    states mother had small MI during anesthesia   GERD (gastroesophageal reflux disease)    High cholesterol    Hypertension    states under control with med., has been on med. x 2.5 yr.   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   BREAST REDUCTION SURGERY     CHOLECYSTECTOMY N/A 05/06/2022   Procedure: LAPAROSCOPIC CHOLECYSTECTOMY;  Surgeon: Fritzi Mandes, MD;  Location: [facility];  Service: General;  Laterality: N/A;   CHONDROPLASTY Left 02/27/2016   Procedure: CHONDROPLASTY;  Surgeon: Salvatore Marvel, MD;  Location: [facility];  Service: Orthopedics;  Laterality: Left;   DIAGNOSTIC LAPAROSCOPY     ESOPHAGEAL DILATION     ESOPHAGOGASTRODUODENOSCOPY (EGD) WITH PROPOFOL N/A 04/01/2019   Procedure: ESOPHAGOGASTRODUODENOSCOPY (EGD) WITH PROPOFOL;  Surgeon: Carman Ching, MD;  Location: [facility];  Service: Endoscopy;   Laterality: N/A;   EXCISION MORTON'S NEUROMA Right    foot   KNEE ARTHROSCOPY WITH LATERAL MENISECTOMY Left 02/27/2016   Procedure: KNEE ARTHROSCOPY WITH LATERAL and MEDIAL MENISCECTOMY, left;  Surgeon: Salvatore Marvel, MD;  Location: [facility];  Service: Orthopedics;  Laterality: Left;   LAPAROSCOPIC BILATERAL SALPINGO OOPHERECTOMY Bilateral 07/06/2018   Procedure: LAPAROSCOPIC BILATERAL SALPINGO OOPHORECTOMY;  Surgeon: Ward, Elenora Fender, MD;  Location: [facility];  Service: Gynecology;  Laterality: Bilateral;   LAPAROSCOPIC HYSTERECTOMY N/A 07/06/2018   Procedure: HYSTERECTOMY TOTAL LAPAROSCOPIC;  Surgeon: Ward, Elenora Fender, MD;  Location: [facility];  Service: Gynecology;  Laterality: N/A;   REDUCTION MAMMAPLASTY Bilateral    SAVORY DILATION N/A 04/01/2019   Procedure: SAVORY DILATION;  Surgeon: Carman Ching, MD;  Location: [facility];  Service: Endoscopy;  Laterality: N/A;   SKIN CANCER EXCISION     Patient Active Problem List   Diagnosis Date Noted   Hypertension    GERD (gastroesophageal reflux disease)    Family history of adverse reaction to anesthesia    Medial meniscus tear 01/30/2016   Lateral meniscal tear left knee 01/30/2016   Palpitations 07/31/2015   Ventricular premature beats 07/31/2015   Tachycardia, unspecified 07/31/2015    PCP: Margaretann Loveless, MD [facility]  REFERRING PROVIDER: Ramond Marrow, MD  ONSET DATE: 01/15/23  REFERRING DIAG: R Reverse Total Shoulder  THERAPY DIAG:  Acute pain of right shoulder  Other symptoms and signs involving the musculoskeletal system  Stiffness of right shoulder, not elsewhere classified  Rationale for Evaluation and Treatment: Rehabilitation  SUBJECTIVE:   SUBJECTIVE STATEMENT: S: "I'm a little sore right now"  PERTINENT HISTORY: Pt had a fall 2 years ago resulting in torn ligaments and increased pain/lack of mobility. Pt had right reverse TSA on 01/15/23.   PRECAUTIONS: Shoulder-see protocol  WEIGHT  BEARING RESTRICTIONS: Yes >1lb  PAIN:  Are you having pain? No  FALLS: Has patient fallen in last 6 months? Yes. Number of falls 1  PLOF: Independent  PATIENT GOALS: "I want my arm to be back at 100%."  NEXT MD VISIT: 02/14/23  OBJECTIVE:   HAND DOMINANCE: Right  ADLs: Overall ADLs: Pt is unable to dress or bath independently, requiring mod to max assist due to weakness, pain, and limitations of the RUE. Additionally at this time she is requiring total assist with all cooking, cleaning, and yard work.   FUNCTIONAL OUTCOME MEASURES: FOTO: Will complete next session  UPPER EXTREMITY ROM:       Assessed in supine, er/IR adducted  Passive ROM Right eval Right 02/06/23  Shoulder flexion 120 129  Shoulder abduction 87 95  Shoulder internal rotation 90 90  Shoulder external rotation 17 20  (Blank rows = not tested)     Active ROM Right 02/20/23  Shoulder flexion 110  Shoulder abduction 98  Shoulder internal rotation 90  Shoulder external rotation 44  (Blank rows = not tested)  UPPER EXTREMITY MMT:     Assessed in seated, er/IR adducted  MMT Right eval  Shoulder flexion   Shoulder abduction   Shoulder internal rotation   Shoulder external rotation   Middle trapezius   Lower trapezius   (Blank rows = not tested)  SENSATION: Pt reports that her thumb is numb  EDEMA: Swelling noted in the elbow  OBSERVATIONS: mod to severe fascial restrictions along the biceps, deltoid, scapularis, and pectoralis   TODAY'S TREATMENT:                                                                                                                              DATE:   02/24/23 -Manual Therapy: myofascial release and trigger point applied to biceps, deltoid, scapularis, trapezius, in order to reduce pain and facial restrictions, as well as improving ROM.  -AA/ROM: 3lb dowel, standing, flexion, abduction, protraction, horizontal abduction, er/IR, x15 -A/ROM: standing, flexion,  abduction, protraction, horizontal abduction, er/IR, x10 -Ball Rolls on the wall: flexion, abduction, x10 -Therapy Ball Exercises: flexion, protraction, overhead press, V ups, Circles both directions, x10  -UBE: level 2, 2.5' forwards and backwards, 3.5+ pace  02/20/23 -Manual Therapy: myofascial release and trigger point applied to biceps, deltoid, scapularis, trapezius, in order to reduce pain and facial restrictions, as well as  improving ROM.  -AA/ROM: 2lb dowel, standing, flexion, abduction, protraction, horizontal abduction, er/IR, x15 -A/ROM: standing, flexion, abduction, protraction, horizontal abduction, er/IR, x10 -ABC on the Wall - red weighted ball -Functional Reaching: x10 cones, counter to shoulder height, shoulder height to head height, head  height to overhead, up in flexion, down in abduction -Overhead Lacing  02/17/23 -Manual Therapy: myofascial release and trigger point applied to biceps, deltoid, scapularis, trapezius, in order to reduce pain and facial restrictions, as well as improving ROM.  -AA/ROM: 2lb dowel, standing, flexion, abduction, protraction, horizontal abduction, er/IR, x15 -A/ROM: standing, flexion, abduction, protraction, horizontal abduction, er/IR, x10 -Proximal shoulder exercise: paddles, criss cross, circles both directions, x10 each -Wall Slides: flexion, abduction, x10 -Overhead Lacing    PATIENT EDUCATION: Education details:Continue HEP Person educated: Patient Education method: Medical illustrator Education comprehension: verbalized understanding and returned demonstration  HOME EXERCISE PROGRAM: 7/19: Elbow, wrist, and hand ROM 7/22: Table Slides 8/6: AA/ROM 8/19: A/ROM  GOALS: Goals reviewed with patient? Yes   SHORT TERM GOALS: Target date: 02/08/23  Pt will be provided with and educated on HEP to improve mobility in RUE required for use during ADL completion.   Goal status: IN PROGRESS  2.  Pt will increase RUE P/ROM  by 40 degrees to improve ability to use RUE during dressing tasks with minimal compensatory techniques.   Goal status: IN PROGRESS  3.  Pt will increase RUE strength to 3+/5 to improve ability to reach for items at waist to chest height during bathing and grooming tasks.   Goal status: IN PROGRESS   LONG TERM GOALS: Target date: 03/17/23  Pt will decrease pain in RUE to 3/10 or less to improve ability to sleep for 2+ consecutive hours without waking due to pain.   Goal status: IN PROGRESS  2.  Pt will decrease RUE fascial restrictions to min amounts or less to improve mobility required for functional reaching tasks.   Goal status: IN PROGRESS  3.  Pt will increase RUE A/ROM by 50 degrees to improve ability to use RUE when reaching overhead or behind back during dressing and bathing tasks.   Goal status: IN PROGRESS  4.  Pt will increase RUE strength to 4+/5 or greater to improve ability to use RUE when lifting or carrying items during meal preparation/housework/yardwork tasks.   Goal status: IN PROGRESS  5.  Pt will return to highest level of function using RUE as dominant during functional task completion.   Goal status: IN PROGRESS   ASSESSMENT:  CLINICAL IMPRESSION: This session, pt continuing to demonstrate good and improving ROM, as well as functional strength. She continues to have good follow through with all of her HEP daily. This session, OT added therapy ball exercises to continue working on functional strength without resistance or heavy weights. She did report increased fatigued with new exercises but did not require any rest breaks. OT providing verbal and tactile cuing throughout session for positioning and technique, including to limit shoulder hiking.   PERFORMANCE DEFICITS: in functional skills including in functional skills including ADLs, IADLs, coordination, tone, ROM, strength, pain, fascial restrictions, muscle spasms, and UE functional use.   PLAN:  OT  FREQUENCY: 2x/week  OT DURATION: 8 weeks  PLANNED INTERVENTIONS: self care/ADL training, therapeutic exercise, therapeutic activity, neuromuscular re-education, manual therapy, passive range of motion, splinting, electrical stimulation, ultrasound, moist heat, cryotherapy, patient/family education, and DME and/or AE instructions  CONSULTED AND AGREED WITH PLAN OF CARE: Patient  PLAN FOR NEXT SESSION: Manual  Therapy, AA/ROM, A/ROM, wall slides    Trish Mage, OTR/L [phone] 02/24/2023, 9:43 AM

## 2023-02-27 ENCOUNTER — Encounter (HOSPITAL_COMMUNITY): Payer: Medicare Other | Admitting: Occupational Therapy

## 2023-03-05 ENCOUNTER — Ambulatory Visit (HOSPITAL_COMMUNITY): Payer: Medicare Other | Attending: Physician Assistant | Admitting: Occupational Therapy

## 2023-03-05 ENCOUNTER — Encounter (HOSPITAL_COMMUNITY): Payer: Self-pay | Admitting: Occupational Therapy

## 2023-03-05 DIAGNOSIS — R29898 Other symptoms and signs involving the musculoskeletal system: Secondary | ICD-10-CM | POA: Diagnosis not present

## 2023-03-05 DIAGNOSIS — M25511 Pain in right shoulder: Secondary | ICD-10-CM | POA: Diagnosis not present

## 2023-03-05 DIAGNOSIS — M25611 Stiffness of right shoulder, not elsewhere classified: Secondary | ICD-10-CM | POA: Diagnosis not present

## 2023-03-05 NOTE — Therapy (Unsigned)
OUTPATIENT OCCUPATIONAL THERAPY ORTHO TREATMENT NOTE   Patient Name: Nicole Newman MRN: [id number] DOB:October 02, 1950, 72 y.o., female Today's Date: 03/06/2023   END OF SESSION:  OT End of Session - 03/05/23 1641     Visit Number 13    Number of Visits 17    Date for OT Re-Evaluation 03/14/23    Authorization Type BCBS Medicare    Progress Note Due on Visit 10    OT Start Time 1601    OT Stop Time 1641    OT Time Calculation (min) 40 min    Activity Tolerance Patient tolerated treatment well    Behavior During Therapy [facility] for tasks assessed/performed             Past Medical History:  Diagnosis Date   Anemia    Arthritis    Depression    Dysrhythmia    PVC'S 2017   Family history of adverse reaction to anesthesia    states mother had small MI during anesthesia   GERD (gastroesophageal reflux disease)    High cholesterol    Hypertension    states under control with med., has been on med. x 2.5 yr.   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   BREAST REDUCTION SURGERY     CHOLECYSTECTOMY N/A 05/06/2022   Procedure: LAPAROSCOPIC CHOLECYSTECTOMY;  Surgeon: Fritzi Mandes, MD;  Location: [facility];  Service: General;  Laterality: N/A;   CHONDROPLASTY Left 02/27/2016   Procedure: CHONDROPLASTY;  Surgeon: Salvatore Marvel, MD;  Location: [facility];  Service: Orthopedics;  Laterality: Left;   DIAGNOSTIC LAPAROSCOPY     ESOPHAGEAL DILATION     ESOPHAGOGASTRODUODENOSCOPY (EGD) WITH PROPOFOL N/A 04/01/2019   Procedure: ESOPHAGOGASTRODUODENOSCOPY (EGD) WITH PROPOFOL;  Surgeon: Carman Ching, MD;  Location: [facility];  Service: Endoscopy;   Laterality: N/A;   EXCISION MORTON'S NEUROMA Right    foot   KNEE ARTHROSCOPY WITH LATERAL MENISECTOMY Left 02/27/2016   Procedure: KNEE ARTHROSCOPY WITH LATERAL and MEDIAL MENISCECTOMY, left;  Surgeon: Salvatore Marvel, MD;  Location: [facility];  Service: Orthopedics;  Laterality: Left;   LAPAROSCOPIC BILATERAL SALPINGO OOPHERECTOMY Bilateral 07/06/2018   Procedure: LAPAROSCOPIC BILATERAL SALPINGO OOPHORECTOMY;  Surgeon: Ward, Elenora Fender, MD;  Location: [facility];  Service: Gynecology;  Laterality: Bilateral;   LAPAROSCOPIC HYSTERECTOMY N/A 07/06/2018   Procedure: HYSTERECTOMY TOTAL LAPAROSCOPIC;  Surgeon: Ward, Elenora Fender, MD;  Location: [facility];  Service: Gynecology;  Laterality: N/A;   REDUCTION MAMMAPLASTY Bilateral    SAVORY DILATION N/A 04/01/2019   Procedure: SAVORY DILATION;  Surgeon: Carman Ching, MD;  Location: [facility];  Service: Endoscopy;  Laterality: N/A;   SKIN CANCER EXCISION     Patient Active Problem List   Diagnosis Date Noted   Hypertension    GERD (gastroesophageal reflux disease)    Family history of adverse reaction to anesthesia    Medial meniscus tear 01/30/2016   Lateral meniscal tear left knee 01/30/2016   Palpitations 07/31/2015   Ventricular premature beats 07/31/2015   Tachycardia, unspecified 07/31/2015    PCP: Margaretann Loveless, MD [facility]  REFERRING PROVIDER: Ramond Marrow, MD  ONSET DATE: 01/15/23  REFERRING DIAG: R Reverse Total Shoulder  THERAPY DIAG:  Acute pain of right shoulder  Other symptoms and signs involving the musculoskeletal system  Stiffness of right shoulder, not elsewhere classified  Rationale for Evaluation and Treatment: Rehabilitation  SUBJECTIVE:   SUBJECTIVE STATEMENT: S: "I'm a little sore right now"  PERTINENT HISTORY: Pt had a fall 2 years ago resulting in torn ligaments and increased pain/lack of mobility. Pt had right reverse TSA on 01/15/23.   PRECAUTIONS: Shoulder-see protocol  WEIGHT  BEARING RESTRICTIONS: Yes >1lb  PAIN:  Are you having pain? No  FALLS: Has patient fallen in last 6 months? Yes. Number of falls 1  PLOF: Independent  PATIENT GOALS: "I want my arm to be back at 100%."  NEXT MD VISIT: 02/14/23  OBJECTIVE:   HAND DOMINANCE: Right  ADLs: Overall ADLs: Pt is unable to dress or bath independently, requiring mod to max assist due to weakness, pain, and limitations of the RUE. Additionally at this time she is requiring total assist with all cooking, cleaning, and yard work.   FUNCTIONAL OUTCOME MEASURES: FOTO: Will complete next session  UPPER EXTREMITY ROM:       Assessed in supine, er/IR adducted  Passive ROM Right eval Right 02/06/23  Shoulder flexion 120 129  Shoulder abduction 87 95  Shoulder internal rotation 90 90  Shoulder external rotation 17 20  (Blank rows = not tested)     Active ROM Right 02/20/23  Shoulder flexion 110  Shoulder abduction 98  Shoulder internal rotation 90  Shoulder external rotation 44  (Blank rows = not tested)  UPPER EXTREMITY MMT:     Assessed in seated, er/IR adducted  MMT Right eval  Shoulder flexion   Shoulder abduction   Shoulder internal rotation   Shoulder external rotation   Middle trapezius   Lower trapezius   (Blank rows = not tested)  SENSATION: Pt reports that her thumb is numb  EDEMA: Swelling noted in the elbow  OBSERVATIONS: mod to severe fascial restrictions along the biceps, deltoid, scapularis, and pectoralis   TODAY'S TREATMENT:                                                                                                                              DATE:   03/05/23 -Manual Therapy: myofascial release and trigger point applied to biceps, deltoid, scapularis, trapezius, in order to reduce pain and facial restrictions, as well as improving ROM.  -A/ROM: standing, flexion, abduction, protraction, horizontal abduction, er/IR, x12 -[facility]: flexion, abduction,  x12 -Scapular ROM: elevation/depression, retraction/protraction, x10 -Scapular Strengthening: red band, extension, retraction, protraction, rows, x12 -Therapy Ball Exercises: flexion, protraction, overhead press, V ups, Circles both directions, x10  -UBE: level 3, 2.5' forwards and backwards, 3.5+ pace  02/24/23 -Manual Therapy: myofascial release and trigger point applied to biceps, deltoid, scapularis, trapezius, in order to reduce pain and facial restrictions, as well as  improving ROM.  -AA/ROM: 3lb dowel, standing, flexion, abduction, protraction, horizontal abduction, er/IR, x15 -A/ROM: standing, flexion, abduction, protraction, horizontal abduction, er/IR, x10 -Ball Rolls on the wall: flexion, abduction, x10 -Therapy Ball Exercises: flexion, protraction, overhead press, V ups, Circles both directions, x10  -UBE: level 2, 2.5' forwards and backwards, 3.5+ pace  02/20/23 -Manual Therapy: myofascial release and trigger point applied to biceps, deltoid, scapularis, trapezius, in order to reduce pain and facial restrictions, as well as improving ROM.  -AA/ROM: 2lb dowel, standing, flexion, abduction, protraction, horizontal abduction, er/IR, x15 -A/ROM: standing, flexion, abduction, protraction, horizontal abduction, er/IR, x10 -ABC on the Wall - red weighted ball -Functional Reaching: x10 cones, counter to shoulder height, shoulder height to head height, head  height to overhead, up in flexion, down in abduction -Overhead Lacing    PATIENT EDUCATION: Education details: Publishing rights manager Person educated: Patient Education method: Medical illustrator Education comprehension: verbalized understanding and returned demonstration  HOME EXERCISE PROGRAM: 7/19: Elbow, wrist, and hand ROM 7/22: Table Slides 8/6: AA/ROM 8/19: A/ROM 9/4: Scapular Strengthening  GOALS: Goals reviewed with patient? Yes   SHORT TERM GOALS: Target date: 02/08/23  Pt will be provided with and  educated on HEP to improve mobility in RUE required for use during ADL completion.   Goal status: IN PROGRESS  2.  Pt will increase RUE P/ROM by 40 degrees to improve ability to use RUE during dressing tasks with minimal compensatory techniques.   Goal status: IN PROGRESS  3.  Pt will increase RUE strength to 3+/5 to improve ability to reach for items at waist to chest height during bathing and grooming tasks.   Goal status: IN PROGRESS   LONG TERM GOALS: Target date: 03/17/23  Pt will decrease pain in RUE to 3/10 or less to improve ability to sleep for 2+ consecutive hours without waking due to pain.   Goal status: IN PROGRESS  2.  Pt will decrease RUE fascial restrictions to min amounts or less to improve mobility required for functional reaching tasks.   Goal status: IN PROGRESS  3.  Pt will increase RUE A/ROM by 50 degrees to improve ability to use RUE when reaching overhead or behind back during dressing and bathing tasks.   Goal status: IN PROGRESS  4.  Pt will increase RUE strength to 4+/5 or greater to improve ability to use RUE when lifting or carrying items during meal preparation/housework/yardwork tasks.   Goal status: IN PROGRESS  5.  Pt will return to highest level of function using RUE as dominant during functional task completion.   Goal status: IN PROGRESS   ASSESSMENT:  CLINICAL IMPRESSION: Pt starting theraband exercises this session, where she is demonstrating good movement pattern and control. With no movements, pt reporting mild pain and fatigue, however she is able to complete all exercises with no rest break this session. Her ROM is approximately 75% of full A/ROM. OT providing verbal and tactile cuing for positioning and technique, as well as cuing to reduce shoulder hiking.   PERFORMANCE DEFICITS: in functional skills including in functional skills including ADLs, IADLs, coordination, tone, ROM, strength, pain, fascial restrictions, muscle spasms, and  UE functional use.   PLAN:  OT FREQUENCY: 2x/week  OT DURATION: 8 weeks  PLANNED INTERVENTIONS: self care/ADL training, therapeutic exercise, therapeutic activity, neuromuscular re-education, manual therapy, passive range of motion, splinting, electrical stimulation, ultrasound, moist heat, cryotherapy, patient/family education, and DME and/or AE instructions  CONSULTED AND AGREED WITH PLAN OF CARE: Patient  PLAN FOR  NEXT SESSION: Manual Therapy, AA/ROM, A/ROM, wall slides    Trish Mage, OTR/L [phone] 03/06/2023, 9:51 PM

## 2023-03-05 NOTE — Patient Instructions (Signed)

## 2023-03-07 ENCOUNTER — Ambulatory Visit (HOSPITAL_COMMUNITY): Payer: Medicare Other | Admitting: Occupational Therapy

## 2023-03-07 ENCOUNTER — Encounter (HOSPITAL_COMMUNITY): Payer: Self-pay | Admitting: Occupational Therapy

## 2023-03-07 DIAGNOSIS — M25511 Pain in right shoulder: Secondary | ICD-10-CM | POA: Diagnosis not present

## 2023-03-07 DIAGNOSIS — M25611 Stiffness of right shoulder, not elsewhere classified: Secondary | ICD-10-CM | POA: Diagnosis not present

## 2023-03-07 DIAGNOSIS — R29898 Other symptoms and signs involving the musculoskeletal system: Secondary | ICD-10-CM

## 2023-03-07 NOTE — Therapy (Signed)
OUTPATIENT OCCUPATIONAL THERAPY ORTHO TREATMENT NOTE   Patient Name: Nicole Newman MRN: [id number] DOB:Feb 24, 1951, 72 y.o., female Today's Date: 03/07/2023   END OF SESSION:  OT End of Session - 03/07/23 0946     Visit Number 14    Number of Visits 17    Date for OT Re-Evaluation 03/14/23    Authorization Type BCBS Medicare    Progress Note Due on Visit 10    OT Start Time 0907    OT Stop Time 0945    OT Time Calculation (min) 38 min    Activity Tolerance Patient tolerated treatment well    Behavior During Therapy [facility] for tasks assessed/performed              Past Medical History:  Diagnosis Date   Anemia    Arthritis    Depression    Dysrhythmia    PVC'S 2017   Family history of adverse reaction to anesthesia    states mother had small MI during anesthesia   GERD (gastroesophageal reflux disease)    High cholesterol    Hypertension    states under control with med., has been on med. x 2.5 yr.   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   BREAST REDUCTION SURGERY     CHOLECYSTECTOMY N/A 05/06/2022   Procedure: LAPAROSCOPIC CHOLECYSTECTOMY;  Surgeon: Fritzi Mandes, MD;  Location: [facility];  Service: General;  Laterality: N/A;   CHONDROPLASTY Left 02/27/2016   Procedure: CHONDROPLASTY;  Surgeon: Salvatore Marvel, MD;  Location: [facility];  Service: Orthopedics;  Laterality: Left;   DIAGNOSTIC LAPAROSCOPY     ESOPHAGEAL DILATION     ESOPHAGOGASTRODUODENOSCOPY (EGD) WITH PROPOFOL N/A 04/01/2019   Procedure: ESOPHAGOGASTRODUODENOSCOPY (EGD) WITH PROPOFOL;  Surgeon: Carman Ching, MD;  Location: [facility];  Service:  Endoscopy;  Laterality: N/A;   EXCISION MORTON'S NEUROMA Right    foot   KNEE ARTHROSCOPY WITH LATERAL MENISECTOMY Left 02/27/2016   Procedure: KNEE ARTHROSCOPY WITH LATERAL and MEDIAL MENISCECTOMY, left;  Surgeon: Salvatore Marvel, MD;  Location: [facility];  Service: Orthopedics;  Laterality: Left;   LAPAROSCOPIC BILATERAL SALPINGO OOPHERECTOMY Bilateral 07/06/2018   Procedure: LAPAROSCOPIC BILATERAL SALPINGO OOPHORECTOMY;  Surgeon: Ward, Elenora Fender, MD;  Location: [facility];  Service: Gynecology;  Laterality: Bilateral;   LAPAROSCOPIC HYSTERECTOMY N/A 07/06/2018   Procedure: HYSTERECTOMY TOTAL LAPAROSCOPIC;  Surgeon: Ward, Elenora Fender, MD;  Location: [facility];  Service: Gynecology;  Laterality: N/A;   REDUCTION MAMMAPLASTY Bilateral    SAVORY DILATION N/A 04/01/2019   Procedure: SAVORY DILATION;  Surgeon: Carman Ching, MD;  Location: [facility];  Service: Endoscopy;  Laterality: N/A;   SKIN CANCER EXCISION     Patient Active Problem List   Diagnosis Date Noted   Hypertension    GERD (gastroesophageal reflux disease)    Family history of adverse reaction to anesthesia    Medial meniscus tear 01/30/2016   Lateral meniscal tear left knee 01/30/2016   Palpitations 07/31/2015   Ventricular premature beats 07/31/2015   Tachycardia, unspecified 07/31/2015    PCP: Margaretann Loveless, MD [facility]  [facility] REFERRING PROVIDER: Ramond Marrow, MD  ONSET DATE: 01/15/23  REFERRING DIAG: R Reverse Total Shoulder  THERAPY DIAG:  Acute pain of right shoulder  Stiffness of right shoulder, not elsewhere classified  Other symptoms and signs involving the musculoskeletal system  Rationale for Evaluation and Treatment: Rehabilitation  SUBJECTIVE:   SUBJECTIVE STATEMENT: S: "I'm a little sore right now"  PERTINENT HISTORY: Pt had a fall 2 years ago resulting in torn ligaments and increased pain/lack of mobility. Pt had right reverse TSA on 01/15/23.   PRECAUTIONS: Shoulder-see  protocol  WEIGHT BEARING RESTRICTIONS: Yes >1lb  PAIN:  Are you having pain? No  FALLS: Has patient fallen in last 6 months? Yes. Number of falls 1  PLOF: Independent  PATIENT GOALS: "I want my arm to be back at 100%."  NEXT MD VISIT: 02/14/23  OBJECTIVE:   HAND DOMINANCE: Right  ADLs: Overall ADLs: Pt is unable to dress or bath independently, requiring mod to max assist due to weakness, pain, and limitations of the RUE. Additionally at this time she is requiring total assist with all cooking, cleaning, and yard work.   FUNCTIONAL OUTCOME MEASURES: FOTO: Will complete next session  UPPER EXTREMITY ROM:       Assessed in supine, er/IR adducted  Passive ROM Right eval Right 02/06/23  Shoulder flexion 120 129  Shoulder abduction 87 95  Shoulder internal rotation 90 90  Shoulder external rotation 17 20  (Blank rows = not tested)     Active ROM Right 02/20/23  Shoulder flexion 110  Shoulder abduction 98  Shoulder internal rotation 90  Shoulder external rotation 44  (Blank rows = not tested)  UPPER EXTREMITY MMT:     Assessed in seated, er/IR adducted  MMT Right eval  Shoulder flexion   Shoulder abduction   Shoulder internal rotation   Shoulder external rotation   Middle trapezius   Lower trapezius   (Blank rows = not tested)  SENSATION: Pt reports that her thumb is numb  EDEMA: Swelling noted in the elbow  OBSERVATIONS: mod to severe fascial restrictions along the biceps, deltoid, scapularis, and pectoralis   TODAY'S TREATMENT:                                                                                                                              DATE:   03/07/23 -Manual Therapy: myofascial release and trigger point applied to biceps, deltoid, scapularis, trapezius, in order to reduce pain and facial restrictions, as well as improving ROM.  -A/ROM: standing, flexion, abduction, protraction, horizontal abduction, er/IR, x12 -Proximal shoulder  exercises: paddles, criss cross, circles both directions, 2x12 each -ABC's on the wall -Loop Band exercises: green band, wall taps, V ups, Wall Clocks, x12 -Scapular Strengthening: green band, extension, retraction, protraction, rows, x12  03/05/23 -Manual Therapy: myofascial release and trigger point applied to biceps, deltoid, scapularis, trapezius, in order to reduce pain and facial restrictions, as well as improving ROM.  -A/ROM: standing,  flexion, abduction, protraction, horizontal abduction, er/IR, x12 -Ball Rolls: flexion, abduction, x12 -Scapular ROM: elevation/depression, retraction/protraction, x10 -Scapular Strengthening: red band, extension, retraction, protraction, rows, x12 -Therapy Ball Exercises: flexion, protraction, overhead press, V ups, Circles both directions, x10  -UBE: level 3, 2.5' forwards and backwards, 3.5+ pace  02/24/23 -Manual Therapy: myofascial release and trigger point applied to biceps, deltoid, scapularis, trapezius, in order to reduce pain and facial restrictions, as well as improving ROM.  -AA/ROM: 3lb dowel, standing, flexion, abduction, protraction, horizontal abduction, er/IR, x15 -A/ROM: standing, flexion, abduction, protraction, horizontal abduction, er/IR, x10 -Ball Rolls on the wall: flexion, abduction, x10 -Therapy Ball Exercises: flexion, protraction, overhead press, V ups, Circles both directions, x10  -UBE: level 2, 2.5' forwards and backwards, 3.5+ pace    PATIENT EDUCATION: Education details: loop band exercises Person educated: Patient Education method: Explanation and Demonstration Education comprehension: verbalized understanding and returned demonstration  HOME EXERCISE PROGRAM: 7/19: Elbow, wrist, and hand ROM 7/22: Table Slides 8/6: AA/ROM 8/19: A/ROM 9/4: Scapular Strengthening 9/6: Loop band exercises  GOALS: Goals reviewed with patient? Yes   SHORT TERM GOALS: Target date: 02/08/23  Pt will be provided with and  educated on HEP to improve mobility in RUE required for use during ADL completion.   Goal status: IN PROGRESS  2.  Pt will increase RUE P/ROM by 40 degrees to improve ability to use RUE during dressing tasks with minimal compensatory techniques.   Goal status: IN PROGRESS  3.  Pt will increase RUE strength to 3+/5 to improve ability to reach for items at waist to chest height during bathing and grooming tasks.   Goal status: IN PROGRESS   LONG TERM GOALS: Target date: 03/17/23  Pt will decrease pain in RUE to 3/10 or less to improve ability to sleep for 2+ consecutive hours without waking due to pain.   Goal status: IN PROGRESS  2.  Pt will decrease RUE fascial restrictions to min amounts or less to improve mobility required for functional reaching tasks.   Goal status: IN PROGRESS  3.  Pt will increase RUE A/ROM by 50 degrees to improve ability to use RUE when reaching overhead or behind back during dressing and bathing tasks.   Goal status: IN PROGRESS  4.  Pt will increase RUE strength to 4+/5 or greater to improve ability to use RUE when lifting or carrying items during meal preparation/housework/yardwork tasks.   Goal status: IN PROGRESS  5.  Pt will return to highest level of function using RUE as dominant during functional task completion.   Goal status: IN PROGRESS   ASSESSMENT:  CLINICAL IMPRESSION: This session, pt is making good improvements with all mobility. She tolerated proximal shoulder exercises this session with min to mod fatigue. This session she required multiple short rest breaks due to muscle fatigue. OT added loop band exercises for continued shoulder blade/scapular strengthening and stability, as well as increasing scapular strengthening to green band from red. Verbal and visual cuing provided throughout session for positioning and technique.   PERFORMANCE DEFICITS: in functional skills including in functional skills including ADLs, IADLs,  coordination, tone, ROM, strength, pain, fascial restrictions, muscle spasms, and UE functional use.   PLAN:  OT FREQUENCY: 2x/week  OT DURATION: 8 weeks  PLANNED INTERVENTIONS: self care/ADL training, therapeutic exercise, therapeutic activity, neuromuscular re-education, manual therapy, passive range of motion, splinting, electrical stimulation, ultrasound, moist heat, cryotherapy, patient/family education, and DME and/or AE instructions  CONSULTED AND AGREED WITH PLAN OF CARE: Patient  PLAN  FOR NEXT SESSION: Manual Therapy, AA/ROM, A/ROM, wall slides    Trish Mage, OTR/L [phone] 03/07/2023, 9:49 AM

## 2023-03-07 NOTE — Patient Instructions (Signed)
Complete 10-15 repetitions each. Complete 2 time a day.  Wall taps with theraband  With a looped elastic band around forearms/wrists and arms at a 90 angle pressed against the wall. Keeping elbows on the wall, tap right arm out to the right. Hold for 1 second. Return right arm back to neutral. Repeat with left arm.    Wall V slides with Theraband  Place a band loop around hands/forearms and face a wall. Extend both arms diagonally into a V shape on the wall. Hold this stretch for specified amount of time. Lower arms slowly back into neutral position. Repeat.       scap clocks  Tie a loop with a theraband and place around your wrists.  Stand with a wall in front of you.  Picture a clock in front of you.  Place both palms on the wall, arms straight.  While keeping the left/right hand planted, use the right/left hand to pull away and tap each number (1, 3, 5- right OR 11,9,7 left), coming back to center each time.

## 2023-03-17 ENCOUNTER — Ambulatory Visit (HOSPITAL_COMMUNITY): Payer: Medicare Other | Admitting: Occupational Therapy

## 2023-03-17 ENCOUNTER — Encounter (HOSPITAL_COMMUNITY): Payer: Self-pay | Admitting: Occupational Therapy

## 2023-03-17 DIAGNOSIS — M25511 Pain in right shoulder: Secondary | ICD-10-CM

## 2023-03-17 DIAGNOSIS — M25611 Stiffness of right shoulder, not elsewhere classified: Secondary | ICD-10-CM | POA: Diagnosis not present

## 2023-03-17 DIAGNOSIS — R29898 Other symptoms and signs involving the musculoskeletal system: Secondary | ICD-10-CM | POA: Diagnosis not present

## 2023-03-17 NOTE — Patient Instructions (Signed)
  1) Flexion Wall Stretch    Face wall, place affected handon wall in front of you. Slide hand up the wall  and lean body in towards the wall. Hold for 10 seconds. Repeat 3-5 times. 1-2 times/day.     2) Towel Stretch with Internal Rotation   Or     Gently pull up (or to the side) your affected arm  behind your back with the assist of a towel. Hold 10 seconds, repeat 3-5 times. 1-2 times/day.   4) Posterior Capsule Stretch    Bring the involved arm across chest. Grasp elbow and pull toward chest until you feel a stretch in the back of the upper arm and shoulder. Hold 10 seconds. Repeat 3-5X. Complete 1-2 times/day.    5) Scapular Retraction    Tuck chin back as you pinch shoulder blades together.  Hold 5 seconds. Repeat 3-5X. Complete 1-2 times/day.    6) External Rotation Stretch:     Place your affected hand on the wall with the elbow bent and gently turn your body the opposite direction until a stretch is felt. Hold 10 seconds, repeat 3-5X. Complete 1-2 times/day.      Standing in an open doorway, place your arm on the edge of the doorway with the shoulder at 90 degrees from the side and elbow bent to 90 degrees. Lean forward until you feel a stretch on the front of your shoulder. Keep neck relaxed. Hold 10 seconds, repeat 3-5X. Complete 1-2 times/day

## 2023-03-17 NOTE — Therapy (Unsigned)
OUTPATIENT OCCUPATIONAL THERAPY ORTHO TREATMENT NOTE   Patient Name: Nicole Newman MRN: [id number] DOB:1950-12-17, 72 y.o., female Today's Date: 03/18/2023   END OF SESSION:    03/17/23 1345  OT Visits / Re-Eval  Visit Number 15  Number of Visits 17  Date for OT Re-Evaluation 03/14/23  Authorization  Authorization Type [employer] Medicare  Progress Note Due on Visit 10  OT Time Calculation  OT Start Time 1306  OT Stop Time 1345  OT Time Calculation (min) 39 min  End of Session  Activity Tolerance Patient tolerated treatment well  Behavior During Therapy WFL for tasks assessed/performed    Past Medical History:  Diagnosis Date   Anemia    Arthritis    Depression    Dysrhythmia    PVC'S 2017   Family history of adverse reaction to anesthesia    states mother had small MI during anesthesia   GERD (gastroesophageal reflux disease)    High cholesterol    Hypertension    states under control with med., has been on med. x 2.5 yr.   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   BREAST REDUCTION SURGERY     CHOLECYSTECTOMY N/A 05/06/2022   Procedure: LAPAROSCOPIC CHOLECYSTECTOMY;  Surgeon: Fritzi Mandes, MD;  Location: [facility];  Service: General;  Laterality: N/A;   CHONDROPLASTY Left 02/27/2016   Procedure: CHONDROPLASTY;  Surgeon: Salvatore Marvel, MD;  Location: [facility];  Service: Orthopedics;  Laterality: Left;   DIAGNOSTIC LAPAROSCOPY     ESOPHAGEAL DILATION     ESOPHAGOGASTRODUODENOSCOPY (EGD) WITH PROPOFOL N/A 04/01/2019   Procedure: ESOPHAGOGASTRODUODENOSCOPY (EGD) WITH PROPOFOL;  Surgeon: Carman Ching, MD;  Location: [facility];  Service: Endoscopy;  Laterality: N/A;   EXCISION MORTON'S NEUROMA Right    foot   KNEE ARTHROSCOPY WITH LATERAL MENISECTOMY Left 02/27/2016   Procedure: KNEE ARTHROSCOPY WITH LATERAL and MEDIAL MENISCECTOMY, left;  Surgeon: Salvatore Marvel, MD;  Location: [facility];  Service: Orthopedics;  Laterality: Left;   LAPAROSCOPIC BILATERAL SALPINGO OOPHERECTOMY Bilateral 07/06/2018   Procedure: LAPAROSCOPIC BILATERAL SALPINGO OOPHORECTOMY;  Surgeon: Ward, Elenora Fender, MD;  Location: [facility];  Service: Gynecology;  Laterality: Bilateral;   LAPAROSCOPIC HYSTERECTOMY N/A 07/06/2018   Procedure: HYSTERECTOMY TOTAL LAPAROSCOPIC;  Surgeon: Ward, Elenora Fender, MD;  Location: [facility];  Service: Gynecology;  Laterality: N/A;   REDUCTION MAMMAPLASTY Bilateral    SAVORY DILATION N/A 04/01/2019   Procedure: SAVORY DILATION;  Surgeon: Carman Ching, MD;  Location: [facility];  Service: Endoscopy;  Laterality: N/A;   SKIN CANCER EXCISION     Patient Active Problem List   Diagnosis Date Noted   Hypertension    GERD (gastroesophageal reflux disease)    Family history of adverse reaction to anesthesia    Medial meniscus tear 01/30/2016   Lateral meniscal tear left knee 01/30/2016   Palpitations 07/31/2015   Ventricular premature beats 07/31/2015   Tachycardia, unspecified 07/31/2015    PCP: Margaretann Loveless, MD - [facility] REFERRING PROVIDER: Ramond Marrow, MD  ONSET DATE: 01/15/23  REFERRING DIAG: R Reverse Total Shoulder  THERAPY  DIAG:  Acute pain of right shoulder  Stiffness of right shoulder, not elsewhere classified  Other symptoms and signs involving the musculoskeletal system  Rationale for Evaluation and Treatment: Rehabilitation  SUBJECTIVE:   SUBJECTIVE STATEMENT: S: "I'm a little sore right now"  PERTINENT HISTORY: Pt had a fall 2 years ago resulting in torn ligaments and increased pain/lack of mobility. Pt had right reverse TSA on 01/15/23.   PRECAUTIONS:  Shoulder-see protocol  WEIGHT BEARING RESTRICTIONS: Yes >1lb  PAIN:  Are you having pain? No  FALLS: Has patient fallen in last 6 months? Yes. Number of falls 1  PLOF: Independent  PATIENT GOALS: "I want my arm to be back at 100%."  NEXT MD VISIT: 02/14/23  OBJECTIVE:   HAND DOMINANCE: Right  ADLs: Overall ADLs: Pt is unable to dress or bath independently, requiring mod to max assist due to weakness, pain, and limitations of the RUE. Additionally at this time she is requiring total assist with all cooking, cleaning, and yard work.   UPPER EXTREMITY ROM:       Assessed in supine, er/IR adducted  Passive ROM Right eval Right 02/06/23  Shoulder flexion 120 129  Shoulder abduction 87 95  Shoulder internal rotation 90 90  Shoulder external rotation 17 20  (Blank rows = not tested)     Active ROM Right 02/20/23 Right 03/17/23  Shoulder flexion 110 122  Shoulder abduction 98 124  Shoulder internal rotation 90 90  Shoulder external rotation 44 47  (Blank rows = not tested)  UPPER EXTREMITY MMT:     Assessed in seated, er/IR adducted  MMT Right eval Right 03/17/23  Shoulder flexion  4+/5  Shoulder abduction  4+/5  Shoulder internal rotation  4+/5  Shoulder external rotation  4+/5  (Blank rows = not tested)  SENSATION: Pt reports that her thumb is numb  EDEMA: Swelling noted in the elbow  OBSERVATIONS: mod to severe fascial restrictions along the biceps, deltoid, scapularis, and pectoralis   TODAY'S TREATMENT:                                                                                                                              DATE:   03/17/23 -A/ROM: standing, flexion, abduction, protraction, horizontal abduction, er/IR, x15 -Stretches: flexion, Bicep on the wall, er on the door, doorway stretch, 3x15" -Shoulder strengthening: red band, horizontal abduction, er, IR, flexion, abduction, x12 -functional reaching: 6- 1lb weights, 4- 2lb weights, counter to  shoulder height to head height to overhead in flexion, down in same order in abduction -Overhead Lacing  03/07/23 -Manual Therapy: myofascial release and trigger point applied to biceps, deltoid, scapularis, trapezius, in order to reduce pain and facial restrictions, as well as improving ROM.  -A/ROM: standing, flexion, abduction, protraction, horizontal abduction, er/IR, x12 -Proximal shoulder exercises: paddles, criss cross, circles both directions, 2x12 each -ABC's on the wall -Loop Band exercises: green band, wall taps, V ups, Wall Clocks, x12 -Scapular Strengthening:  green band, extension, retraction, protraction, rows, x12  03/05/23 -Manual Therapy: myofascial release and trigger point applied to biceps, deltoid, scapularis, trapezius, in order to reduce pain and facial restrictions, as well as improving ROM.  -A/ROM: standing, flexion, abduction, protraction, horizontal abduction, er/IR, x12 -[facility]: flexion, abduction, x12 -Scapular ROM: elevation/depression, retraction/protraction, x10 -Scapular Strengthening: red band, extension, retraction, protraction, rows, x12 -Therapy Ball Exercises: flexion, protraction, overhead press, V ups, Circles both directions, x10  -UBE: level 3, 2.5' forwards and backwards, 3.5+ pace    PATIENT EDUCATION: Education details: stretches Person educated: Patient Education method: Medical illustrator Education comprehension: verbalized understanding and returned demonstration  HOME EXERCISE PROGRAM: 7/19: Elbow, wrist, and hand ROM 7/22: Table Slides 8/6: AA/ROM 8/19: A/ROM 9/4: Scapular Strengthening 9/6: Loop band exercises 9/16: Stretches  GOALS: Goals reviewed with patient? Yes   SHORT TERM GOALS: Target date: 02/08/23  Pt will be provided with and educated on HEP to improve mobility in RUE required for use during ADL completion.   Goal status: IN PROGRESS  2.  Pt will increase RUE P/ROM by 40 degrees to improve ability  to use RUE during dressing tasks with minimal compensatory techniques.   Goal status: IN PROGRESS  3.  Pt will increase RUE strength to 3+/5 to improve ability to reach for items at waist to chest height during bathing and grooming tasks.   Goal status: IN PROGRESS   LONG TERM GOALS: Target date: 03/17/23  Pt will decrease pain in RUE to 3/10 or less to improve ability to sleep for 2+ consecutive hours without waking due to pain.   Goal status: IN PROGRESS  2.  Pt will decrease RUE fascial restrictions to min amounts or less to improve mobility required for functional reaching tasks.   Goal status: IN PROGRESS  3.  Pt will increase RUE A/ROM by 50 degrees to improve ability to use RUE when reaching overhead or behind back during dressing and bathing tasks.   Goal status: IN PROGRESS  4.  Pt will increase RUE strength to 4+/5 or greater to improve ability to use RUE when lifting or carrying items during meal preparation/housework/yardwork tasks.   Goal status: IN PROGRESS  5.  Pt will return to highest level of function using RUE as dominant during functional task completion.   Goal status: IN PROGRESS   ASSESSMENT:  CLINICAL IMPRESSION: Pt presenting to session with increased soreness and stiffness. OT initiated stretches after warm up this session to improve her ROM and decrease pain. Additionally, pt worked on functional reaching this session with weights, which she was able to reach overhead both in flexion and abduction with min to mod fatigue. Verbal and tactile cuing provided for positioning and technique.    PERFORMANCE DEFICITS: in functional skills including in functional skills including ADLs, IADLs, coordination, tone, ROM, strength, pain, fascial restrictions, muscle spasms, and UE functional use.   PLAN:  OT FREQUENCY: 2x/week  OT DURATION: 8 weeks  PLANNED INTERVENTIONS: self care/ADL training, therapeutic exercise, therapeutic activity, neuromuscular  re-education, manual therapy, passive range of motion, splinting, electrical stimulation, ultrasound, moist heat, cryotherapy, patient/family education, and DME and/or AE instructions  CONSULTED AND AGREED WITH PLAN OF CARE: Patient  PLAN FOR NEXT SESSION: Manual Therapy, AA/ROM, A/ROM, wall slides    Trish Mage, OTR/L [phone] 03/18/2023, 10:35 PM

## 2023-03-21 ENCOUNTER — Encounter (HOSPITAL_COMMUNITY): Payer: Medicare Other | Admitting: Occupational Therapy

## 2023-03-25 ENCOUNTER — Encounter (HOSPITAL_COMMUNITY): Payer: Self-pay | Admitting: Occupational Therapy

## 2023-03-25 ENCOUNTER — Ambulatory Visit (HOSPITAL_COMMUNITY): Payer: Medicare Other | Admitting: Occupational Therapy

## 2023-03-25 DIAGNOSIS — R29898 Other symptoms and signs involving the musculoskeletal system: Secondary | ICD-10-CM | POA: Diagnosis not present

## 2023-03-25 DIAGNOSIS — M25611 Stiffness of right shoulder, not elsewhere classified: Secondary | ICD-10-CM

## 2023-03-25 DIAGNOSIS — M25511 Pain in right shoulder: Secondary | ICD-10-CM | POA: Diagnosis not present

## 2023-03-25 NOTE — Patient Instructions (Signed)
1) Strengthening: Chest Pull - Resisted   Hold Theraband in front of body with hands about shoulder width a part. Pull band a part and back together slowly. Repeat __10-15__ times. Complete ___1_ set(s) per session.. Repeat ___1_ session(s) per day.  http://orth.exer.us/926   Copyright  VHI. All rights reserved.   2) PNF Strengthening: Resisted   Standing with resistive band around each hand, bring right arm up and away, thumb back. Repeat _10-15___ times per set. Do __1__ sets per session. Do __1__ sessions per day.    3) Resisted External Rotation: in Neutral - Bilateral   Sit or stand, tubing in both hands, elbows at sides, bent to 90, forearms forward. Pinch shoulder blades together and rotate forearms out. Keep elbows at sides. Repeat _10-15___ times per set. Do __1__ sets per session. Do _1___ sessions per day.  http://orth.exer.us/966   Copyright  VHI. All rights reserved.   4) PNF Strengthening: Resisted   Standing, hold resistive band above head. Bring right arm down and out from side. Repeat _10-15___ times per set. Do __1__ sets per session. Do __1__ sessions per day.  http://orth.exer.us/922   Copyright  VHI. All rights reserved.     Theraband strengthening: Complete 10-15X, 1-2X/day  1) Shoulder protraction  Anchor band in doorway, stand with back to door. Push your hand forward as much as you can to bringing your shoulder blades forward on your rib cage.      2) Shoulder horizontal abduction  Standing with a theraband anchored at chest height, begin with arm straight and some tension in the band. Move your arm out to your side (keeping straight the whole time). Bring the affected arm back to midline.     3) Shoulder Internal Rotation  While holding an elastic band at your side with your elbow bent, start with your hand away from your stomach, then pull the band towards your stomach. Keep your elbow near your side the entire time.     4)  Shoulder External Rotation  While holding an elastic band at your side with your elbow bent, start with your hand near your stomach and then pull the band away. Keep your elbow at your side the entire time.     5) Shoulder flexion  While standing with back to the door, holding Theraband at hand level, raise arm in front of you.  Keep elbow straight through entire movement.      6) Shoulder abduction  While holding an elastic band at your side, draw up your arm to the side keeping your elbow straight.

## 2023-03-25 NOTE — Therapy (Unsigned)
OUTPATIENT OCCUPATIONAL THERAPY ORTHO TREATMENT NOTE   Patient Name: Nicole Newman MRN: [id number] DOB:1951/04/04, 72 y.o., female Today's Date: 03/26/2023   END OF SESSION:  OT End of Session - 03/25/23 1346     Visit Number 16    Number of Visits 17    Date for OT Re-Evaluation 04/11/23    Authorization Type BCBS Medicare    Progress Note Due on Visit 10    OT Start Time 1306    OT Stop Time 1346    OT Time Calculation (min) 40 min    Activity Tolerance Patient tolerated treatment well    Behavior During Therapy WFL for tasks assessed/performed              Past Medical History:  Diagnosis Date   Anemia    Arthritis    Depression    Dysrhythmia    PVC'S 2017   Family history of adverse reaction to anesthesia    states mother had small MI during anesthesia   GERD (gastroesophageal reflux disease)    High cholesterol    Hypertension    states under control with med., has been on med. x 2.5 yr.   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   BREAST REDUCTION SURGERY     CHOLECYSTECTOMY N/A 05/06/2022   Procedure: LAPAROSCOPIC CHOLECYSTECTOMY;  Surgeon: Fritzi Mandes, MD;  Location: [facility];  Service: General;  Laterality: N/A;   CHONDROPLASTY Left 02/27/2016   Procedure: CHONDROPLASTY;  Surgeon: Salvatore Marvel, MD;  Location: [facility];  Service: Orthopedics;  Laterality: Left;   DIAGNOSTIC LAPAROSCOPY     ESOPHAGEAL DILATION     ESOPHAGOGASTRODUODENOSCOPY (EGD) WITH PROPOFOL N/A 04/01/2019   Procedure: ESOPHAGOGASTRODUODENOSCOPY (EGD) WITH PROPOFOL;  Surgeon: Carman Ching, MD;  Location: [facility];  Service:  Endoscopy;  Laterality: N/A;   EXCISION MORTON'S NEUROMA Right    foot   KNEE ARTHROSCOPY WITH LATERAL MENISECTOMY Left 02/27/2016   Procedure: KNEE ARTHROSCOPY WITH LATERAL and MEDIAL MENISCECTOMY, left;  Surgeon: Salvatore Marvel, MD;  Location: [facility];  Service: Orthopedics;  Laterality: Left;   LAPAROSCOPIC BILATERAL SALPINGO OOPHERECTOMY Bilateral 07/06/2018   Procedure: LAPAROSCOPIC BILATERAL SALPINGO OOPHORECTOMY;  Surgeon: Ward, Elenora Fender, MD;  Location: [facility];  Service: Gynecology;  Laterality: Bilateral;   LAPAROSCOPIC HYSTERECTOMY N/A 07/06/2018   Procedure: HYSTERECTOMY TOTAL LAPAROSCOPIC;  Surgeon: Ward, Elenora Fender, MD;  Location: [facility];  Service: Gynecology;  Laterality: N/A;   REDUCTION MAMMAPLASTY Bilateral    SAVORY DILATION N/A 04/01/2019   Procedure: SAVORY DILATION;  Surgeon: Carman Ching, MD;  Location: [facility];  Service: Endoscopy;  Laterality: N/A;   SKIN CANCER EXCISION     Patient Active Problem List   Diagnosis Date Noted   Hypertension    GERD (gastroesophageal reflux disease)    Family history of adverse reaction to anesthesia    Medial meniscus tear 01/30/2016   Lateral meniscal tear left knee 01/30/2016   Palpitations 07/31/2015   Ventricular premature beats 07/31/2015   Tachycardia, unspecified 07/31/2015    PCP: Margaretann Loveless, MD [facility] Internal  Medicine REFERRING PROVIDER: Ramond Marrow, MD  ONSET DATE: 01/15/23  REFERRING DIAG: R Reverse Total Shoulder  THERAPY DIAG:  Acute pain of right shoulder  Stiffness of right shoulder, not elsewhere classified  Other symptoms and signs involving the musculoskeletal system  Rationale for Evaluation and Treatment: Rehabilitation  SUBJECTIVE:   SUBJECTIVE STATEMENT: S: "I'm a little sore right now"  PERTINENT HISTORY: Pt had a fall 2 years ago resulting in torn ligaments and increased pain/lack of mobility. Pt had right reverse TSA on 01/15/23.   PRECAUTIONS: Shoulder-see  protocol  WEIGHT BEARING RESTRICTIONS: Yes >1lb  PAIN:  Are you having pain? No  FALLS: Has patient fallen in last 6 months? Yes. Number of falls 1  PLOF: Independent  PATIENT GOALS: "I want my arm to be back at 100%."  NEXT MD VISIT: 02/14/23  OBJECTIVE:   HAND DOMINANCE: Right  ADLs: Overall ADLs: Pt is unable to dress or bath independently, requiring mod to max assist due to weakness, pain, and limitations of the RUE. Additionally at this time she is requiring total assist with all cooking, cleaning, and yard work.   UPPER EXTREMITY ROM:       Assessed in supine, er/IR adducted  Passive ROM Right eval Right 02/06/23  Shoulder flexion 120 129  Shoulder abduction 87 95  Shoulder internal rotation 90 90  Shoulder external rotation 17 20  (Blank rows = not tested)     Active ROM Right 02/20/23 Right 03/17/23  Shoulder flexion 110 122  Shoulder abduction 98 124  Shoulder internal rotation 90 90  Shoulder external rotation 44 47  (Blank rows = not tested)  UPPER EXTREMITY MMT:     Assessed in seated, er/IR adducted  MMT Right eval Right 03/17/23  Shoulder flexion  4+/5  Shoulder abduction  4+/5  Shoulder internal rotation  4+/5  Shoulder external rotation  4+/5  (Blank rows = not tested)  SENSATION: Pt reports that her thumb is numb  EDEMA: Swelling noted in the elbow  OBSERVATIONS: mod to severe fascial restrictions along the biceps, deltoid, scapularis, and pectoralis   TODAY'S TREATMENT:                                                                                                                              DATE:   03/25/23 -A/ROM: standing, flexion, abduction, protraction, horizontal abduction, er/IR, x15 -Stretches: flexion, Bicep on the wall, er on the door, doorway stretch, ir behind the back, er behind the back, 3x15" -PNF Strengthening: green band,  chest pulls, overhead pulls, er pulls, PNF up, PNF down, x15 -Shoulder Strengthening: 4lb  dumbbells, flexion, abduction, protraction, horizontal abduction, er/IR, x12 -Theraband Exercises: green band, flexion, abduction, horizontal abduction, er, IR, x15  03/17/23 -A/ROM: standing, flexion, abduction, protraction, horizontal abduction, er/IR, x15 -Stretches: flexion, Bicep on the wall, er on the door, doorway stretch, 3x15" -Shoulder strengthening: red band, horizontal abduction, er, IR, flexion, abduction, x12 -functional reaching: 6- 1lb weights,  4- 2lb weights, counter to shoulder height to head height to overhead in flexion, down in same order in abduction -Overhead Lacing  03/07/23 -Manual Therapy: myofascial release and trigger point applied to biceps, deltoid, scapularis, trapezius, in order to reduce pain and facial restrictions, as well as improving ROM.  -A/ROM: standing, flexion, abduction, protraction, horizontal abduction, er/IR, x12 -Proximal shoulder exercises: paddles, criss cross, circles both directions, 2x12 each -ABC's on the wall -Loop Band exercises: green band, wall taps, V ups, Wall Clocks, x12 -Scapular Strengthening: green band, extension, retraction, protraction, rows, x12    PATIENT EDUCATION: Education details: Public relations account executive Person educated: Patient Education method: Explanation and Demonstration Education comprehension: verbalized understanding and returned demonstration  HOME EXERCISE PROGRAM: 7/19: Elbow, wrist, and hand ROM 7/22: Table Slides 8/6: AA/ROM 8/19: A/ROM 9/4: Scapular Strengthening 9/6: Loop band exercises 9/16: Stretches 9/25: Shoulder Strengthening  GOALS: Goals reviewed with patient? Yes   SHORT TERM GOALS: Target date: 02/08/23  Pt will be provided with and educated on HEP to improve mobility in RUE required for use during ADL completion.   Goal status: IN PROGRESS  2.  Pt will increase RUE P/ROM by 40 degrees to improve ability to use RUE during dressing tasks with minimal compensatory techniques.    Goal status: IN PROGRESS  3.  Pt will increase RUE strength to 3+/5 to improve ability to reach for items at waist to chest height during bathing and grooming tasks.   Goal status: IN PROGRESS   LONG TERM GOALS: Target date: 03/17/23  Pt will decrease pain in RUE to 3/10 or less to improve ability to sleep for 2+ consecutive hours without waking due to pain.   Goal status: IN PROGRESS  2.  Pt will decrease RUE fascial restrictions to min amounts or less to improve mobility required for functional reaching tasks.   Goal status: IN PROGRESS  3.  Pt will increase RUE A/ROM by 50 degrees to improve ability to use RUE when reaching overhead or behind back during dressing and bathing tasks.   Goal status: IN PROGRESS  4.  Pt will increase RUE strength to 4+/5 or greater to improve ability to use RUE when lifting or carrying items during meal preparation/housework/yardwork tasks.   Goal status: IN PROGRESS  5.  Pt will return to highest level of function using RUE as dominant during functional task completion.   Goal status: IN PROGRESS   ASSESSMENT:  CLINICAL IMPRESSION: This session pt continuing to work on strengthening and ROM. OT added external and internal rotation stretches behind the back for improved BADL's. Overall she is improving well with her strength and motion, as well as endurance. OT providing verbal and tactile cuing for positioning and technique.   PERFORMANCE DEFICITS: in functional skills including in functional skills including ADLs, IADLs, coordination, tone, ROM, strength, pain, fascial restrictions, muscle spasms, and UE functional use.   PLAN:  OT FREQUENCY: 2x/week  OT DURATION: 8 weeks  PLANNED INTERVENTIONS: self care/ADL training, therapeutic exercise, therapeutic activity, neuromuscular re-education, manual therapy, passive range of motion, splinting, electrical stimulation, ultrasound, moist heat, cryotherapy, patient/family education, and DME  and/or AE instructions  CONSULTED AND AGREED WITH PLAN OF CARE: Patient  PLAN FOR NEXT SESSION: Manual Therapy, AA/ROM, A/ROM, wall slides    Trish Mage, OTR/L [phone] 03/26/2023, 8:58 PM

## 2023-03-27 ENCOUNTER — Encounter (HOSPITAL_COMMUNITY): Payer: Medicare Other | Admitting: Occupational Therapy

## 2023-03-28 DIAGNOSIS — M19011 Primary osteoarthritis, right shoulder: Secondary | ICD-10-CM | POA: Diagnosis not present

## 2023-04-10 ENCOUNTER — Ambulatory Visit (HOSPITAL_COMMUNITY): Payer: Medicare Other | Attending: Physician Assistant | Admitting: Occupational Therapy

## 2023-04-10 ENCOUNTER — Encounter (HOSPITAL_COMMUNITY): Payer: Self-pay | Admitting: Occupational Therapy

## 2023-04-10 DIAGNOSIS — M25611 Stiffness of right shoulder, not elsewhere classified: Secondary | ICD-10-CM | POA: Diagnosis not present

## 2023-04-10 DIAGNOSIS — R29898 Other symptoms and signs involving the musculoskeletal system: Secondary | ICD-10-CM | POA: Diagnosis not present

## 2023-04-10 DIAGNOSIS — M25511 Pain in right shoulder: Secondary | ICD-10-CM | POA: Insufficient documentation

## 2023-04-10 NOTE — Therapy (Signed)
OUTPATIENT OCCUPATIONAL THERAPY ORTHO TREATMENT NOTE AND DISCHARGE NOTE  Patient Name: Nicole Newman MRN: [id number] DOB:1950-08-01, 72 y.o., female Today's Date: 04/10/2023  OCCUPATIONAL THERAPY DISCHARGE SUMMARY  Visits from Start of Care: 17  Current functional level related to goals / functional outcomes: Pt has met all OT goals. Her ROM, strength, and mobility are all WFL. Pain is minimal, as are her fascial restrictions.    Remaining deficits: No remaining deficits   Education / Equipment: Pt was provided a comprehensive HEP   Plan: Patient agrees to discharge as all OT goals have been met.       END OF SESSION:  OT End of Session - 04/10/23 1040     Visit Number 17    Number of Visits 17    Date for OT Re-Evaluation 04/11/23    Authorization Type BCBS Medicare    Progress Note Due on Visit 10    OT Start Time 0935    OT Stop Time 1000    OT Time Calculation (min) 25 min    Activity Tolerance Patient tolerated treatment well    Behavior During Therapy WFL for tasks assessed/performed               Past Medical History:  Diagnosis Date   Anemia    Arthritis    Depression    Dysrhythmia    PVC'S 2017   Family history of adverse reaction to anesthesia    states mother had small MI during anesthesia   GERD (gastroesophageal reflux disease)    High cholesterol    Hypertension    states under control with med., has been on med. x 2.5 yr.   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   BREAST REDUCTION SURGERY     CHOLECYSTECTOMY N/A 05/06/2022   Procedure: LAPAROSCOPIC CHOLECYSTECTOMY;  Surgeon: Fritzi Mandes, MD;   Location: [facility];  Service: General;  Laterality: N/A;   CHONDROPLASTY Left 02/27/2016   Procedure: CHONDROPLASTY;  Surgeon: Salvatore Marvel, MD;  Location: [facility];  Service: Orthopedics;  Laterality: Left;   DIAGNOSTIC LAPAROSCOPY     ESOPHAGEAL DILATION     ESOPHAGOGASTRODUODENOSCOPY (EGD) WITH PROPOFOL N/A 04/01/2019   Procedure: ESOPHAGOGASTRODUODENOSCOPY (EGD) WITH PROPOFOL;  Surgeon: Carman Ching, MD;  Location: [facility];  Service: Endoscopy;  Laterality: N/A;   EXCISION MORTON'S NEUROMA Right    foot   KNEE ARTHROSCOPY WITH LATERAL MENISECTOMY Left 02/27/2016   Procedure: KNEE ARTHROSCOPY WITH LATERAL and MEDIAL MENISCECTOMY, left;  Surgeon: Salvatore Marvel, MD;  Location:  [facility];  Service: Orthopedics;  Laterality: Left;   LAPAROSCOPIC BILATERAL SALPINGO OOPHERECTOMY Bilateral 07/06/2018   Procedure: LAPAROSCOPIC BILATERAL SALPINGO OOPHORECTOMY;  Surgeon: Ward, Elenora Fender, MD;  Location: [facility];  Service: Gynecology;  Laterality: Bilateral;   LAPAROSCOPIC HYSTERECTOMY N/A 07/06/2018   Procedure: HYSTERECTOMY TOTAL LAPAROSCOPIC;  Surgeon: Ward, Elenora Fender, MD;  Location: [facility];  Service: Gynecology;  Laterality: N/A;   REDUCTION MAMMAPLASTY Bilateral    SAVORY DILATION N/A 04/01/2019   Procedure: SAVORY DILATION;  Surgeon: Carman Ching, MD;  Location: [facility];  Service: Endoscopy;  Laterality: N/A;   SKIN CANCER EXCISION     Patient Active Problem List   Diagnosis Date Noted   Hypertension    GERD (gastroesophageal reflux disease)    Family history of adverse reaction to anesthesia    Medial meniscus tear 01/30/2016   Lateral meniscal tear left knee 01/30/2016   Palpitations 07/31/2015   Ventricular premature beats 07/31/2015   Tachycardia, unspecified 07/31/2015    PCP: Margaretann Loveless, MD [facility] Internal Medicine REFERRING PROVIDER: Ramond Marrow, MD  ONSET DATE: 01/15/23  REFERRING DIAG: R Reverse Total Shoulder  THERAPY DIAG:   Acute pain of right shoulder  Stiffness of right shoulder, not elsewhere classified  Other symptoms and signs involving the musculoskeletal system  Rationale for Evaluation and Treatment: Rehabilitation  SUBJECTIVE:   SUBJECTIVE STATEMENT: S: "[place] [employer] and it jerked me around and I've been sore ever since."  PERTINENT HISTORY: Pt had a fall 2 years ago resulting in torn ligaments and increased pain/lack of mobility. Pt had right reverse TSA on 01/15/23.   PRECAUTIONS: Shoulder-see protocol  WEIGHT BEARING RESTRICTIONS: Yes >1lb  PAIN:  Are you having pain? No  FALLS: Has patient fallen in last 6 months? Yes. Number of falls 1  PLOF: Independent  PATIENT GOALS: "I want my arm to be back at 100%."  NEXT MD VISIT: 02/14/23  OBJECTIVE:   HAND DOMINANCE: Right  ADLs: Overall ADLs: Pt is unable to dress or bath independently, requiring mod to max assist due to weakness, pain, and limitations of the RUE. Additionally at this time she is requiring total assist with all cooking, cleaning, and yard work.   UPPER EXTREMITY ROM:       Assessed in supine, er/IR adducted  Passive ROM Right eval Right 02/06/23  Shoulder flexion 120 129  Shoulder abduction 87 95  Shoulder internal rotation 90 90  Shoulder external rotation 17 20  (Blank rows = not tested)     Active ROM Right 02/20/23 Right 03/17/23 Right 04/10/23  Shoulder flexion 110 122 136  Shoulder abduction 98 124 152  Shoulder internal rotation 90 90 90  Shoulder external rotation 44 47 58  (Blank rows = not tested)  UPPER EXTREMITY MMT:     Assessed in seated, er/IR adducted  MMT Right eval Right 03/17/23 Right 04/10/23  Shoulder flexion  4+/5 5/5  Shoulder abduction  4+/5 5/5  Shoulder internal rotation  4+/5 5/5  Shoulder external rotation  4+/5 5/5  (Blank rows = not tested)  SENSATION: Pt reports that her thumb is numb  EDEMA: Swelling noted in the elbow  OBSERVATIONS: mod to  severe fascial restrictions along the biceps, deltoid, scapularis, and pectoralis   TODAY'S TREATMENT:                                                                                                                              DATE:   04/10/23 -Manual  Therapy: myofascial release and trigger point applied to biceps, deltoid, scapularis, trapezius, in order to reduce pain and facial restrictions, as well as improving ROM. -A/ROM: standing, flexion, abduction, protraction, horizontal abduction, er/IR, x15 -Stretches: flexion, Bicep on the wall, er on the door, doorway stretch, ir behind the back, er behind the back, 3x15" -Reviewed Theraband Exercises for discharge  03/25/23 -A/ROM: standing, flexion, abduction, protraction, horizontal abduction, er/IR, x15 -Stretches: flexion, Bicep on the wall, er on the door, doorway stretch, ir behind the back, er behind the back, 3x15" -PNF Strengthening: green band,  chest pulls, overhead pulls, er pulls, PNF up, PNF down, x15 -Shoulder Strengthening: 4lb dumbbells, flexion, abduction, protraction, horizontal abduction, er/IR, x12 -Theraband Exercises: green band, flexion, abduction, horizontal abduction, er, IR, x15  03/17/23 -A/ROM: standing, flexion, abduction, protraction, horizontal abduction, er/IR, x15 -Stretches: flexion, Bicep on the wall, er on the door, doorway stretch, 3x15" -Shoulder strengthening: red band, horizontal abduction, er, IR, flexion, abduction, x12 -functional reaching: 6- 1lb weights, 4- 2lb weights, counter to shoulder height to head height to overhead in flexion, down in same order in abduction -Overhead Lacing   PATIENT EDUCATION: Education details: Reviewed HEP Person educated: Patient Education method: Medical illustrator Education comprehension: verbalized understanding and returned demonstration  HOME EXERCISE PROGRAM: 7/19: Elbow, wrist, and hand ROM 7/22: Table Slides 8/6: AA/ROM 8/19: A/ROM 9/4:  Scapular Strengthening 9/6: Loop band exercises 9/16: Stretches 9/25: Shoulder Strengthening  GOALS: Goals reviewed with patient? Yes   SHORT TERM GOALS: Target date: 02/08/23  Pt will be provided with and educated on HEP to improve mobility in RUE required for use during ADL completion.   Goal status: MET  2.  Pt will increase RUE P/ROM by 40 degrees to improve ability to use RUE during dressing tasks with minimal compensatory techniques.   Goal status: MET  3.  Pt will increase RUE strength to 3+/5 to improve ability to reach for items at waist to chest height during bathing and grooming tasks.   Goal status: MET   LONG TERM GOALS: Target date: 03/17/23  Pt will decrease pain in RUE to 3/10 or less to improve ability to sleep for 2+ consecutive hours without waking due to pain.   Goal status: MET  2.  Pt will decrease RUE fascial restrictions to min amounts or less to improve mobility required for functional reaching tasks.   Goal status: MET  3.  Pt will increase RUE A/ROM by 50 degrees to improve ability to use RUE when reaching overhead or behind back during dressing and bathing tasks.   Goal status: NOT MET  4.  Pt will increase RUE strength to 4+/5 or greater to improve ability to use RUE when lifting or carrying items during meal preparation/housework/yardwork tasks.   Goal status: MET  5.  Pt will return to highest level of function using RUE as dominant during functional task completion.   Goal status: MET   ASSESSMENT:  CLINICAL IMPRESSION: This session pt completed reassessment for discharge. She is demonstrating  good ROM and 5/5 strength. Pt did have increased soreness this session following a theme park ride last week that jerked her around. Overall her movement pattern remains intact with no visible deficits. Pt has no further skilled OT needs and will be discharged from OT.   PERFORMANCE DEFICITS: in functional skills including in functional skills  including ADLs, IADLs, coordination, tone, ROM, strength, pain, fascial restrictions, muscle spasms, and UE functional use.   PLAN:  OT FREQUENCY: 2x/week  OT DURATION: 8 weeks  PLANNED INTERVENTIONS: self care/ADL training, therapeutic exercise, therapeutic activity, neuromuscular re-education, manual therapy, passive range of motion, splinting, electrical stimulation, ultrasound, moist heat, cryotherapy, patient/family education, and DME and/or AE instructions  CONSULTED AND AGREED WITH PLAN OF CARE: Patient  PLAN FOR NEXT SESSION: Discharge    Trish Mage, OTR/L [phone] 04/10/2023, 10:41 AM

## 2023-04-17 ENCOUNTER — Encounter (HOSPITAL_COMMUNITY): Payer: Self-pay | Admitting: Occupational Therapy

## 2023-04-21 DIAGNOSIS — G4733 Obstructive sleep apnea (adult) (pediatric): Secondary | ICD-10-CM | POA: Diagnosis not present

## 2023-04-22 DIAGNOSIS — H524 Presbyopia: Secondary | ICD-10-CM | POA: Diagnosis not present

## 2023-05-01 DIAGNOSIS — G4733 Obstructive sleep apnea (adult) (pediatric): Secondary | ICD-10-CM | POA: Diagnosis not present

## 2023-05-05 DIAGNOSIS — H524 Presbyopia: Secondary | ICD-10-CM | POA: Diagnosis not present

## 2023-05-23 DIAGNOSIS — G4733 Obstructive sleep apnea (adult) (pediatric): Secondary | ICD-10-CM | POA: Diagnosis not present

## 2023-06-18 DIAGNOSIS — Z79899 Other long term (current) drug therapy: Secondary | ICD-10-CM | POA: Diagnosis not present

## 2023-06-18 DIAGNOSIS — Z9989 Dependence on other enabling machines and devices: Secondary | ICD-10-CM | POA: Diagnosis not present

## 2023-06-18 DIAGNOSIS — E7800 Pure hypercholesterolemia, unspecified: Secondary | ICD-10-CM | POA: Diagnosis not present

## 2023-06-18 DIAGNOSIS — Z0000 Encounter for general adult medical examination without abnormal findings: Secondary | ICD-10-CM | POA: Diagnosis not present

## 2023-06-18 DIAGNOSIS — G4733 Obstructive sleep apnea (adult) (pediatric): Secondary | ICD-10-CM | POA: Diagnosis not present

## 2023-06-18 DIAGNOSIS — K219 Gastro-esophageal reflux disease without esophagitis: Secondary | ICD-10-CM | POA: Diagnosis not present

## 2023-06-18 DIAGNOSIS — Z1331 Encounter for screening for depression: Secondary | ICD-10-CM | POA: Diagnosis not present

## 2023-06-22 DIAGNOSIS — G4733 Obstructive sleep apnea (adult) (pediatric): Secondary | ICD-10-CM | POA: Diagnosis not present

## 2023-07-11 DIAGNOSIS — I6523 Occlusion and stenosis of bilateral carotid arteries: Secondary | ICD-10-CM | POA: Diagnosis not present

## 2023-07-11 DIAGNOSIS — I779 Disorder of arteries and arterioles, unspecified: Secondary | ICD-10-CM | POA: Diagnosis not present

## 2023-07-23 DIAGNOSIS — G4733 Obstructive sleep apnea (adult) (pediatric): Secondary | ICD-10-CM | POA: Diagnosis not present

## 2023-07-24 DIAGNOSIS — G4733 Obstructive sleep apnea (adult) (pediatric): Secondary | ICD-10-CM | POA: Diagnosis not present

## 2023-08-23 DIAGNOSIS — G4733 Obstructive sleep apnea (adult) (pediatric): Secondary | ICD-10-CM | POA: Diagnosis not present

## 2023-09-08 DIAGNOSIS — L649 Androgenic alopecia, unspecified: Secondary | ICD-10-CM | POA: Diagnosis not present

## 2023-09-08 DIAGNOSIS — Z85828 Personal history of other malignant neoplasm of skin: Secondary | ICD-10-CM | POA: Diagnosis not present

## 2023-09-08 DIAGNOSIS — D692 Other nonthrombocytopenic purpura: Secondary | ICD-10-CM | POA: Diagnosis not present

## 2023-09-10 DIAGNOSIS — G4733 Obstructive sleep apnea (adult) (pediatric): Secondary | ICD-10-CM | POA: Diagnosis not present

## 2023-09-22 DIAGNOSIS — Z85828 Personal history of other malignant neoplasm of skin: Secondary | ICD-10-CM | POA: Diagnosis not present

## 2023-09-22 DIAGNOSIS — D692 Other nonthrombocytopenic purpura: Secondary | ICD-10-CM | POA: Diagnosis not present

## 2023-09-22 DIAGNOSIS — D1721 Benign lipomatous neoplasm of skin and subcutaneous tissue of right arm: Secondary | ICD-10-CM | POA: Diagnosis not present

## 2023-09-22 DIAGNOSIS — L814 Other melanin hyperpigmentation: Secondary | ICD-10-CM | POA: Diagnosis not present

## 2023-10-07 DIAGNOSIS — R1319 Other dysphagia: Secondary | ICD-10-CM | POA: Diagnosis not present

## 2023-10-20 ENCOUNTER — Other Ambulatory Visit: Payer: Self-pay | Admitting: Internal Medicine

## 2023-10-20 DIAGNOSIS — Z1231 Encounter for screening mammogram for malignant neoplasm of breast: Secondary | ICD-10-CM

## 2023-11-04 DIAGNOSIS — H18513 Endothelial corneal dystrophy, bilateral: Secondary | ICD-10-CM | POA: Diagnosis not present

## 2023-11-06 DIAGNOSIS — Z860101 Personal history of adenomatous and serrated colon polyps: Secondary | ICD-10-CM | POA: Diagnosis not present

## 2023-11-06 DIAGNOSIS — Z09 Encounter for follow-up examination after completed treatment for conditions other than malignant neoplasm: Secondary | ICD-10-CM | POA: Diagnosis not present

## 2023-11-06 DIAGNOSIS — D128 Benign neoplasm of rectum: Secondary | ICD-10-CM | POA: Diagnosis not present

## 2023-11-06 DIAGNOSIS — K5730 Diverticulosis of large intestine without perforation or abscess without bleeding: Secondary | ICD-10-CM | POA: Diagnosis not present

## 2023-11-07 ENCOUNTER — Ambulatory Visit
Admission: RE | Admit: 2023-11-07 | Discharge: 2023-11-07 | Disposition: A | Discharge status: Home / Self Care | Source: Ambulatory Visit | Attending: Internal Medicine | Admitting: Internal Medicine

## 2023-11-07 DIAGNOSIS — Z1231 Encounter for screening mammogram for malignant neoplasm of breast: Secondary | ICD-10-CM

## 2023-11-10 DIAGNOSIS — G4733 Obstructive sleep apnea (adult) (pediatric): Secondary | ICD-10-CM | POA: Diagnosis not present

## 2023-11-10 DIAGNOSIS — D128 Benign neoplasm of rectum: Secondary | ICD-10-CM | POA: Diagnosis not present

## 2023-12-03 DIAGNOSIS — Z85828 Personal history of other malignant neoplasm of skin: Secondary | ICD-10-CM | POA: Diagnosis not present

## 2023-12-03 DIAGNOSIS — K130 Diseases of lips: Secondary | ICD-10-CM | POA: Diagnosis not present

## 2023-12-03 DIAGNOSIS — I788 Other diseases of capillaries: Secondary | ICD-10-CM | POA: Diagnosis not present

## 2023-12-03 DIAGNOSIS — L814 Other melanin hyperpigmentation: Secondary | ICD-10-CM | POA: Diagnosis not present

## 2023-12-30 DIAGNOSIS — G4733 Obstructive sleep apnea (adult) (pediatric): Secondary | ICD-10-CM | POA: Diagnosis not present

## 2024-01-14 ENCOUNTER — Ambulatory Visit

## 2024-01-14 DIAGNOSIS — M25531 Pain in right wrist: Secondary | ICD-10-CM | POA: Diagnosis not present

## 2024-01-21 DIAGNOSIS — L570 Actinic keratosis: Secondary | ICD-10-CM | POA: Diagnosis not present

## 2024-01-30 DIAGNOSIS — G4733 Obstructive sleep apnea (adult) (pediatric): Secondary | ICD-10-CM | POA: Diagnosis not present

## 2024-02-23 DIAGNOSIS — R059 Cough, unspecified: Secondary | ICD-10-CM | POA: Diagnosis not present

## 2024-02-23 DIAGNOSIS — Z96611 Presence of right artificial shoulder joint: Secondary | ICD-10-CM | POA: Diagnosis not present

## 2024-02-23 DIAGNOSIS — J40 Bronchitis, not specified as acute or chronic: Secondary | ICD-10-CM | POA: Diagnosis not present

## 2024-03-01 DIAGNOSIS — G4733 Obstructive sleep apnea (adult) (pediatric): Secondary | ICD-10-CM | POA: Diagnosis not present

## 2024-03-16 DIAGNOSIS — H18513 Endothelial corneal dystrophy, bilateral: Secondary | ICD-10-CM | POA: Diagnosis not present

## 2024-03-16 DIAGNOSIS — H2513 Age-related nuclear cataract, bilateral: Secondary | ICD-10-CM | POA: Diagnosis not present

## 2024-03-27 LAB — AMB RESULTS CONSOLE CBG: Glucose: 110

## 2024-03-27 NOTE — Progress Notes (Addendum)
 Patient attended event on 03/27/2024 where BP was 115/78 and blood glucose was 110. Patient noted she has a PCP, Medicare for insurance, and is not a smoker. Patient did not fill out SDOH screening questions.

## 2024-04-21 DIAGNOSIS — Z85828 Personal history of other malignant neoplasm of skin: Secondary | ICD-10-CM | POA: Diagnosis not present

## 2024-04-21 DIAGNOSIS — Z7982 Long term (current) use of aspirin: Secondary | ICD-10-CM | POA: Diagnosis not present

## 2024-04-21 DIAGNOSIS — I10 Essential (primary) hypertension: Secondary | ICD-10-CM | POA: Diagnosis not present

## 2024-04-21 DIAGNOSIS — E785 Hyperlipidemia, unspecified: Secondary | ICD-10-CM | POA: Diagnosis not present

## 2024-04-21 DIAGNOSIS — H25811 Combined forms of age-related cataract, right eye: Secondary | ICD-10-CM | POA: Diagnosis not present

## 2024-04-21 DIAGNOSIS — Z79899 Other long term (current) drug therapy: Secondary | ICD-10-CM | POA: Diagnosis not present

## 2024-04-21 DIAGNOSIS — H18511 Endothelial corneal dystrophy, right eye: Secondary | ICD-10-CM | POA: Diagnosis not present

## 2024-04-21 DIAGNOSIS — K219 Gastro-esophageal reflux disease without esophagitis: Secondary | ICD-10-CM | POA: Diagnosis not present

## 2024-04-21 DIAGNOSIS — G4730 Sleep apnea, unspecified: Secondary | ICD-10-CM | POA: Diagnosis not present

## 2024-05-06 NOTE — Progress Notes (Signed)
 Pt attended 03/27/2024 screening event with BP of 115/78 and blood sugar was 110. Pt noted at event that she does have a PCP. At event pt did not indicate any SDOH needs. Pt also noted that she is not a smoker and listed Medicare as her insurance at the event.   Per chart review pt does have a PCP ([facility]), insurance, and is not a smoker. Pt's last appt with PCP was 04/15/2024 and has an upcoming appt on 06/21/2024 (appts are not CHL-visible). Pt does not indicate any SDOH needs at this time.  No additional pt f/u to be scheduled at this time per health equity protocol.

## 2024-05-17 DIAGNOSIS — Z7982 Long term (current) use of aspirin: Secondary | ICD-10-CM | POA: Diagnosis not present

## 2024-05-17 DIAGNOSIS — E785 Hyperlipidemia, unspecified: Secondary | ICD-10-CM | POA: Diagnosis not present

## 2024-05-17 DIAGNOSIS — G4730 Sleep apnea, unspecified: Secondary | ICD-10-CM | POA: Diagnosis not present

## 2024-05-17 DIAGNOSIS — H1820 Unspecified corneal edema: Secondary | ICD-10-CM | POA: Diagnosis not present

## 2024-05-17 DIAGNOSIS — H18512 Endothelial corneal dystrophy, left eye: Secondary | ICD-10-CM | POA: Diagnosis not present

## 2024-05-17 DIAGNOSIS — Z79899 Other long term (current) drug therapy: Secondary | ICD-10-CM | POA: Diagnosis not present

## 2024-05-17 DIAGNOSIS — H25812 Combined forms of age-related cataract, left eye: Secondary | ICD-10-CM | POA: Diagnosis not present

## 2024-05-17 DIAGNOSIS — I10 Essential (primary) hypertension: Secondary | ICD-10-CM | POA: Diagnosis not present

## 2024-05-17 DIAGNOSIS — K219 Gastro-esophageal reflux disease without esophagitis: Secondary | ICD-10-CM | POA: Diagnosis not present

## 2024-05-20 DIAGNOSIS — Z947 Corneal transplant status: Secondary | ICD-10-CM | POA: Diagnosis not present

## 2024-06-22 ENCOUNTER — Other Ambulatory Visit (HOSPITAL_BASED_OUTPATIENT_CLINIC_OR_DEPARTMENT_OTHER): Payer: Self-pay | Admitting: Internal Medicine

## 2024-06-22 DIAGNOSIS — E2839 Other primary ovarian failure: Secondary | ICD-10-CM

## 2024-07-12 NOTE — Therapy (Signed)
 " OUTPATIENT PHYSICAL THERAPY THORACOLUMBAR EVALUATION   Patient Name: Nicole Newman MRN: [id number] DOB:1951/05/04, 74 y.o., female Today's Date: 07/13/2024  END OF SESSION:  PT End of Session - 07/13/24 1032     Visit Number 1    Number of Visits 4    Date for Recertification  08/10/24    Authorization Type BCBS Medicare    PT Start Time 1032    PT Stop Time 1112    PT Time Calculation (min) 40 min    Activity Tolerance Patient tolerated treatment well    Behavior During Therapy [facility] for tasks assessed/performed          Past Medical History:  Diagnosis Date   Anemia    Arthritis    Depression    Dysrhythmia    PVC'S 2017   Family history of adverse reaction to anesthesia    states mother had small MI during anesthesia   GERD (gastroesophageal reflux disease)    High cholesterol    Hypertension    states under control with med., has been on med. x 2.5 yr.   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   BREAST REDUCTION SURGERY     CHOLECYSTECTOMY N/A 05/06/2022   Procedure: LAPAROSCOPIC CHOLECYSTECTOMY;  Surgeon: Dasie Leonor CROME, MD;  Location: [facility];  Service: General;  Laterality: N/A;   CHONDROPLASTY Left 02/27/2016   Procedure: CHONDROPLASTY;  Surgeon: Lamar Millman, MD;  Location: [facility];  Service: Orthopedics;  Laterality: Left;   DIAGNOSTIC LAPAROSCOPY     ESOPHAGEAL DILATION     ESOPHAGOGASTRODUODENOSCOPY (EGD) WITH PROPOFOL  N/A 04/01/2019   Procedure: ESOPHAGOGASTRODUODENOSCOPY (EGD) WITH PROPOFOL ;  Surgeon: Celestia Agent, MD;  Location: [facility];  Service: Endoscopy;  Laterality: N/A;   EXCISION MORTON'S  NEUROMA Right    foot   KNEE ARTHROSCOPY WITH LATERAL MENISECTOMY Left 02/27/2016   Procedure: KNEE ARTHROSCOPY WITH LATERAL and MEDIAL MENISCECTOMY, left;  Surgeon: Lamar Millman, MD;  Location: [facility];  Service: Orthopedics;  Laterality: Left;   LAPAROSCOPIC BILATERAL SALPINGO OOPHERECTOMY Bilateral 07/06/2018   Procedure: LAPAROSCOPIC BILATERAL SALPINGO OOPHORECTOMY;  Surgeon: Ward, Mitzie BROCKS, MD;  Location: [facility];  Service: Gynecology;  Laterality: Bilateral;   LAPAROSCOPIC HYSTERECTOMY N/A 07/06/2018   Procedure: HYSTERECTOMY TOTAL LAPAROSCOPIC;  Surgeon: Ward, Mitzie BROCKS, MD;  Location: [facility];  Service: Gynecology;  Laterality: N/A;   REDUCTION MAMMAPLASTY Bilateral 2004   SAVORY DILATION N/A 04/01/2019   Procedure: SAVORY DILATION;  Surgeon: Celestia Agent, MD;  Location: [facility];  Service: Endoscopy;  Laterality: N/A;   SKIN CANCER EXCISION     Patient Active Problem List   Diagnosis Date Noted   Hypertension    GERD (gastroesophageal reflux disease)    Family history of adverse reaction to anesthesia    Medial meniscus tear 01/30/2016   Lateral meniscal tear left knee 01/30/2016   Palpitations 07/31/2015   Ventricular premature beats 07/31/2015   Tachycardia, unspecified 07/31/2015    PCP: Dwight Trula SQUIBB, MD  REFERRING PROVIDER: Dwight Trula SQUIBB, MD  REFERRING DIAG: M54.59 (ICD-10-CM) - Other low  back pain  Rationale for Evaluation and Treatment: Rehabilitation  THERAPY DIAG:  Low back pain, unspecified back pain laterality, unspecified chronicity, unspecified whether sciatica present  Muscle weakness (generalized)  Radiculopathy, lumbar region  ONSET DATE: chronic  SUBJECTIVE:                                                                                                                                                                                           SUBJECTIVE STATEMENT: Chronic and Intermittent back pain; some numbness in left  outer hip and thigh; seems to be generally more on left side; overall gets worse if she is doing a lot of housework or bending, squatting.  noticed some pain in legs if sits for prolonged period of time; trouble balancing with lifting left leg for dressing tasks. would like to get back into the [employer]  PERTINENT HISTORY:  History of bulging disc in the past Eye surgery this past fall and had to lie flat for several days  PAIN:  Are you having pain? Yes: NPRS scale: 1-2/10 Pain location: low back and both legs left > right Pain description: sore, and aching Aggravating factors: squatting or lots of cleaning Relieving factors: Advil   PRECAUTIONS: None  RED FLAGS: None   WEIGHT BEARING RESTRICTIONS: No  FALLS:  Has patient fallen in last 6 months? 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  DIAGNOSTIC FINDINGS:  None current  PATIENT SURVEYS:  Modified Oswestry:  MODIFIED OSWESTRY DISABILITY SCALE  Date: 07/13/2024 Score                                Total 15/50;30%   Interpretation of scores: Score Category Description  0-20% Minimal Disability The patient can cope with most living activities. Usually no treatment is indicated apart from advice on lifting, sitting and exercise  21-40% Moderate Disability The patient experiences more pain and difficulty with sitting, lifting and standing. Travel and social life are more difficult and they may be disabled from work. Personal care, sexual activity and sleeping are not grossly affected, and the patient can usually be managed by conservative means  41-60% Severe Disability Pain remains the main problem in this group, but activities of daily living are affected. These patients require a detailed investigation  61-80%  Crippled Back pain impinges on all aspects of the patients life. Positive intervention is required  81-100% Bed-bound These patients are either bed-bound or exaggerating their symptoms  Bluford FORBES Zoe DELENA Karon DELENA, et al. Surgery versus conservative management of stable thoracolumbar fracture: the PRESTO feasibility RCT. [place] ([place]): [employer]; 2021 Nov. [facility] Technology Assessment, No. 25.62.) Appendix 3, Oswestry Disability Index category descriptors. Available from: Findjewelers.cz  Minimally Clinically Important Difference (MCID) = 12.8%  COGNITION: Overall cognitive status: Within functional limits for tasks assessed     SENSATION: Some numbness left hip occassionally  MUSCLE LENGTH: Hamstrings: check next visit  POSTURE: rounded shoulders and forward head  PALPATION: General soreness left low back  LUMBAR ROM: * pain  AROM eval  Flexion Fingertips to top of ankles  Extension 50% available  Right lateral flexion   Left lateral flexion   Right rotation   Left rotation    (Blank rows = not tested)  LOWER EXTREMITY ROM:     Active  Right eval Left eval  Hip flexion    Hip extension    Hip abduction    Hip adduction    Hip internal rotation    Hip external rotation    Knee flexion    Knee extension    Ankle dorsiflexion    Ankle plantarflexion    Ankle inversion    Ankle eversion     (Blank rows = not tested)  LOWER EXTREMITY MMT:    MMT Right eval Left eval  Hip flexion 5 4  Hip extension 4+ 4-  Hip abduction    Hip adduction    Hip internal rotation    Hip external rotation    Knee flexion 5 4  Knee extension 5 4+  Ankle dorsiflexion 5 4  Ankle plantarflexion    Ankle inversion    Ankle eversion     (Blank rows = not tested)    FUNCTIONAL TESTS:  5 times sit to stand: 12.68 sec hands on thighs  GAIT: Distance walked: 50 ft Assistive device utilized: None Level of assistance: Modified  independence Comments: slight dec gait speed  TREATMENT DATE: 07/13/24 physical therapy evaluation and HEP instruction                                                                                                                                 PATIENT EDUCATION:  Education details: Patient educated on exam findings, POC, scope of PT, HEP, and discussion of aquatic exercise. Person educated: Patient Education method: Explanation, Demonstration, and Handouts Education comprehension: verbalized understanding, returned demonstration, verbal cues required, and tactile cues required  HOME EXERCISE PROGRAM: Decompression exercises 1-5  ASSESSMENT:  CLINICAL IMPRESSION: Patient is a 75 y.o. female who was seen today for physical therapy evaluation and treatment for M54.59 (ICD-10-CM) - Other low back pain.  Patient demonstrates muscle weakness, reduced ROM, and fascial restrictions which are likely contributing to symptoms of pain and are negatively impacting patient ability to perform ADLs and functional mobility tasks. Patient will benefit from  skilled physical therapy services to address these deficits to reduce pain and improve level of function with ADLs and functional mobility tasks.   OBJECTIVE IMPAIRMENTS: Abnormal gait, decreased activity tolerance, decreased balance, difficulty walking, decreased ROM, decreased strength, increased fascial restrictions, impaired perceived functional ability, and pain.   ACTIVITY LIMITATIONS: carrying, lifting, bending, sitting, standing, dressing, and locomotion level  PARTICIPATION LIMITATIONS: meal prep, cleaning, laundry, shopping, and community activity  REHAB POTENTIAL: Good  CLINICAL DECISION MAKING: Evolving/moderate complexity  EVALUATION COMPLEXITY: Moderate   GOALS: Goals reviewed with patient? No  SHORT TERM GOALS: Target date: 07/27/2024  patient will be independent with initial HEP and compliant with HEP 3-4 times a week    Baseline: Goal status: INITIAL  2.  Patient will report 30% improvement overall  Baseline:  Goal status: INITIAL   LONG TERM GOALS: Target date: 08/10/2024  Patient will be independent in self management strategies to improve quality of life and functional outcomes.  Baseline:  Goal status: INITIAL  2.  Patient will report 50% improvement overall  Baseline:  Goal status: INITIAL  3.   Patient will increase left leg MMT's to 4+ to 5/5 to allow navigation of steps without gait deviation or loss of balance  Baseline:  Goal status: INITIAL  4.  Patient will improve Modified score by 5 points to demonstrate improved perceived function  Baseline: 15/50 Goal status: INITIAL  5.  Patient will improve 5 times sit to stand score to 10 sec or less to demonstrate improved functional mobility and increased leg strength.    Baseline: 12.68 Goal status: INITIAL  PLAN:  PT FREQUENCY: 1-2x/week  PT DURATION: 4 weeks  PLANNED INTERVENTIONS: 97164- PT Re-evaluation, 97110-Therapeutic exercises, 97530- Therapeutic activity, 97112- Neuromuscular re-education, 97535- Self Care, [id number]- Manual therapy, [id number]- Gait training, [phone]- Orthotic Fit/training, [phone]- Canalith repositioning, [id number]- Aquatic Therapy, [phone]- Splinting, [phone]- Wound care (first 20 sq cm), 97598- Wound care (each additional 20 sq cm)Patient/Family education, Balance training, Stair training, Taping, Dry Needling, Joint mobilization, Joint manipulation, Spinal manipulation, Spinal mobilization, Scar mobilization, and DME instructions. SABRA  PLAN FOR NEXT SESSION: Review HEP and goals; check SLS; progress decompression exercises for HEP; postural strengthening, patient leaving to travel in 2 weeks for a 2 week vacation.   11:11 AM, August 04, 2024 Jason Frisbee Small Allex [place]oint MPT [facility] [place] [phone] [phone]   Managed Medicaid Authorization Request Treatment Start Date: 08/04/2024  Visit Dx Codes: M54.50,  M62.81, M54.16  Functional Tool Score: Mddified Oswestry 15/50; 30%  For all possible CPT codes, reference the Planned Interventions line above.     Check all conditions that are expected to impact treatment: {Conditions expected to impact treatment:None of these apply   If treatment provided at initial evaluation, no treatment charged due to lack of authorization.      "

## 2024-07-13 ENCOUNTER — Ambulatory Visit (HOSPITAL_COMMUNITY): Attending: Internal Medicine

## 2024-07-13 ENCOUNTER — Other Ambulatory Visit: Payer: Self-pay

## 2024-07-13 DIAGNOSIS — M6281 Muscle weakness (generalized): Secondary | ICD-10-CM | POA: Diagnosis present

## 2024-07-13 DIAGNOSIS — M545 Low back pain, unspecified: Secondary | ICD-10-CM | POA: Insufficient documentation

## 2024-07-13 DIAGNOSIS — M5416 Radiculopathy, lumbar region: Secondary | ICD-10-CM | POA: Diagnosis present

## 2024-07-21 ENCOUNTER — Ambulatory Visit (HOSPITAL_COMMUNITY)

## 2024-07-21 DIAGNOSIS — M6281 Muscle weakness (generalized): Secondary | ICD-10-CM

## 2024-07-21 DIAGNOSIS — M545 Low back pain, unspecified: Secondary | ICD-10-CM

## 2024-07-21 DIAGNOSIS — M5416 Radiculopathy, lumbar region: Secondary | ICD-10-CM

## 2024-07-21 NOTE — Therapy (Signed)
 " OUTPATIENT PHYSICAL THERAPY THORACOLUMBAR TREATMENT   Patient Name: Nicole Newman MRN: [id number] DOB:09/22/1950, 74 y.o., female Today's Date: 07/21/2024  END OF SESSION:  PT End of Session - 07/21/24 0732     Visit Number 2    Number of Visits 4    Date for Recertification  08/10/24    Authorization Type BCBS Medicare    PT Start Time 0728    PT Stop Time 0806    PT Time Calculation (min) 38 min    Activity Tolerance Patient tolerated treatment well    Behavior During Therapy [facility] for tasks assessed/performed          Past Medical History:  Diagnosis Date   Anemia    Arthritis    Depression    Dysrhythmia    PVC'S 2017   Family history of adverse reaction to anesthesia    states mother had small MI during anesthesia   GERD (gastroesophageal reflux disease)    High cholesterol    Hypertension    states under control with med., has been on med. x 2.5 yr.   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   BREAST REDUCTION SURGERY     CHOLECYSTECTOMY N/A 05/06/2022   Procedure: LAPAROSCOPIC CHOLECYSTECTOMY;  Surgeon: Dasie Leonor CROME, MD;  Location: [facility];  Service: General;  Laterality: N/A;   CHONDROPLASTY Left 02/27/2016   Procedure: CHONDROPLASTY;  Surgeon: Lamar Millman, MD;  Location: [facility];  Service: Orthopedics;  Laterality: Left;   DIAGNOSTIC LAPAROSCOPY     ESOPHAGEAL DILATION     ESOPHAGOGASTRODUODENOSCOPY (EGD) WITH PROPOFOL  N/A 04/01/2019   Procedure: ESOPHAGOGASTRODUODENOSCOPY (EGD) WITH PROPOFOL ;  Surgeon: Celestia Agent, MD;  Location: [facility];  Service: Endoscopy;  Laterality: N/A;   EXCISION MORTON'S  NEUROMA Right    foot   KNEE ARTHROSCOPY WITH LATERAL MENISECTOMY Left 02/27/2016   Procedure: KNEE ARTHROSCOPY WITH LATERAL and MEDIAL MENISCECTOMY, left;  Surgeon: Lamar Millman, MD;  Location: [facility];  Service: Orthopedics;  Laterality: Left;   LAPAROSCOPIC BILATERAL SALPINGO OOPHERECTOMY Bilateral 07/06/2018   Procedure: LAPAROSCOPIC BILATERAL SALPINGO OOPHORECTOMY;  Surgeon: Ward, Mitzie BROCKS, MD;  Location: [facility];  Service: Gynecology;  Laterality: Bilateral;   LAPAROSCOPIC HYSTERECTOMY N/A 07/06/2018   Procedure: HYSTERECTOMY TOTAL LAPAROSCOPIC;  Surgeon: Ward, Mitzie BROCKS, MD;  Location: [facility];  Service: Gynecology;  Laterality: N/A;   REDUCTION MAMMAPLASTY Bilateral 2004   SAVORY DILATION N/A 04/01/2019   Procedure: SAVORY DILATION;  Surgeon: Celestia Agent, MD;  Location: [facility];  Service: Endoscopy;  Laterality: N/A;   SKIN CANCER EXCISION     Patient Active Problem List   Diagnosis Date Noted   Hypertension    GERD (gastroesophageal reflux disease)    Family history of adverse reaction to anesthesia    Medial meniscus tear 01/30/2016   Lateral meniscal tear left knee 01/30/2016   Palpitations 07/31/2015   Ventricular premature beats 07/31/2015   Tachycardia, unspecified 07/31/2015    PCP: Dwight Trula SQUIBB, MD  REFERRING PROVIDER: Dwight Trula SQUIBB, MD  REFERRING DIAG: M54.59 (ICD-10-CM) - Other low  back pain  Rationale for Evaluation and Treatment: Rehabilitation  THERAPY DIAG:  Low back pain, unspecified back pain laterality, unspecified chronicity, unspecified whether sciatica present  Muscle weakness (generalized)  Radiculopathy, lumbar region  ONSET DATE: chronic  SUBJECTIVE:                                                                                                                                                                                           SUBJECTIVE STATEMENT: Feeling better overall; more aware of her positioning.   Trying to be compliant with HEP; 1/10 pain left glute this morning  EVAL:Chronic and Intermittent back pain; some numbness in left outer hip and thigh; seems to be generally more on left side; overall gets worse if she is doing a lot of housework or bending, squatting.  noticed some pain in legs if sits for prolonged period of time; trouble balancing with lifting left leg for dressing tasks. would like to get back into the [employer]  PERTINENT HISTORY:  History of bulging disc in the past Eye surgery this past fall and had to lie flat for several days  PAIN:  Are you having pain? Yes: NPRS scale: 1-2/10 Pain location: low back and both legs left > right Pain description: sore, and aching Aggravating factors: squatting or lots of cleaning Relieving factors: Advil   PRECAUTIONS: None  RED FLAGS: None   WEIGHT BEARING RESTRICTIONS: No  FALLS:  Has patient fallen in last 6 months? 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  DIAGNOSTIC FINDINGS:  None current  PATIENT SURVEYS:  Modified Oswestry:  MODIFIED OSWESTRY DISABILITY SCALE  Date: 07/13/2024 Score                                Total 15/50;30%   Interpretation of scores: Score Category Description  0-20% Minimal Disability The patient can cope with most living activities. Usually no treatment is indicated apart from advice on lifting, sitting and exercise  21-40% Moderate Disability The patient experiences more pain and difficulty with sitting, lifting and standing. Travel and social life are more difficult and they may be disabled from work. Personal care, sexual activity and sleeping are not grossly affected, and the patient can usually be managed by conservative means  41-60% Severe Disability Pain remains  the main problem in this group, but activities of daily living are affected. These  patients require a detailed investigation  61-80% Crippled Back pain impinges on all aspects of the patients life. Positive intervention is required  81-100% Bed-bound These patients are either bed-bound or exaggerating their symptoms  Bluford FORBES Zoe DELENA Karon DELENA, et al. Surgery versus conservative management of stable thoracolumbar fracture: the PRESTO feasibility RCT. [place] ([place]): [employer]; 2021 Nov. [facility] Technology Assessment, No. 25.62.) Appendix 3, Oswestry Disability Index category descriptors. Available from: Findjewelers.cz  Minimally Clinically Important Difference (MCID) = 12.8%  COGNITION: Overall cognitive status: Within functional limits for tasks assessed     SENSATION: Some numbness left hip occassionally  MUSCLE LENGTH: Hamstrings: check next visit  POSTURE: rounded shoulders and forward head  PALPATION: General soreness left low back  LUMBAR ROM: * pain  AROM eval  Flexion Fingertips to top of ankles  Extension 50% available  Right lateral flexion   Left lateral flexion   Right rotation   Left rotation    (Blank rows = not tested)  LOWER EXTREMITY ROM:     Active  Right eval Left eval  Hip flexion    Hip extension    Hip abduction    Hip adduction    Hip internal rotation    Hip external rotation    Knee flexion    Knee extension    Ankle dorsiflexion    Ankle plantarflexion    Ankle inversion    Ankle eversion     (Blank rows = not tested)  LOWER EXTREMITY MMT:    MMT Right eval Left eval  Hip flexion 5 4  Hip extension 4+ 4-  Hip abduction    Hip adduction    Hip internal rotation    Hip external rotation    Knee flexion 5 4  Knee extension 5 4+  Ankle dorsiflexion 5 4  Ankle plantarflexion    Ankle inversion    Ankle eversion     (Blank rows = not tested)    FUNCTIONAL TESTS:  5 times sit  to stand: 12.68 sec hands on thighs  GAIT: Distance walked: 50 ft Assistive device utilized: None Level of assistance: Modified independence Comments: slight dec gait speed  TREATMENT DATE: 07/21/24  Review of HEP and goals Decompression exercises 1-5; 5 hold x 8 reps Decompression exercises with theraband 1-4 Updated HEP      07/13/24 physical therapy evaluation and HEP instruction                                                                                                                                 PATIENT EDUCATION:  Education details: Patient educated on exam findings, POC, scope of PT, HEP, and discussion of aquatic exercise. Person educated: Patient Education method: Explanation, Demonstration, and Handouts Education comprehension: verbalized understanding, returned demonstration, verbal cues required, and tactile cues required  HOME EXERCISE PROGRAM: Decompression exercises 1-5  ASSESSMENT:  CLINICAL IMPRESSION: Today's session started with review of HEP and goals.  Patient verbalizes agreement with set rehab goals.  Supine with moist heat to low back x 5' to decrease pain and improve soft tissue extensibility.  Continued with decompression exercises and progressed.  Patient leaving for an international trip on Friday so will be gone a couple weeks. Will call back when she returns to make more visits if needed.   Patient will benefit from continued skilled therapy services to address deficits and promote return to optimal function.     EVAL:Patient is a 74 y.o. female who was seen today for physical therapy evaluation and treatment for M54.59 (ICD-10-CM) - Other low back pain.  Patient demonstrates muscle weakness, reduced ROM, and fascial restrictions which are likely contributing to symptoms of pain and are negatively impacting patient ability to perform ADLs and functional mobility tasks. Patient will benefit from skilled physical therapy services to address these  deficits to reduce pain and improve level of function with ADLs and functional mobility tasks.   OBJECTIVE IMPAIRMENTS: Abnormal gait, decreased activity tolerance, decreased balance, difficulty walking, decreased ROM, decreased strength, increased fascial restrictions, impaired perceived functional ability, and pain.   ACTIVITY LIMITATIONS: carrying, lifting, bending, sitting, standing, dressing, and locomotion level  PARTICIPATION LIMITATIONS: meal prep, cleaning, laundry, shopping, and community activity  REHAB POTENTIAL: Good  CLINICAL DECISION MAKING: Evolving/moderate complexity  EVALUATION COMPLEXITY: Moderate   GOALS: Goals reviewed with patient? No  SHORT TERM GOALS: Target date: 07/27/2024  patient will be independent with initial HEP and compliant with HEP 3-4 times a week   Baseline: Goal status: in progress  2.  Patient will report 30% improvement overall  Baseline:  Goal status: in progress   LONG TERM GOALS: Target date: 08/10/2024  Patient will be independent in self management strategies to improve quality of life and functional outcomes.  Baseline:  Goal status: in progress  2.  Patient will report 50% improvement overall  Baseline:  Goal status: in progress  3.   Patient will increase left leg MMT's to 4+ to 5/5 to allow navigation of steps without gait deviation or loss of balance  Baseline:  Goal status: in progress  4.  Patient will improve Modified score by 5 points to demonstrate improved perceived function  Baseline: 15/50 Goal status: in progress  5.  Patient will improve 5 times sit to stand score to 10 sec or less to demonstrate improved functional mobility and increased leg strength.    Baseline: 12.68 Goal status: in progress  PLAN:  PT FREQUENCY: 1-2x/week  PT DURATION: 4 weeks  PLANNED INTERVENTIONS: 97164- PT Re-evaluation, 97110-Therapeutic exercises, 97530- Therapeutic activity, 97112- Neuromuscular re-education,  97535- Self Care, 02859- Manual therapy, [id number]- Gait training, [phone]- Orthotic Fit/training, [phone]- Canalith repositioning, [id number]- Aquatic Therapy, [phone]- Splinting, [phone]- Wound care (first 20 sq cm), 97598- Wound care (each additional 20 sq cm)Patient/Family education, Balance training, Stair training, Taping, Dry Needling, Joint mobilization, Joint manipulation, Spinal manipulation, Spinal mobilization, Scar mobilization, and DME instructions. SABRA  PLAN FOR NEXT SESSION:  check SLS; progress decompression exercises for HEP; postural strengthening, patient leaving to travel in 2 weeks for a 2 week vacation.   8:10 AM, 07/21/24 Aprel Egelhoff Small Maddelynn Moosman MPT [facility] [place] [phone] Ph:[phone]   Managed Medicaid Authorization Request Treatment Start Date: 07-14-2024  Visit Dx Codes: M54.50, M62.81, M54.16  Functional Tool Score: Mddified Oswestry 15/50; 30%  For all possible CPT codes, reference the Planned Interventions line above.     Check all conditions that are expected to impact treatment: {Conditions expected  to impact treatment:None of these apply   If treatment provided at initial evaluation, no treatment charged due to lack of authorization.      "
